# Patient Record
Sex: Male | Born: 1945 | Race: White | Hispanic: No | Marital: Married | Smoking: Former smoker
Health system: Southern US, Community
[De-identification: ages and names within clinical notes are randomized; demographics above are authoritative.]

## PROBLEM LIST (undated history)

## (undated) DIAGNOSIS — G4730 Sleep apnea, unspecified: Secondary | ICD-10-CM

## (undated) DIAGNOSIS — J310 Chronic rhinitis: Secondary | ICD-10-CM

## (undated) DIAGNOSIS — I2510 Atherosclerotic heart disease of native coronary artery without angina pectoris: Secondary | ICD-10-CM

## (undated) DIAGNOSIS — I82402 Acute embolism and thrombosis of unspecified deep veins of left lower extremity: Secondary | ICD-10-CM

## (undated) DIAGNOSIS — K219 Gastro-esophageal reflux disease without esophagitis: Secondary | ICD-10-CM

## (undated) DIAGNOSIS — N401 Enlarged prostate with lower urinary tract symptoms: Secondary | ICD-10-CM

## (undated) DIAGNOSIS — E119 Type 2 diabetes mellitus without complications: Secondary | ICD-10-CM

## (undated) DIAGNOSIS — N138 Other obstructive and reflux uropathy: Secondary | ICD-10-CM

## (undated) DIAGNOSIS — I480 Paroxysmal atrial fibrillation: Secondary | ICD-10-CM

## (undated) DIAGNOSIS — K5790 Diverticulosis of intestine, part unspecified, without perforation or abscess without bleeding: Secondary | ICD-10-CM

## (undated) DIAGNOSIS — Z9189 Other specified personal risk factors, not elsewhere classified: Secondary | ICD-10-CM

## (undated) DIAGNOSIS — I2692 Saddle embolus of pulmonary artery without acute cor pulmonale: Secondary | ICD-10-CM

## (undated) DIAGNOSIS — E669 Obesity, unspecified: Secondary | ICD-10-CM

## (undated) DIAGNOSIS — H269 Unspecified cataract: Secondary | ICD-10-CM

## (undated) DIAGNOSIS — Z1159 Encounter for screening for other viral diseases: Secondary | ICD-10-CM

## (undated) DIAGNOSIS — H903 Sensorineural hearing loss, bilateral: Secondary | ICD-10-CM

## (undated) DIAGNOSIS — R972 Elevated prostate specific antigen [PSA]: Secondary | ICD-10-CM

## (undated) DIAGNOSIS — G4733 Obstructive sleep apnea (adult) (pediatric): Secondary | ICD-10-CM

## (undated) DIAGNOSIS — M47816 Spondylosis without myelopathy or radiculopathy, lumbar region: Secondary | ICD-10-CM

## (undated) DIAGNOSIS — M1990 Unspecified osteoarthritis, unspecified site: Secondary | ICD-10-CM

## (undated) DIAGNOSIS — R911 Solitary pulmonary nodule: Secondary | ICD-10-CM

## (undated) DIAGNOSIS — E7800 Pure hypercholesterolemia, unspecified: Secondary | ICD-10-CM

## (undated) DIAGNOSIS — Z9989 Dependence on other enabling machines and devices: Secondary | ICD-10-CM

## (undated) DIAGNOSIS — I213 ST elevation (STEMI) myocardial infarction of unspecified site: Secondary | ICD-10-CM

## (undated) DIAGNOSIS — I10 Essential (primary) hypertension: Secondary | ICD-10-CM

## (undated) DIAGNOSIS — E66812 Obesity, class 2: Secondary | ICD-10-CM

## (undated) DIAGNOSIS — N402 Nodular prostate without lower urinary tract symptoms: Secondary | ICD-10-CM

## (undated) DIAGNOSIS — D126 Benign neoplasm of colon, unspecified: Secondary | ICD-10-CM

## (undated) HISTORY — DX: Dependence on other enabling machines and devices: Z99.89

## (undated) HISTORY — DX: Benign neoplasm of colon, unspecified: D12.6

## (undated) HISTORY — PX: TONSILLECTOMY: SUR1361

## (undated) HISTORY — DX: Sleep apnea, unspecified: G47.30

## (undated) HISTORY — DX: Saddle embolus of pulmonary artery without acute cor pulmonale: I26.92

## (undated) HISTORY — DX: Sensorineural hearing loss, bilateral: H90.3

## (undated) HISTORY — PX: FRACTURE SURGERY: SHX138

## (undated) HISTORY — DX: Benign prostatic hyperplasia with lower urinary tract symptoms: N13.8

## (undated) HISTORY — DX: Type 2 diabetes mellitus without complications: E11.9

## (undated) HISTORY — DX: Gastro-esophageal reflux disease without esophagitis: K21.9

## (undated) HISTORY — DX: Nodular prostate without lower urinary tract symptoms: N40.2

## (undated) HISTORY — DX: Unspecified cataract: H26.9

## (undated) HISTORY — DX: Acute embolism and thrombosis of unspecified deep veins of left lower extremity: I82.402

## (undated) HISTORY — DX: Solitary pulmonary nodule: R91.1

## (undated) HISTORY — PX: INGUINAL HERNIA REPAIR: SUR1180

## (undated) HISTORY — DX: Pure hypercholesterolemia, unspecified: E78.00

## (undated) HISTORY — PX: CORONARY ARTERY BYPASS GRAFT: SHX141

## (undated) HISTORY — DX: Paroxysmal atrial fibrillation: I48.0

## (undated) HISTORY — DX: Diverticulosis of intestine, part unspecified, without perforation or abscess without bleeding: K57.90

## (undated) HISTORY — DX: Obstructive sleep apnea (adult) (pediatric): G47.33

## (undated) HISTORY — DX: ST elevation (STEMI) myocardial infarction of unspecified site: I21.3

## (undated) HISTORY — PX: TRANSTHORACIC ECHOCARDIOGRAM: SHX275

## (undated) HISTORY — DX: Chronic rhinitis: J31.0

## (undated) HISTORY — DX: Spondylosis without myelopathy or radiculopathy, lumbar region: M47.816

## (undated) HISTORY — DX: Essential (primary) hypertension: I10

## (undated) HISTORY — DX: Other specified personal risk factors, not elsewhere classified: Z91.89

## (undated) HISTORY — DX: Obesity, class 2: E66.812

## (undated) HISTORY — DX: Other obstructive and reflux uropathy: N40.1

## (undated) HISTORY — DX: Unspecified osteoarthritis, unspecified site: M19.90

## (undated) HISTORY — DX: Atherosclerotic heart disease of native coronary artery without angina pectoris: I25.10

## (undated) HISTORY — DX: Obesity, unspecified: E66.9

## (undated) HISTORY — PX: COLONOSCOPY: SHX174

## (undated) HISTORY — DX: Elevated prostate specific antigen (PSA): R97.20

## (undated) HISTORY — PX: OTHER SURGICAL HISTORY: SHX169

## (undated) HISTORY — DX: Other specified personal risk factors, not elsewhere classified: Z11.59

---

## 2002-04-26 ENCOUNTER — Ambulatory Visit (HOSPITAL_COMMUNITY): Admission: RE | Admit: 2002-04-26 | Discharge: 2002-04-26 | Payer: Self-pay | Admitting: Gastroenterology

## 2003-08-03 ENCOUNTER — Encounter: Payer: Self-pay | Admitting: Emergency Medicine

## 2003-08-03 ENCOUNTER — Emergency Department (HOSPITAL_COMMUNITY): Admission: EM | Admit: 2003-08-03 | Discharge: 2003-08-03 | Payer: Self-pay | Admitting: Emergency Medicine

## 2003-08-12 ENCOUNTER — Ambulatory Visit (HOSPITAL_COMMUNITY): Admission: RE | Admit: 2003-08-12 | Discharge: 2003-08-13 | Payer: Self-pay | Admitting: Orthopedic Surgery

## 2003-10-09 ENCOUNTER — Encounter: Admission: RE | Admit: 2003-10-09 | Discharge: 2003-10-30 | Payer: Self-pay | Admitting: Orthopedic Surgery

## 2003-10-16 ENCOUNTER — Ambulatory Visit (HOSPITAL_BASED_OUTPATIENT_CLINIC_OR_DEPARTMENT_OTHER): Admission: RE | Admit: 2003-10-16 | Discharge: 2003-10-16 | Payer: Self-pay | Admitting: Internal Medicine

## 2003-10-16 ENCOUNTER — Encounter: Payer: Self-pay | Admitting: Internal Medicine

## 2004-03-11 ENCOUNTER — Ambulatory Visit (HOSPITAL_BASED_OUTPATIENT_CLINIC_OR_DEPARTMENT_OTHER): Admission: RE | Admit: 2004-03-11 | Discharge: 2004-03-11 | Payer: Self-pay | Admitting: General Surgery

## 2004-08-31 ENCOUNTER — Encounter
Admission: RE | Admit: 2004-08-31 | Discharge: 2004-11-29 | Payer: Self-pay | Admitting: Physical Medicine & Rehabilitation

## 2004-08-31 ENCOUNTER — Ambulatory Visit: Payer: Self-pay | Admitting: Physical Medicine & Rehabilitation

## 2004-10-01 ENCOUNTER — Ambulatory Visit (HOSPITAL_COMMUNITY): Admission: RE | Admit: 2004-10-01 | Discharge: 2004-10-01 | Payer: Self-pay | Admitting: Chiropractic Medicine

## 2005-03-01 ENCOUNTER — Ambulatory Visit (HOSPITAL_COMMUNITY): Admission: RE | Admit: 2005-03-01 | Discharge: 2005-03-01 | Payer: Self-pay | Admitting: Infectious Diseases

## 2006-07-10 ENCOUNTER — Ambulatory Visit: Payer: Self-pay | Admitting: Physical Medicine & Rehabilitation

## 2006-07-10 ENCOUNTER — Encounter
Admission: RE | Admit: 2006-07-10 | Discharge: 2006-10-08 | Payer: Self-pay | Admitting: Physical Medicine & Rehabilitation

## 2006-08-29 ENCOUNTER — Encounter
Admission: RE | Admit: 2006-08-29 | Discharge: 2006-09-06 | Payer: Self-pay | Admitting: Physical Medicine & Rehabilitation

## 2006-09-07 ENCOUNTER — Ambulatory Visit: Payer: Self-pay | Admitting: Physical Medicine & Rehabilitation

## 2006-10-30 ENCOUNTER — Encounter
Admission: RE | Admit: 2006-10-30 | Discharge: 2007-01-28 | Payer: Self-pay | Admitting: Physical Medicine & Rehabilitation

## 2009-11-04 ENCOUNTER — Ambulatory Visit: Payer: Self-pay | Admitting: Internal Medicine

## 2009-11-04 DIAGNOSIS — G4733 Obstructive sleep apnea (adult) (pediatric): Secondary | ICD-10-CM | POA: Insufficient documentation

## 2009-11-06 ENCOUNTER — Encounter: Payer: Self-pay | Admitting: Internal Medicine

## 2009-11-08 DIAGNOSIS — J310 Chronic rhinitis: Secondary | ICD-10-CM | POA: Insufficient documentation

## 2010-12-15 ENCOUNTER — Ambulatory Visit
Admission: RE | Admit: 2010-12-15 | Discharge: 2010-12-15 | Payer: Self-pay | Source: Home / Self Care | Attending: Internal Medicine | Admitting: Internal Medicine

## 2010-12-30 NOTE — Assessment & Plan Note (Signed)
Summary: per reminder letter/mhh   Primary Provider/Referring Provider:  Marinda Elk  CC:  Yearly Follow up visit-OSA and Rhinitis.Marland Kitchen  History of Present Illness:  History of Present Illness: Nov 19, 2009. 65yoM self referred for hx obstructive sleep apnea. Originally diagnosed 11/18.04, with moderate OSA, NPSG RDI 51/hr, loud snoring, desat to 83%. CPAP then titrated to 11 cwp, RDI 3.2. He has continued using his old machine at 65 cwp/ [facility], but now needs replacement. As near as he can tell this pressure remains effective, without breakthrough snoring. He may be tireder in daytime. Bedtime is 10-11PM, latency 5-15 minutes, waking 3-5 times before up 5-530AM. Weight is about the same. Tosses and turns, but denies complex parasomnias.  December 15, 2010- OSA Nurse-CC: Yearly Follow up visit-OSA and Rhinitis. CPAP is at 11 , [facility] Now 6 years with CPAP. Restles sleeper- often dislodges mask and the leaks disturb him. Not up a lot at night. Not told of snoring through. Old mattress. Cool bedroom.    Preventive Screening-Counseling & Management  Alcohol-Tobacco     Smoking Status: quit  Current Medications (verified): 1)  Cpap .... Ahc Set On 11 2)  Aspirin 81 Mg Tbec (Aspirin) .... Take 1 By Mouth Once Daily  Allergies (verified): No Known Drug Allergies  Past History:  Past Medical History: Last updated: 19-Nov-2009 Obstructive sleep Apnea- NPSG 10/16/03, RDI 51/hr Rhinitis  Past Surgical History: Last updated: November 19, 2009 Inguinal hernia Right clavicle fracture/ repair Tonsillectomy  Family History: Last updated: 11-19-2009 Father died- unknown cause mother-living  Social History: Last updated: 19-Nov-2009 Married, one son Patient states former smoker. Quit in 1960's [employer]  Risk Factors: Smoking Status: quit (12/15/2010)  Review of Systems      See HPI  The patient denies shortness of breath with activity,  shortness of breath at rest, productive cough, non-productive cough, coughing up blood, chest pain, irregular heartbeats, acid heartburn, indigestion, loss of appetite, weight change, abdominal pain, difficulty swallowing, sore throat, tooth/dental problems, headaches, nasal congestion/difficulty breathing through nose, sneezing, itching, ear ache, and change in color of mucus.    Vital Signs:  Patient profile:   65 year old male Weight:      243 pounds O2 Sat:      95 % on Room air Pulse rate:   75 / minute BP sitting:   124 / 80  (right arm) Cuff size:   large  Vitals Entered By: Reynaldo Minium CMA (December 15, 2010 9:20 AM)  O2 Flow:  Room air CC: Yearly Follow up visit-OSA and Rhinitis.   Physical Exam  Additional Exam:  General: A/Ox3; pleasant and cooperative, NAD, overweight, alert SKIN: no rash, lesions NODES: no lymphadenopathy HEENT: [place]/AT, EOM- WNL, Conjuctivae- clear, PERRLA, TM-WNL, Nose- septal deviation, Throat- clear and wnl, Mallampati  III, thick tongue NECK: Supple w/ fair ROM, JVD- none, normal carotid impulses w/o bruits Thyroid- normal to palpation CHEST: Clear to P&A HEART: RRR, no m/g/r heard ABDOMEN: overweight ZOX:WRUE, nl pulses, no edema  NEURO: Grossly intact to observation      Impression & Recommendations:  Problem # 1:  OBSTRUCTIVE SLEEP APNEA (ICD-327.23) Insomnia pattern separate from OSA. Restless sleeper  by nature. We don't identify restless legs  or inadequate CPAP.  We worked through options on mask, mattress,  etc.  We will let him try a short half-lfe sleep aide for occasional use.   Problem # 2:  RHINITIS (ICD-472.0) Assessment: Comment Only  Medications Added to Medication List This  Visit: 1)  Aspirin 81 Mg Tbec (Aspirin) .... Take 1 by mouth once daily 2)  Sonata 10 Mg Caps (Zaleplon) .Marland Kitchen.. 1 for sleep if needed  Other Orders: Est. Patient Level III (60454)  Patient Instructions: 1)  Please schedule a follow-up appointment  in 1 year. 2)  Try short half-life sleep med as an occasional option to help you sleep more soundly.  3)  We will continue CPAP at 11 Prescriptions: SONATA 10 MG CAPS (ZALEPLON) 1 for sleep if needed  #20 x 5   Entered and Authorized by:   Waymon Budge MD   Signed by:   Waymon Budge MD on 12/15/2010   Method used:   Print then Give to Patient   RxID:   [id number]

## 2011-04-15 NOTE — Op Note (Signed)
NAME:  Wayne Lamb, Wayne Lamb                          ACCOUNT NO.:  [id number]   MEDICAL RECORD NO.:  [id number]                   PATIENT TYPE:  AMB   LOCATION:  DSC                                  FACILITY:  [facility]   PHYSICIAN:  Jimmye Norman III, M.D.               DATE OF BIRTH:  09-Jun-1946   DATE OF PROCEDURE:  03/11/2004  DATE OF DISCHARGE:                                 OPERATIVE REPORT   PREOPERATIVE DIAGNOSIS:  Left inguinal hernia.   POSTOPERATIVE DIAGNOSIS:  Large direct left inguinal hernia without indirect  component.   PROCEDURE:  Left inguinal hernia repair with mesh.   SURGEON:  Jimmye Norman, M.D.   ASSISTANT:  None.   ANESTHESIA:  General with laryngeal airway.   ESTIMATED BLOOD LOSS:  Less than 20 mL.   COMPLICATIONS:  None.   CONDITION:  Stable.   FINDINGS:  The patient had a large direct hernia without an indirect  component.   INDICATIONS FOR PROCEDURE:  The patient is a 65 year old with a large left  inguinal hernia who comes in now for elective repair.   OPERATION:  The patient was taken to the operating room and placed on the  table in supine position.  After an adequate general laryngeal airway  anesthetic was administered, he was prepped and draped in the usual sterile  manner exposing his left groin.  A 4.5 cm transverse curvilinear incision  was made at the level of the superficial ring.  It was taken down to and  through the subcu tissue down through Scarpa's fascia to the external  oblique fascia.  It was easily seen a large hernia sac protruding through  the superficial ring.  We opened up the external oblique fascia through the  superficial ring using 15 blade and Metzenbaum scissors.  We isolated the  spermatic cord away from the large sac which was medial to the cord, itself.  We dissected down to the base of the cord where no indirect sac was noted.  We cleaned out the reflected portion of the inguinal ligament and also the  conjoined tendon  so that our repair could be clean.  We imbricated the  direct sac onto itself by pushing it back into the canal and then using  interrupted 0 Ethibond sutures to keep it tucked in place.  We then  oversewed an oval piece of mesh measuring approximately 5 by 3 cm in size to  the conjoined tendon anteromedially and the reflected portion of the  inguinal ligament inferolaterally.  This was done using a running 0 Prolene  suture.  The hernia sac remained intact and repair remained intact after  suturing it and we tied off both Prolenes at the internal ring by the cord.  The ilioinguinal nerve was transected as we opened the superficial ring  through the external oblique fascia.  We dissected it down to the tubercle  and also  at its base.  There was no entrapment at the internal ring.  Once  we had the mesh which had been soaked in antibiotic solution implanted, we  placed the cord back into the canal and then reapproximated the external  oblique fascia using a running 3-0 Vicryl suture.  Once this was done,  Scarpa's fascia was reapproximated using  interrupted 3-0 Vicryl sutures and then the skin was closed using a running  subcuticular stitch of 4-0 Prolene.  0.5% Marcaine without epinephrine was  injected into the subcu and also at the anterior superior iliac spine in a  fan pattern.  All needle counts, sponge counts, and instrument counts were  correct.  A sterile dressing was applied.                                               Kathrin Ruddy, M.D.    JW/MEDQ  D:  03/11/2004  T:  03/11/2004  Job:  [id number]

## 2011-04-15 NOTE — Assessment & Plan Note (Signed)
[facility] NOTE   DATE OF VISIT:  August 08, 2006.  Date of previous visit July 10, 2006.   HISTORY:  Patient is a 65 year old male with left sided low back pain,  lumbar spinal stenosis, facet arthropathy, left L4 radiculopathy.   He has not been able to exercise much because of his travel schedule and  states that he has had some exacerbation of pain and is at a 5-7/10 level,  which is actually in the same range as prior.  His sleep is good.  Pain is  worse during the daytime.  He has a warm-up phase where first thing in the  morning he feels stiff but then feels better.  His left lower extremity pain  comes and goes and wraps around to the side of the leg.   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   PHYSICAL EXAMINATION:  VITAL SIGNS:  Blood pressure 143/79, pulse 71,  respirations 18, oxygen saturation 94% on room air.  MUSCULOSKELETAL:  Back has mild tenderness to palpation of lumbar  paraspinal's. He has good spine range of motion, flexion and extension of  about 75%, however, he does have some pain going down his left leg laterally  with forward flexion.   He has good hip internal and external rotation bilaterally.  Normal strength  in the lower extremities. He has normal deep tendon reflexes.  His gait is  without evidence of toe drag or knee instability.   IMPRESSION:  1. Lumbar spinal stenosis.  2. Lumbar facet arthropathy.  3. Left L4 radiculopathy.   PLAN:  1. Given his symptomatology is slightly increased, despite being on      ibuprofen 800 mg t.i.d., we will have him stop this and take a Medrol      Dosepak.  2. Start physical therapy lumbar stabilization program, four visits.  3. Should above not be helpful, consider lumbar epidural steroid      injection, L4 transforaminal.  4. Encourage establishment of a regular exercise program including aerobic      conditioning and core stabilization  exercises.      Erick Colace, M.D.  Electronically Signed     AEK/MedQ  D:  08/08/2006 10:30:41  T:  08/08/2006 16:09:29  Job #:  [id number]

## 2011-04-15 NOTE — Assessment & Plan Note (Signed)
PAIN AND REHABILITATION FOLLOWUP NOTE   DATE OF VISIT:  July 10, 2006.   SUBJECTIVE:  [name] is a 65 year old male who I last saw August 31, 2004 who has had left sided low back pain as well as pain going down his  left leg into his foot.  I saw him and sent him to physical therapy. He did  well and followed up with chiropractic.  He started an exercise program to  try to lose weight.  He started a spin class as well as Systems analyst  class with some overhead activities with weights on a Swedish ball.  Has  done some free weights with biceps and other machines as well, leg presses.  He thinks that he has had some exacerbation of pain because of this.  He  really has not taken much more than non prescription strength ibuprofen.   He had an MRI of the lumbar spine October 01, 2004 showing advanced facet  degenerative changes at L3-L4, L4-L5, L5-S1.  He did have lateral recess  stenosis of L4-L5, shallow disc protrusion and some encroachment upon left  L4.   He has had no change in symptomatology since that time.   His pain is graded at a 6-7 out of 10 and improves with sitting, worse with  standing. 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   SOCIAL HISTORY:  Occasional wine, no smoking.   PHYSICAL EXAMINATION:  VITAL SIGNS:  Blood pressure is 119/81, pulse is 68,  respirations 15, oxygen saturation 96% on room air.  GENERAL APPEARANCE:  No acute distress.  Mood and affect appropriate.  Alert  and oriented x3.  Gait is normal.  MUSCULOSKELETAL:  He has no tenderness to palpation over the lumbar spine.  He does not have any pain with extension or flexion of the spine.  His  favors test is negative.  He has no sensory loss to light touch or pinprick.  He has normal deep tendon reflexes.  Normal gait.  He is able to toe walk  and heel walk.   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   Essentially negative physical examination at this time.   PLAN:  We went over his exercise program.  Have advised that he does  everything in a seated position as much as possible.  He can do some  stationary bicycling but avoid walking or running.   MEDICATIONS:  In terms of medications, will put him on ibuprofen 800 mg  t.i.d. with food.   If he is not much better the next time I see him I would send him back to  physical therapy, review exercise program and also start a Medrol Dosepak  and, failing this, would proceed on to a transepidural steroid injection  left L4.      Erick Colace, M.D.  Electronically Signed     AEK/MedQ  D:  07/10/2006 17:03:32  T:  07/11/2006 09:20:23  Job #:  [id number]

## 2011-04-15 NOTE — Assessment & Plan Note (Signed)
DATE OF LAST VISIT:  August 08, 2006.   A 65 year old male with lumbar spinal stenosis with facet arthropathy, L4  radiculopathy on the left.  He has now got into physical therapy and has had  4 sessions plus some home exercises, and he feels like this is going quite  well.  His pain level last was 5-7/10, now it is down 3-5/10.  He states  that the postural draining has been very helpful for him, and this has  helped as much as anything.  The Medrol Dosepak had some temporary relief.  It was not anymore effective than the ibuprofen.  I have noted that he is no  longer using ibuprofen, whereas he was consistently last visit.   His pain is not interfering with sleep.  It is more in the back than it is  in the lower extremity.  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   PHYSICAL EXAMINATION:  VITAL SIGNS:  His blood pressure is 150/77, pulse 78,  respiratory rate 16, O2 SAT 95% on room air.  GENERAL:  No acute distress and affect appropriate.  BACK:  Had no tenderness to palpation.  He has pain when he goes straight-  legged forward flexion, but this is diminished when he comes back up.  He  does have pain with extension as well and has about 50% range of extension  where it is 100% full flexion.   Gait is without any toe drag or knee instability.   IMPRESSION:  1. Lumbar spinal stenosis.  2. Lumbar facet arthropathy.  3. Left L4 radiculopathy.   He does have some nerve retention signs but overall doing well.   PLAN:  1. Continue his home exercise program as delineated by physical therapy.  2. He had an exacerbation of his left lower extremity pain.  Will do a      left L4 trans-foraminal and epidural steroid injection.  3. Use p.r.n. ibuprofen for his bad days.  4. Will see him back in approximately 2 months.      Erick Colace, M.D.  Electronically Signed     AEK/MedQ  D:  09/07/2006 13:27:36  T:  09/09/2006 00:38:29   Job #:  [id number]   cc:   Andria Frames  Fax: [phone]   Doris Cheadle. Foy Guadalajara, M.D.  Fax: [phone]
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
## 2011-04-15 NOTE — Group Therapy Note (Signed)
INDICATIONS:  Wayne Lamb is a 65 year old male with a three-month history  of gradually increasing, insidious onset of left-sided low back pain  radiating into the left foot.  His back pain is actually worse than the leg  pain.  The leg pain is more or less described as a numbness.  His back pain  is graded at 5 out of 10 on the average, ranging from 1 to 8.  His pain  improves with rest and is made worse with walking and bending.  He started  out feeling it mostly in the morning but now it has progressed to being felt  most of the day.   He has tried Motrin 600 mg q.6h. for a month without much improvement.  He  does not take any Tylenol.   PAST MEDICAL HISTORY:  1.  Significant for hernia, status post repair February 2005.  2.  Repair of broken clavicle of October 2004.  3.  He also has sleep apnea.   SOCIAL HISTORY:  Married.  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   REVIEW OF SYMPTOMS:  Numbness left leg.  He has some headaches which he has  from time to time but more so lately.   PHYSICAL EXAMINATION:  VITAL SIGNS:  Blood pressure 165/84, pulse 81,  respirations 16.  O2 saturation 95% on room air.  NEUROLOGICAL:  Gait is normal.  He is able to toe walk and heel walk.  His  affect is bright and alert and appears normal.  BACK:  No tenderness to palpation.  He has forward flexion at about 75%  normal, extension full, lateral bending and twisting are full.  He has pain  in his back with straight leg raise testing and a negative bowstring sign.  He has normal sensation, normal range of motion of bilateral lower  extremities with exception of the hips that have decreased internal and  external rotation.  He has no pain to palpation to bilateral lower  extremities.  Deep tendon reflexes are normal.   IMPRESSION:  1.  Sciatica.  2.  Lumbago, probable degenerative disc likely disc herniation without      radiculopathy.   PLAN:  1.  Initiate  physical therapy given the length of time that this has been      going on.  2.  Trial of Neurontin.  Consider Lidoderm.  3.  If not any better after above in one month, consider lumbar MRI.      AEK/MedQ  D:  08/31/2004 13:47:38  T:  08/31/2004 14:12:44  Job #:  [id number]   cc:   Molly Maduro L. Foy Guadalajara, M.D.  [address] [place]  [place] [address]  Fax: [phone]

## 2011-12-13 ENCOUNTER — Encounter: Payer: Self-pay | Admitting: Internal Medicine

## 2011-12-14 ENCOUNTER — Ambulatory Visit: Payer: Self-pay | Admitting: Internal Medicine

## 2011-12-30 ENCOUNTER — Encounter: Payer: Self-pay | Admitting: Internal Medicine

## 2012-01-02 ENCOUNTER — Encounter: Payer: Self-pay | Admitting: Internal Medicine

## 2012-01-02 ENCOUNTER — Ambulatory Visit (INDEPENDENT_AMBULATORY_CARE_PROVIDER_SITE_OTHER): Payer: 59 | Admitting: Internal Medicine

## 2012-01-02 VITALS — BP 124/66 | HR 73 | Ht 70.0 in | Wt 251.0 lb

## 2012-01-02 DIAGNOSIS — G4733 Obstructive sleep apnea (adult) (pediatric): Secondary | ICD-10-CM

## 2012-01-02 NOTE — Patient Instructions (Signed)
Order- replacement CPAP mask of choice and supplies.   Dx OSA

## 2012-01-02 NOTE — Progress Notes (Signed)
01/02/12- 18 yoM former smoker followed for OSA , rhinitis LOV- 12/15/10 One-year followup. He describes very good compliance and control with CPAP still at 11 CWP/Advanced. He is now retired with more flexible sleep schedule. Needs new mask.  ROS-see HPI Constitutional:   No-   weight loss, night sweats, fevers, chills, fatigue, lassitude. HEENT:   No-  headaches, difficulty swallowing, tooth/dental problems, sore throat,       No-  sneezing, itching, ear ache, nasal congestion, post nasal drip,  CV:  No-   chest pain, orthopnea, PND, swelling in lower extremities, anasarca, dizziness, palpitations Resp: No-   shortness of breath with exertion or at rest.              No-   productive cough,  No non-productive cough,  No- coughing up of blood.              No-   change in color of mucus.  No- wheezing.   Skin: No-   rash or lesions. GI:  No-   heartburn, indigestion, abdominal pain, nausea, vomiting, diarrhea,                 change in bowel habits, loss of appetite GU:  MS:  No-   joint pain or swelling.  No- decreased range of motion.  No- back pain. Neuro-     nothing unusual Psych:  No- change in mood or affect. No depression or anxiety.  No memory loss.  OBJ- Physical Exam General- Alert, Oriented, Affect-appropriate, Distress- none acute, looks well, a little overweight Skin- rash-none, lesions- none, excoriation- none Lymphadenopathy- none Head- atraumatic            Eyes- Gross vision intact, PERRLA, conjunctivae and secretions clear            Ears- Hearing, canals-normal            Nose- Clear, no-Septal dev, mucus, polyps, erosion, perforation             Throat- Mallampati II-III , mucosa clear , drainage- none, tonsils- atrophic Neck- flexible , trachea midline, no stridor , thyroid nl, carotid no bruit Chest - symmetrical excursion , unlabored           Heart/CV- RRR , no murmur , no gallop  , no rub, nl s1 s2                           - JVD- none , edema- none, stasis  changes- none, varices- none           Lung- clear to P&A, wheeze- none, cough- none , dullness-none, rub- none           Chest wall-  Abd- Br/ Gen/ Rectal- Not done, not indicated Extrem- cyanosis- none, clubbing, none, atrophy- none, strength- nl Neuro- grossly intact to observation

## 2012-01-05 NOTE — Assessment & Plan Note (Signed)
Good compliance and control. We are ordering a replacement CPAP mask. Comfort and medical goals were discussed.

## 2012-11-28 DIAGNOSIS — D126 Benign neoplasm of colon, unspecified: Secondary | ICD-10-CM

## 2012-11-28 HISTORY — DX: Benign neoplasm of colon, unspecified: D12.6

## 2013-01-01 ENCOUNTER — Ambulatory Visit (INDEPENDENT_AMBULATORY_CARE_PROVIDER_SITE_OTHER): Payer: 59 | Admitting: Internal Medicine

## 2013-01-01 ENCOUNTER — Encounter: Payer: Self-pay | Admitting: Internal Medicine

## 2013-01-01 VITALS — BP 150/86 | HR 64 | Ht 70.0 in | Wt 244.6 lb

## 2013-01-01 DIAGNOSIS — G4733 Obstructive sleep apnea (adult) (pediatric): Secondary | ICD-10-CM

## 2013-01-01 NOTE — Progress Notes (Signed)
01/02/12- 49 yoM former smoker followed for OSA , rhinitis LOV- 12/15/10 One-year followup. He describes very good compliance and control with CPAP still at 11 CWP/Advanced. He is now retired with more flexible sleep schedule. Needs new mask.  01/01/13 30 yoM former smoker followed for OSA , rhinitis FOLLOWS FOR: wears CPAP 11/Advanced every night about 7-8 hours and pressure working well for patient; ? new mask at this time He likes his current mask and machine. We discussed supplies and replacement when appropriate. He no longer feels need for sleeping pill. Sleeps well. Wife does not report that he snores through the machine.  ROS-see HPI Constitutional:   No-   weight loss, night sweats, fevers, chills, fatigue, lassitude. HEENT:   No-  headaches, difficulty swallowing, tooth/dental problems, sore throat,       No-  sneezing, itching, ear ache, nasal congestion, post nasal drip,  CV:  No-   chest pain, orthopnea, PND, swelling in lower extremities, anasarca, dizziness, palpitations Resp: No-   shortness of breath with exertion or at rest.              No-   productive cough,  No non-productive cough,  No- coughing up of blood.              No-   change in color of mucus.  No- wheezing.   Skin: No-   rash or lesions. GI:  No-   heartburn, indigestion, abdominal pain, nausea, vomiting, GU:  MS:  No-   joint pain or swelling.  Neuro-     nothing unusual Psych:  No- change in mood or affect. No depression or anxiety.  No memory loss.  OBJ- Physical Exam General- Alert, Oriented, Affect-appropriate, Distress- none acute, looks well,  overweight Skin- rash-none, lesions- none, excoriation- none Lymphadenopathy- none Head- atraumatic            Eyes- Gross vision intact, PERRLA, conjunctivae and secretions clear            Ears- Hearing, canals-normal            Nose- Clear, no-Septal dev, mucus, polyps, erosion, perforation             Throat- Mallampati II-III , mucosa clear , drainage-  none, tonsils- atrophic Neck- flexible , trachea midline, no stridor , thyroid nl, carotid no bruit Chest - symmetrical excursion , unlabored           Heart/CV- RRR , no murmur , no gallop  , no rub, nl s1 s2                           - JVD- none , edema- none, stasis changes- none, varices- none           Lung- clear to P&A, wheeze- none, cough- none , dullness-none, rub- none           Chest wall-  Abd- Br/ Gen/ Rectal- Not done, not indicated Extrem- cyanosis- none, clubbing, none, atrophy- none, strength- nl Neuro- grossly intact to observation

## 2013-01-01 NOTE — Patient Instructions (Addendum)
Please call as needed  We can continue CPAP 11/ Advanced

## 2013-01-10 NOTE — Assessment & Plan Note (Signed)
Good compliance and control. No changes needed. We discussed replacement when needed.

## 2013-02-14 LAB — HM COLONOSCOPY

## 2013-03-27 ENCOUNTER — Telehealth: Payer: Self-pay | Admitting: Internal Medicine

## 2013-03-27 NOTE — Telephone Encounter (Signed)
Returning call can be reached at [phone].Wayne Lamb

## 2013-03-27 NOTE — Telephone Encounter (Signed)
[email or web address] x1 

## 2013-03-27 NOTE — Telephone Encounter (Signed)
i spoke with pt and he stated he did not call and leave a message for [place]. He stated he went to [facility] and was advised his insurance was not in network with them and he was being changed over to [place]. [facility]'s do we need to put in an order for this or does the DME automatically do the switch? Please advise thanks

## 2013-03-28 NOTE — Telephone Encounter (Signed)
This has been taken care of already thanks Tobe Sos

## 2013-04-01 ENCOUNTER — Telehealth: Payer: Self-pay | Admitting: Internal Medicine

## 2013-04-01 DIAGNOSIS — G4733 Obstructive sleep apnea (adult) (pediatric): Secondary | ICD-10-CM

## 2013-04-01 NOTE — Telephone Encounter (Signed)
Order has been sent to [facility].  

## 2013-04-01 NOTE — Telephone Encounter (Signed)
Order- DME - replacement CPAP supplies   Dx OSA

## 2013-04-01 NOTE — Telephone Encounter (Signed)
Last OV 01/01/13 Was told to follow up in 1 year.  Please advise if it is okay to send an order for new CPAP supplies for the pt. Thanks.  No Known Allergies

## 2014-11-28 DIAGNOSIS — N402 Nodular prostate without lower urinary tract symptoms: Secondary | ICD-10-CM

## 2014-11-28 HISTORY — DX: Nodular prostate without lower urinary tract symptoms: N40.2

## 2015-02-16 LAB — PSA: PSA: 4.83

## 2015-06-03 ENCOUNTER — Encounter: Payer: Self-pay | Admitting: Family Medicine

## 2015-06-04 ENCOUNTER — Other Ambulatory Visit: Payer: Self-pay | Admitting: Urology

## 2015-06-04 DIAGNOSIS — R972 Elevated prostate specific antigen [PSA]: Secondary | ICD-10-CM

## 2015-06-25 ENCOUNTER — Encounter: Payer: Self-pay | Admitting: Internal Medicine

## 2015-06-25 ENCOUNTER — Ambulatory Visit (INDEPENDENT_AMBULATORY_CARE_PROVIDER_SITE_OTHER): Payer: PPO | Admitting: Internal Medicine

## 2015-06-25 VITALS — BP 116/74 | HR 58 | Ht 69.0 in | Wt 234.0 lb

## 2015-06-25 DIAGNOSIS — G4733 Obstructive sleep apnea (adult) (pediatric): Secondary | ICD-10-CM

## 2015-06-25 NOTE — Patient Instructions (Signed)
Order- DME Advanced  Replacement for old, worn out CPAP machine    Change to Auto 8--15, mask of choice, humidifier, supplies    Dx OSA

## 2015-06-25 NOTE — Progress Notes (Signed)
01/02/12- 2 yoM former smoker followed for OSA , rhinitis LOV- 12/15/10 One-year followup. He describes very good compliance and control with CPAP still at 11 CWP/Advanced. He is now retired with more flexible sleep schedule. Needs new mask.  01/01/13 89 yoM former smoker followed for OSA , rhinitis FOLLOWS FOR: wears CPAP 11/Advanced every night about 7-8 hours and pressure working well for patient; ? new mask at this time He likes his current mask and machine. We discussed supplies and replacement when appropriate. He no longer feels need for sleeping pill. Sleeps well. Wife does not report that he snores through the machine.  06/25/15- 68 yoM former smoker followed for OSA , rhinitis Report: CPAP 11 /Advanced needs replacement, States he wears 6-8 hr per night, DME [facility] CPAP machine wore out. He definitely misses it. Has lost weight which helps.  ROS-see HPI Constitutional:  + weight loss, night sweats, fevers, chills, fatigue, lassitude. HEENT:   No-  headaches, difficulty swallowing, tooth/dental problems, sore throat,       No-  sneezing, itching, ear ache, nasal congestion, post nasal drip,  CV:  No-   chest pain, orthopnea, PND, swelling in lower extremities, anasarca, dizziness, palpitations Resp: No-   shortness of breath with exertion or at rest.              No-   productive cough,  No non-productive cough,  No- coughing up of blood.              No-   change in color of mucus.  No- wheezing.   Skin: No-   rash or lesions. GI:  No-   heartburn, indigestion, abdominal pain, nausea, vomiting, GU:  MS:  No-   joint pain or swelling.  Neuro-     nothing unusual Psych:  No- change in mood or affect. No depression or anxiety.  No memory loss.  OBJ- Physical Exam General- Alert, Oriented, Affect-appropriate, Distress- none acute, looks well,  + still overweight Skin- rash-none, lesions- none, excoriation- none Lymphadenopathy- none Head- atraumatic            Eyes- Gross vision  intact, PERRLA, conjunctivae and secretions clear            Ears- Hearing, canals-normal            Nose- Clear, no-Septal dev, mucus, polyps, erosion, perforation             Throat- Mallampati II-III , mucosa clear , drainage- none, tonsils- atrophic Neck- flexible , trachea midline, no stridor , thyroid nl, carotid no bruit Chest - symmetrical excursion , unlabored           Heart/CV- RRR , no murmur , no gallop  , no rub, nl s1 s2                           - JVD- none , edema- none, stasis changes- none, varices- none           Lung- clear to P&A, wheeze- none, cough- none , dullness-none, rub- none           Chest wall-  Abd- Br/ Gen/ Rectal- Not done, not indicated Extrem- cyanosis- none, clubbing, none, atrophy- none, strength- nl Neuro- grossly intact to observation

## 2015-07-27 ENCOUNTER — Ambulatory Visit (HOSPITAL_COMMUNITY)
Admission: RE | Admit: 2015-07-27 | Discharge: 2015-07-27 | Disposition: A | Discharge status: Home / Self Care | Payer: PPO | Source: Ambulatory Visit | Attending: Urology | Admitting: Urology

## 2015-07-27 DIAGNOSIS — R972 Elevated prostate specific antigen [PSA]: Secondary | ICD-10-CM

## 2015-07-27 LAB — POCT I-STAT CREATININE: Creatinine, Ser: 1 mg/dL (ref 0.61–1.24)

## 2015-07-27 MED ORDER — GADOBENATE DIMEGLUMINE 529 MG/ML IV SOLN
20.0000 mL | Freq: Once | INTRAVENOUS | Status: AC | PRN
  Administered 2015-07-27: 20 mL via INTRAVENOUS

## 2015-08-06 NOTE — Assessment & Plan Note (Signed)
Medical utility of CPAP emphasized by how he misses it since his machine broke. Compliance was very good. Plan-replacement CPAP machine/Advanced, changing to auto 8-15

## 2016-02-25 DIAGNOSIS — Z1389 Encounter for screening for other disorder: Secondary | ICD-10-CM | POA: Diagnosis not present

## 2016-02-25 DIAGNOSIS — E7800 Pure hypercholesterolemia, unspecified: Secondary | ICD-10-CM | POA: Diagnosis not present

## 2016-02-25 DIAGNOSIS — Z0000 Encounter for general adult medical examination without abnormal findings: Secondary | ICD-10-CM | POA: Diagnosis not present

## 2016-02-25 DIAGNOSIS — Z86010 Personal history of colonic polyps: Secondary | ICD-10-CM | POA: Diagnosis not present

## 2016-03-03 DIAGNOSIS — R7301 Impaired fasting glucose: Secondary | ICD-10-CM | POA: Diagnosis not present

## 2016-03-28 DIAGNOSIS — E119 Type 2 diabetes mellitus without complications: Secondary | ICD-10-CM

## 2016-03-28 HISTORY — DX: Type 2 diabetes mellitus without complications: E11.9

## 2016-04-18 DIAGNOSIS — E119 Type 2 diabetes mellitus without complications: Secondary | ICD-10-CM | POA: Diagnosis not present

## 2016-06-08 ENCOUNTER — Encounter: Payer: Self-pay | Admitting: Skilled Nursing Facility1

## 2016-06-08 ENCOUNTER — Encounter: Payer: PPO | Attending: Internal Medicine | Admitting: Skilled Nursing Facility1

## 2016-06-08 DIAGNOSIS — E1165 Type 2 diabetes mellitus with hyperglycemia: Secondary | ICD-10-CM | POA: Insufficient documentation

## 2016-06-08 DIAGNOSIS — Z713 Dietary counseling and surveillance: Secondary | ICD-10-CM | POA: Diagnosis not present

## 2016-06-08 DIAGNOSIS — E119 Type 2 diabetes mellitus without complications: Secondary | ICD-10-CM

## 2016-06-08 NOTE — Progress Notes (Signed)
  Medical Nutrition Therapy:  Appt start time: 1400 end time:  1500.   Assessment:  Primary concerns today: referred for type 2 diabetes. Pt states he has Lost 10-12 pounds since working out for a year with a goal to lose 60 pounds. Pt states he does not know why he was referred for diabetes when he does not have diabetes (pts A1C 5.9 and fasting glucose 130, cholesterol 245, triglycerides 180, LDL cholesterol 166). Pt states he does not understand why his labs are high when he has been plenty active for over a year.  Preferred Learning Style:  No preference indicated   Learning Readiness:   Ready MEDICATIONS: See List   DIETARY INTAKE:  Usual eating pattern includes 2 meals and 2 snacks per day.  Everyday foods include none stated.  Avoided foods include carbohydrate containing foods.    24-hr recall:  B ( AM): protein bar and protein shake-----cereal and banana----eggs Snk ( AM):  L ( PM):  Snk ( PM): sweets D ( PM): going out and getting sandwiches Snk ( PM): peanut nut [employer] Beverages: water, ice tea  Usual physical activity: 60 minutes 2-3 times a week: kettlebells; play golf 2-3 times a week  Estimated energy needs: 2000 calories 225 g carbohydrates 150 g protein 56 g fat  Progress Towards Goal(s):  In progress.   Nutritional Diagnosis:  NB-1.1 Food and nutrition-related knowledge deficit As related to no prior nutrition education from a nutrition professional.  As evidenced by pt report, notions of carbohydrates, and 24 hr recall.    Intervention:  Nutrition counseling for hyperglycemia. Dietitian educated the pt on carbohydrates, balanced meals, diabetes, glucose metabolism. Goals: Balance your meals  Teaching Method Utilized:  Visual Auditory Hands on  Handouts given during visit include:  Snack sheet  Living well with diabetes  Barriers to learning/adherence to lifestyle change: none identified  Demonstrated degree of understanding via:  Teach  Back   Monitoring/Evaluation:  Dietary intake, exercise, lipid labs, A1C, fasting glucose, and body weight prn.

## 2016-06-09 DIAGNOSIS — H10413 Chronic giant papillary conjunctivitis, bilateral: Secondary | ICD-10-CM | POA: Diagnosis not present

## 2016-06-09 DIAGNOSIS — H2513 Age-related nuclear cataract, bilateral: Secondary | ICD-10-CM | POA: Diagnosis not present

## 2016-06-09 DIAGNOSIS — H02834 Dermatochalasis of left upper eyelid: Secondary | ICD-10-CM | POA: Diagnosis not present

## 2016-06-09 DIAGNOSIS — H02831 Dermatochalasis of right upper eyelid: Secondary | ICD-10-CM | POA: Diagnosis not present

## 2016-06-09 DIAGNOSIS — H04123 Dry eye syndrome of bilateral lacrimal glands: Secondary | ICD-10-CM | POA: Diagnosis not present

## 2016-06-10 ENCOUNTER — Encounter: Payer: Self-pay | Admitting: Internal Medicine

## 2016-06-10 ENCOUNTER — Ambulatory Visit (INDEPENDENT_AMBULATORY_CARE_PROVIDER_SITE_OTHER): Payer: PPO | Admitting: Internal Medicine

## 2016-06-10 VITALS — BP 150/92 | HR 63 | Ht 69.0 in | Wt 234.6 lb

## 2016-06-10 DIAGNOSIS — G4733 Obstructive sleep apnea (adult) (pediatric): Secondary | ICD-10-CM

## 2016-06-10 NOTE — Patient Instructions (Signed)
Order- DME Advanced continue CPAP auto 5-20, mask of choice, humidifier, supplies, AirView                       Please replace mask of choice    Dx OSA

## 2016-06-10 NOTE — Progress Notes (Signed)
01/02/12- 67 yoM former smoker followed for OSA , rhinitis LOV- 12/15/10 One-year followup. He describes very good compliance and control with CPAP still at 11 CWP/Advanced. He is now retired with more flexible sleep schedule. Needs new mask.  01/01/13 57 yoM former smoker followed for OSA , rhinitis FOLLOWS FOR: wears CPAP 11/Advanced every night about 7-8 hours and pressure working well for patient; ? new mask at this time He likes his current mask and machine. We discussed supplies and replacement when appropriate. He no longer feels need for sleeping pill. Sleeps well. Wife does not report that he snores through the machine.  06/25/15- 68 yoM former smoker followed for OSA , rhinitis Report: CPAP 11 /Advanced needs replacement, States he wears 6-8 hr per night, DME [facility] CPAP machine wore out. He definitely misses it. Has lost weight which helps.  06/10/2016-70 year old male former smoker followed for OSA, rhinitis NPSG 10/16/03- AHI (RDI) 51/ hr, desat to 83%, body weight 220 lbs CPAP auto 5-20/Advanced FOLLOW FOR: doing well on CPAP. Would like to discuss new mask.   He has not found a mask that suits him because he turns a lot and sleep apnea may all dislodged. Pressures are comfortable. We discussed oral appliances as an alternative. Weight loss remains at goal.  ROS-see HPI Constitutional:  + weight loss, night sweats, fevers, chills, fatigue, lassitude. HEENT:   No-  headaches, difficulty swallowing, tooth/dental problems, sore throat,       No-  sneezing, itching, ear ache, nasal congestion, post nasal drip,  CV:  No-   chest pain, orthopnea, PND, swelling in lower extremities, anasarca, dizziness, palpitations Resp: No-   shortness of breath with exertion or at rest.              No-   productive cough,  No non-productive cough,  No- coughing up of blood.              No-   change in color of mucus.  No- wheezing.   Skin: No-   rash or lesions. GI:  No-   heartburn, indigestion,  abdominal pain, nausea, vomiting, GU:  MS:  No-   joint pain or swelling.  Neuro-     nothing unusual Psych:  No- change in mood or affect. No depression or anxiety.  No memory loss.  OBJ- Physical Exam General- Alert, Oriented, Affect-appropriate, Distress- none acute, looks well,  + still overweight Skin- rash-none, lesions- none, excoriation- none Lymphadenopathy- none Head- atraumatic            Eyes- Gross vision intact, PERRLA, conjunctivae and secretions clear            Ears- Hearing, canals-normal            Nose- Clear, no-Septal dev, mucus, polyps, erosion, perforation             Throat- Mallampati II-III , mucosa clear , drainage- none, tonsils- atrophic Neck- flexible , trachea midline, no stridor , thyroid nl, carotid no bruit Chest - symmetrical excursion , unlabored           Heart/CV- RRR , no murmur , no gallop  , no rub, nl s1 s2                           - JVD- none , edema- none, stasis changes- none, varices- none           Lung- clear to P&A, wheeze- none, cough-  none , dullness-none, rub- none           Chest wall-  Abd- Br/ Gen/ Rectal- Not done, not indicated Extrem- cyanosis- none, clubbing, none, atrophy- none, strength- nl Neuro- grossly intact to observation

## 2016-06-10 NOTE — Assessment & Plan Note (Signed)
He does not notice pressures either too high or too low. Download shows excellent compliance and control with pressure usually between 10 and 11. Discussed alternatives to CPAP and offered referral to consider oral appliance-declined at this time. He may decide to try a memory foam mask

## 2016-06-23 DIAGNOSIS — G4733 Obstructive sleep apnea (adult) (pediatric): Secondary | ICD-10-CM | POA: Diagnosis not present

## 2016-06-27 ENCOUNTER — Ambulatory Visit: Payer: PPO | Admitting: *Deleted

## 2016-07-27 DIAGNOSIS — E669 Obesity, unspecified: Secondary | ICD-10-CM | POA: Diagnosis not present

## 2016-07-27 DIAGNOSIS — Z23 Encounter for immunization: Secondary | ICD-10-CM | POA: Diagnosis not present

## 2016-07-27 DIAGNOSIS — R030 Elevated blood-pressure reading, without diagnosis of hypertension: Secondary | ICD-10-CM | POA: Diagnosis not present

## 2016-07-27 DIAGNOSIS — E119 Type 2 diabetes mellitus without complications: Secondary | ICD-10-CM | POA: Diagnosis not present

## 2016-07-27 DIAGNOSIS — E785 Hyperlipidemia, unspecified: Secondary | ICD-10-CM | POA: Diagnosis not present

## 2016-09-12 ENCOUNTER — Other Ambulatory Visit: Payer: Self-pay | Admitting: Family

## 2017-01-18 DIAGNOSIS — Z1159 Encounter for screening for other viral diseases: Secondary | ICD-10-CM | POA: Diagnosis not present

## 2017-01-18 DIAGNOSIS — Z0000 Encounter for general adult medical examination without abnormal findings: Secondary | ICD-10-CM | POA: Diagnosis not present

## 2017-01-18 DIAGNOSIS — E7800 Pure hypercholesterolemia, unspecified: Secondary | ICD-10-CM | POA: Diagnosis not present

## 2017-01-18 DIAGNOSIS — K219 Gastro-esophageal reflux disease without esophagitis: Secondary | ICD-10-CM | POA: Diagnosis not present

## 2017-01-18 DIAGNOSIS — E119 Type 2 diabetes mellitus without complications: Secondary | ICD-10-CM | POA: Diagnosis not present

## 2017-01-18 DIAGNOSIS — I10 Essential (primary) hypertension: Secondary | ICD-10-CM | POA: Diagnosis not present

## 2017-01-18 DIAGNOSIS — G4733 Obstructive sleep apnea (adult) (pediatric): Secondary | ICD-10-CM | POA: Diagnosis not present

## 2017-01-18 DIAGNOSIS — Z23 Encounter for immunization: Secondary | ICD-10-CM | POA: Diagnosis not present

## 2017-01-18 DIAGNOSIS — Z87891 Personal history of nicotine dependence: Secondary | ICD-10-CM | POA: Diagnosis not present

## 2017-01-18 LAB — LIPID PANEL
Cholesterol: 253 — AB (ref 0–200)
HDL: 44 (ref 35–70)
LDL Cholesterol: 158
Triglycerides: 250 — AB (ref 40–160)

## 2017-01-18 LAB — BASIC METABOLIC PANEL
BUN: 16 (ref 4–21)
Creatinine: 1 (ref 0.6–1.3)
Glucose: 140
Potassium: 4.6 (ref 3.4–5.3)
Sodium: 139 (ref 137–147)

## 2017-01-18 LAB — HEMOGLOBIN A1C: Hemoglobin A1C: 6.2

## 2017-01-18 LAB — HM HEPATITIS C SCREENING LAB: HM Hepatitis Screen: NEGATIVE

## 2017-01-18 LAB — MICROALBUMIN, URINE: Microalb, Ur: 2.27

## 2017-01-19 ENCOUNTER — Other Ambulatory Visit (HOSPITAL_BASED_OUTPATIENT_CLINIC_OR_DEPARTMENT_OTHER): Payer: Self-pay | Admitting: Family Medicine

## 2017-01-19 DIAGNOSIS — Z87891 Personal history of nicotine dependence: Secondary | ICD-10-CM

## 2017-08-16 ENCOUNTER — Ambulatory Visit (INDEPENDENT_AMBULATORY_CARE_PROVIDER_SITE_OTHER): Payer: PPO | Admitting: Family Medicine

## 2017-08-16 ENCOUNTER — Encounter: Payer: Self-pay | Admitting: Family Medicine

## 2017-08-16 VITALS — BP 143/80 | HR 63 | Temp 98.6°F | Resp 16 | Ht 68.0 in | Wt 232.5 lb

## 2017-08-16 DIAGNOSIS — E7800 Pure hypercholesterolemia, unspecified: Secondary | ICD-10-CM

## 2017-08-16 DIAGNOSIS — Z125 Encounter for screening for malignant neoplasm of prostate: Secondary | ICD-10-CM

## 2017-08-16 DIAGNOSIS — Z23 Encounter for immunization: Secondary | ICD-10-CM

## 2017-08-16 DIAGNOSIS — Z0000 Encounter for general adult medical examination without abnormal findings: Secondary | ICD-10-CM | POA: Diagnosis not present

## 2017-08-16 DIAGNOSIS — R7303 Prediabetes: Secondary | ICD-10-CM

## 2017-08-16 LAB — COMPREHENSIVE METABOLIC PANEL
ALT: 27 U/L (ref 0–53)
AST: 20 U/L (ref 0–37)
Albumin: 4.4 g/dL (ref 3.5–5.2)
Alkaline Phosphatase: 89 U/L (ref 39–117)
BUN: 18 mg/dL (ref 6–23)
CO2: 28 mEq/L (ref 19–32)
Calcium: 9.5 mg/dL (ref 8.4–10.5)
Chloride: 104 mEq/L (ref 96–112)
Creatinine, Ser: 1.01 mg/dL (ref 0.40–1.50)
GFR: 77.4 mL/min (ref >60.00)
Glucose, Bld: 138 mg/dL — ABNORMAL HIGH (ref 70–99)
Potassium: 4.6 mEq/L (ref 3.5–5.1)
Sodium: 141 mEq/L (ref 135–145)
Total Bilirubin: 1.1 mg/dL (ref 0.2–1.2)
Total Protein: 6.9 g/dL (ref 6.0–8.3)

## 2017-08-16 LAB — CBC WITH DIFFERENTIAL/PLATELET
Basophils Absolute: 0.1 10*3/uL (ref 0.0–0.1)
Basophils Relative: 1.2 % (ref 0.0–3.0)
Eosinophils Absolute: 0 10*3/uL (ref 0.0–0.7)
Eosinophils Relative: 1.1 % (ref 0.0–5.0)
HCT: 50.5 % (ref 39.0–52.0)
Hemoglobin: 17.3 g/dL — ABNORMAL HIGH (ref 13.0–17.0)
Lymphocytes Relative: 29.1 % (ref 12.0–46.0)
Lymphs Abs: 1.4 10*3/uL (ref 0.7–4.0)
MCHC: 34.3 g/dL (ref 30.0–36.0)
MCV: 89.3 fl (ref 78.0–100.0)
Monocytes Absolute: 0.3 10*3/uL (ref 0.1–1.0)
Monocytes Relative: 6.8 % (ref 3.0–12.0)
Neutro Abs: 2.9 10*3/uL (ref 1.4–7.7)
Neutrophils Relative %: 61.8 % (ref 43.0–77.0)
Platelets: 181 10*3/uL (ref 150.0–400.0)
RBC: 5.66 Mil/uL (ref 4.22–5.81)
RDW: 13.7 % (ref 11.5–15.5)
WBC: 4.7 10*3/uL (ref 4.0–10.5)

## 2017-08-16 LAB — LIPID PANEL
Cholesterol: 273 mg/dL — ABNORMAL HIGH (ref 0–200)
HDL: 49.6 mg/dL (ref >39.00)
NonHDL: 223.8
Total CHOL/HDL Ratio: 6
Triglycerides: 252 mg/dL — ABNORMAL HIGH (ref 0.0–149.0)
VLDL: 50.4 mg/dL — ABNORMAL HIGH (ref 0.0–40.0)

## 2017-08-16 LAB — LDL CHOLESTEROL, DIRECT: Direct LDL: 159 mg/dL

## 2017-08-16 LAB — HEMOGLOBIN A1C: Hgb A1c MFr Bld: 6 % (ref 4.6–6.5)

## 2017-08-16 LAB — PSA: PSA: 5.51 ng/mL — ABNORMAL HIGH (ref 0.10–4.00)

## 2017-08-16 NOTE — Progress Notes (Signed)
Office Note 08/16/2017  CC:  Chief Complaint  Patient presents with  . Establish Care    [facility] PCP: Dr. Lafonda Mosses  . Annual Exam    Pt is fasting.     HPI:  Wayne Lamb is a 71 y.o. White male who is here to establish care. Patient's most recent primary MD: Dr. Olen Pel at [facility] in [place].  Dr. Maceo Pro prior to that. Old records in EPIC/[facility] EMR were reviewed prior to or during today's visit.  He believes a lot in essential oils, is not a prescription drug fan. Dx of DM, pt doesn't agree, he went to a nutritionist and found it disappointing. He says he feels great.  He recalls his most recent CPE with labs was 1.5 yrs ago.  Had medicare wellness 12/2016.  Exercise: 2-3 days a week CV and strength straining.  He is an avid Air cabin crew. Diet: he makes no effort to eat a healthy diet.  Colon ca screening: x 2 colonoscopy with [facility]--both normal.  He wants PSA screening but declines DRE b/c he doesn't feel like it is necessary.   Past Medical History:  Diagnosis Date  . DM (diabetes mellitus), type 2 ([place])   . Elevated PSA    MRI prostate was done 2016 to eval persistently rising PSAs (no biopsy had been done): this showed no sign of prostate ca.  . Essential hypertension    pt has declined to take med in past (as of 12/2016)  . Hypercholesterolemia   . Lumbar spondylosis    MRI L spine 2005: severe facet deg, SI deg  . Obesity, Class II, BMI 35-39.9   . OSA on CPAP   . Rhinitis     Past Surgical History:  Procedure Laterality Date  . INGUINAL HERNIA REPAIR Left   . right clavicle fracture     rod inserted  . TONSILLECTOMY      Family History  Problem Relation Age of Onset  . Alcohol abuse Father   . Early death Father 34       unknown cause of death  . Heart disease Mother   . Heart disease Sister   . Learning disabilities Brother        Dyslexia    Social History   Social History  . Marital status: Married    Spouse name: N/A  . Number of  children: N/A  . Years of education: N/A   Occupational History  . Not on file.   Social History Main Topics  . Smoking status: Former Smoker    Packs/day: 1.00    Years: 15.00    Types: Cigarettes    Quit date: 11/29/1979  . Smokeless tobacco: Never Used  . Alcohol use Yes     Comment: beer or a glass of wine occasionally  . Drug use: No  . Sexual activity: Not on file   Other Topics Concern  . Not on file   Social History Narrative   Married, 1 son who is an Forensic psychologist in [place].   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   Tob: former smoker, 15 pack-yr hx, quit 1970.   Alc: no   Exercise: 2-3 days per week, CV and wt training.       Outpatient Encounter Prescriptions as of 08/16/2017  Medication Sig  . Multiple Vitamins-Minerals (MULTIVITAMIN WITH MINERALS) tablet Take 1 tablet by mouth daily.   No facility-administered encounter medications on file as of 08/16/2017.  No Known Allergies  ROS Review of Systems  Constitutional: Negative for appetite change, chills, fatigue and fever.  HENT: Negative for congestion, dental problem, ear pain and sore throat.   Eyes: Negative for discharge, redness and visual disturbance.  Respiratory: Negative for cough, chest tightness, shortness of breath and wheezing.   Cardiovascular: Negative for chest pain, palpitations and leg swelling.  Gastrointestinal: Negative for abdominal pain, blood in stool, diarrhea, nausea and vomiting.  Genitourinary: Negative for difficulty urinating, dysuria, flank pain, frequency, hematuria and urgency.  Musculoskeletal: Negative for arthralgias, back pain, joint swelling, myalgias and neck stiffness.  Skin: Negative for pallor and rash.  Neurological: Negative for dizziness, speech difficulty, weakness and headaches.  Hematological: Negative for adenopathy. Does not bruise/bleed easily.  Psychiatric/Behavioral: Negative for confusion and sleep disturbance. The patient is not  nervous/anxious.     PE; Pulse 63, temperature 98.6 F (37 C), temperature source Oral, resp. rate 16, height 5\' 8"  (1.727 m), weight 232 lb 8 oz (105.5 kg), SpO2 94 %. Body mass index is 35.35 kg/m.  Gen: Alert, well appearing.  Patient is oriented to person, place, time, and situation. AFFECT: pleasant, lucid thought and speech. ENT: Ears: EACs clear, normal epithelium.  TMs with good light reflex and landmarks bilaterally.  Eyes: no injection, icteris, swelling, or exudate.  EOMI, PERRLA. Nose: no drainage or turbinate edema/swelling.  No injection or focal lesion.  Mouth: lips without lesion/swelling.  Oral mucosa pink and moist.  Dentition intact and without obvious caries or gingival swelling.  Oropharynx without erythema, exudate, or swelling.  Neck: supple/nontender.  No LAD, mass, or TM.  Carotid pulses 2+ bilaterally, without bruits. CV: RRR, no m/r/g.   LUNGS: CTA bilat, nonlabored resps, good aeration in all lung fields. ABD: soft, NT, ND, BS normal.  No hepatospenomegaly or mass.  No bruits. EXT: no clubbing, cyanosis, or edema.  Musculoskeletal: no joint swelling, erythema, warmth, or tenderness.  ROM of all joints intact. Skin - no sores or suspicious lesions or rashes or color changes Rectal: pt declined.  Pertinent labs:  No results found for: TSH No results found for: WBC, HGB, HCT, MCV, PLT Lab Results  Component Value Date   CREATININE 1.00 07/27/2015   No results found for: ALT, AST, GGT, ALKPHOS, BILITOT No results found for: CHOL No results found for: HDL No results found for: LDLCALC No results found for: TRIG No results found for: CHOLHDL No results found for: PSA  ASSESSMENT AND PLAN:   New pt: obtain prior PCP records.  1) DM 2 vs prediabetes: need prior records to clarify. I see one fasting gluc > 126 in the labs we have available from prior PCP, A1c 6.2% back in Feb 2018. Will review old records to clarify.  Check HbA1c today.  Pt states annual  eye exams are "good, stable". At any rate, pt is not going to take meds for this condition---he feels like meds do more harm than good.  2) HTN: Pt has declined me tx in the past and continues to do so today.  3) Hyperlipidemia: pt declines meds, same reason as #1 above.  4) Health maintenance exam: Reviewed age and gender appropriate health maintenance issues (prudent diet, regular exercise, health risks of tobacco and excessive alcohol, use of seatbelts, fire alarms in home, use of sunscreen).  Also reviewed age and gender appropriate health screening as well as vaccine recommendations. Vaccines: flu vaccine given today. Labs: CBC w/diff, CMET, FLP, PSA, and HbA1c today. Colon ca screening:  has had 2 normal colonoscopies in the past with [facility], but no record in EMR so we'll request these. Prostate ca screening: hx of elevated PSAs, MRI prostate was normal, he did not elect to get a biopsy.  He no longer sees a urologist. He declined rectal exam today b/c he does not believe it is necessary, but he was in favor of getting PSA done today.  An After Visit Summary was printed and given to the patient.  Return in about 6 months (around 02/13/2018) for routine chronic illness f/u.  Signed:  Crissie Sickles, MD           08/16/2017

## 2017-08-16 NOTE — Patient Instructions (Signed)

## 2017-08-16 NOTE — Addendum Note (Signed)
Addended by: Onalee Hua on: 08/16/2017 11:50 AM   Modules accepted: Orders

## 2017-10-05 DIAGNOSIS — G4733 Obstructive sleep apnea (adult) (pediatric): Secondary | ICD-10-CM | POA: Diagnosis not present

## 2017-10-06 ENCOUNTER — Other Ambulatory Visit: Payer: Self-pay

## 2017-10-06 ENCOUNTER — Telehealth: Payer: Self-pay | Admitting: Family Medicine

## 2017-10-06 ENCOUNTER — Emergency Department (HOSPITAL_BASED_OUTPATIENT_CLINIC_OR_DEPARTMENT_OTHER)
Admission: EM | Admit: 2017-10-06 | Discharge: 2017-10-06 | Disposition: A | Discharge status: Home / Self Care | Payer: PPO | Attending: Emergency Medicine | Admitting: Emergency Medicine

## 2017-10-06 ENCOUNTER — Encounter (HOSPITAL_BASED_OUTPATIENT_CLINIC_OR_DEPARTMENT_OTHER): Payer: Self-pay

## 2017-10-06 DIAGNOSIS — Z79899 Other long term (current) drug therapy: Secondary | ICD-10-CM | POA: Insufficient documentation

## 2017-10-06 DIAGNOSIS — Z87891 Personal history of nicotine dependence: Secondary | ICD-10-CM | POA: Diagnosis not present

## 2017-10-06 DIAGNOSIS — I10 Essential (primary) hypertension: Secondary | ICD-10-CM | POA: Diagnosis not present

## 2017-10-06 DIAGNOSIS — E119 Type 2 diabetes mellitus without complications: Secondary | ICD-10-CM | POA: Diagnosis not present

## 2017-10-06 DIAGNOSIS — R42 Dizziness and giddiness: Secondary | ICD-10-CM | POA: Insufficient documentation

## 2017-10-06 MED ORDER — DIAZEPAM 5 MG PO TABS: 2.5000 mg | ORAL_TABLET | Freq: Four times a day (QID) | ORAL | 0 refills | Status: DC | PRN

## 2017-10-06 MED ORDER — MECLIZINE HCL 25 MG PO TABS: 25.0000 mg | ORAL_TABLET | Freq: Three times a day (TID) | ORAL | 0 refills | Status: DC | PRN

## 2017-10-06 MED ORDER — MECLIZINE HCL 25 MG PO TABS
25.0000 mg | ORAL_TABLET | Freq: Once | ORAL | Status: AC
  Administered 2017-10-06: 25 mg via ORAL
  Filled 2017-10-06: qty 1

## 2017-10-06 MED FILL — diazePAM 5 MG TABS: 5 | 8 days supply | Qty: 15 | Fill #0

## 2017-10-06 MED FILL — MECLIZINE 25 MG TABLET: 25 | 10 days supply | Qty: 30 | Fill #0

## 2017-10-06 NOTE — ED Notes (Signed)
ED Provider at bedside. 

## 2017-10-06 NOTE — ED Provider Notes (Signed)
[facility] Provider Note   CSN: [id number] Arrival date & time: 10/06/17  1152     History   Chief Complaint Chief Complaint  Patient presents with  . Dizziness    HPI Wayne Lamb is a 71 y.o. male.  HPI Reports he is beginning spinning episodes of dizziness for about a week.  They have been coming and going fairly quickly.  The first time it started he Lamb out of bed and had a merry-go-round sensation that improved after he was up for a few minutes.  Since then particularly with a position change such as turning the head he will get a spinning quality of dizziness.  It changed today because it started and was more persistent and frequent.  Reports now with certain movements is coming on staying longer.  No associated headache.  No associated visual changes no double vision no loss of vision.  No associated weakness numbness tingling of extremities.  Reports he has been well without any problems of chest pain, dyspnea, abdominal pain, nausea, vomiting, diarrhea.  He reports a couple weeks ago there was a brief period where he thought he was coming down with either a cold or his allergies were acting up but that then resolved. Past Medical History:  Diagnosis Date  . DM (diabetes mellitus), type 2 ([place])   . Elevated PSA    MRI prostate was done 2016 to eval persistently rising PSAs (no biopsy had been done): this showed no sign of prostate ca.  . Essential hypertension    pt has declined to take med in past (as of 12/2016)  . Hypercholesterolemia   . Lumbar spondylosis    MRI L spine 2005: severe facet deg, SI deg  . Obesity, Class II, BMI 35-39.9   . OSA on CPAP   . Rhinitis     Patient Active Problem List   Diagnosis Date Noted  . RHINITIS 11/08/2009  . Obstructive sleep apnea 11/04/2009    Past Surgical History:  Procedure Laterality Date  . INGUINAL HERNIA REPAIR Left   . right clavicle fracture     rod inserted  . TONSILLECTOMY          Home Medications    Prior to Admission medications   Medication Sig Start Date End Date Taking? Authorizing Provider  diazepam (VALIUM) 5 MG tablet Take 0.5 tablets (2.5 mg total) every 6 (six) hours as needed by mouth (VERTIGO). 10/06/17   Charlesetta Shanks, MD  meclizine (ANTIVERT) 25 MG tablet Take 1 tablet (25 mg total) 3 (three) times daily as needed by mouth for dizziness. 10/06/17   Charlesetta Shanks, MD  Multiple Vitamins-Minerals (MULTIVITAMIN WITH MINERALS) tablet Take 1 tablet by mouth daily.    [provider]    Family History Family History  Problem Relation Age of Onset  . Alcohol abuse Father   . Early death Father 7       unknown cause of death  . Heart disease Mother   . Heart disease Sister   . Learning disabilities Brother        Dyslexia    Social History Social History   Tobacco Use  . Smoking status: Former Smoker    Packs/day: 1.00    Years: 15.00    Pack years: 15.00    Types: Cigarettes    Last attempt to quit: 11/29/1979    Years since quitting: 37.8  . Smokeless tobacco: Never Used  Substance Use Topics  . Alcohol use:  Yes    Comment: occ  . Drug use: No     Allergies   Patient has no known allergies.   Review of Systems Review of Systems 10 Systems reviewed and are negative for acute change except as noted in the HPI.   Physical Exam Updated Vital Signs BP (!) 146/74   Pulse 60   Temp 98.1 F (36.7 C) (Oral)   Resp 16   Ht 5\' 8"  (1.727 m)   Wt 107.2 kg (236 lb 5.3 oz)   SpO2 95%   BMI 35.93 kg/m   Physical Exam  Constitutional: He is oriented to person, place, and time. He appears well-developed and well-nourished. No distress.  Patient is clinically well in appearance alert and appropriate.  HENT:  Head: Normocephalic and atraumatic.  Nose: Nose normal.  Bilateral TMs normal.  Oropharynx clear and moist.  Posterior oropharynx widely patent.  Eyes: EOM are normal. Pupils are equal, round, and reactive to  light.  Patient has lateral nystagmus to the right.  Pulses are symmetric and responsive.  Ocular motions are intact.  Neck: Neck supple.  Cardiovascular: Normal rate, regular rhythm, normal heart sounds and intact distal pulses.  Pulmonary/Chest: Effort normal and breath sounds normal.  Abdominal: Soft. Bowel sounds are normal. He exhibits no distension. There is no tenderness.  Musculoskeletal: Normal range of motion. He exhibits no edema.  Neurological: He is alert and oriented to person, place, and time. He has normal strength. No cranial nerve deficit or sensory deficit. He exhibits normal muscle tone. Coordination normal. GCS eye subscore is 4. GCS verbal subscore is 5. GCS motor subscore is 6.  Normal finger-nose exam bilaterally.  Normal heel-to-shin exam bilaterally.  Motor strength 5\5 upper and lower extremities.  Speech and cognitive function normal.  Skin: Skin is warm, dry and intact.  Psychiatric: He has a normal mood and affect.     ED Treatments / Results  Labs (all labs ordered are listed, but only abnormal results are displayed) Labs Reviewed - No data to display  EKG  EKG Interpretation None       Radiology No results found.  Procedures Procedures (including critical care time)  Medications Ordered in ED Medications  meclizine (ANTIVERT) tablet 25 mg (25 mg Oral Given 10/06/17 1301)     Initial Impression / Assessment and Plan / ED Course  I have reviewed the triage vital signs and the nursing notes.  Pertinent labs & imaging results that were available during my care of the patient were reviewed by me and considered in my medical decision making (see chart for details).     Recheck 14: 50 patient feels much improved with meclizine.  He reports if he moves his head very briskly he still gets somewhat symptomatic but he is much better than he was.  Final Clinical Impressions(s) / ED Diagnoses   Final diagnoses:  Vertigo  Patient presents with  symptoms consistent with benign peripheral vertigo.  Symptoms occur with position change of the head and he has no other associated neurologic dysfunction.  Neurologic exam is otherwise normal.  No headache.  No significant recent illness.  Patient experienced significant symptomatic improvement from meclizine.  Return precautions reviewed.  Patient is instructed on follow-up plan with his PCP.  ED Discharge Orders        Ordered    meclizine (ANTIVERT) 25 MG tablet  3 times daily PRN     10/06/17 1450    diazepam (VALIUM) 5 MG tablet  Every  6 hours PRN     10/06/17 1450       Charlesetta Shanks, MD 10/06/17 1452

## 2017-10-06 NOTE — Telephone Encounter (Signed)
Noted  

## 2017-10-06 NOTE — ED Triage Notes (Signed)
Pt c/o dizzy episodes 1/2/day x 1 week-NAD-steady gait-feeling dizzy with nausea today-PCP advised pt via phone to come to ED

## 2017-10-06 NOTE — Telephone Encounter (Signed)
Pt is at [facility] now.

## 2017-10-06 NOTE — Telephone Encounter (Signed)
Patient Name: Wayne Lamb DOB: 29-Aug-1946 Initial Comment Caller is having dizziness and vertigo. Has been like this for a week. Nurse Assessment Nurse: Cherie Dark RN, Jarrett Soho Date/Time (Eastern Time): 10/06/2017 11:11:50 AM Confirm and document reason for call. If symptomatic, describe symptoms. ---Caller states he has been having some dizziness and vertigo on and off for the past week. This morning it has been bad and he can't shake it. Is feeling nauseated. Does the patient have any new or worsening symptoms? ---Yes Will a triage be completed? ---Yes Related visit to physician within the last 2 weeks? ---No Does the PT have any chronic conditions? (i.e. diabetes, asthma, etc.) ---Yes List chronic conditions. ---diabetes, HTN, Is this a behavioral health or substance abuse call? ---No Guidelines Guideline Title Affirmed Question Affirmed Notes Dizziness - Vertigo [1] Dizziness (vertigo) present now AND [2] one or more stroke risk factors (i.e., hypertension, diabetes, prior stroke/TIA/ heart attack) (Exception: prior physician evaluation for this AND no different/worse than usual) Final Disposition User Go to ED Now (or PCP triage) Cherie Dark, RN, Jarrett Soho Comments Patient stated he was going to see about an UC first. Informed caller that if he cannot go to an UC then to go to the ER. Patient aware. Referrals GO TO FACILITY UNDECIDED Caller Disagree/Comply Comply Caller Understands Yes PreDisposition Call Doctor

## 2017-10-09 ENCOUNTER — Ambulatory Visit: Payer: PPO | Admitting: Family Medicine

## 2017-10-09 ENCOUNTER — Other Ambulatory Visit: Payer: Self-pay

## 2017-10-09 ENCOUNTER — Encounter: Payer: Self-pay | Admitting: Family Medicine

## 2017-10-09 VITALS — BP 168/94 | HR 68 | Temp 98.1°F | Resp 16 | Ht 68.0 in | Wt 233.2 lb

## 2017-10-09 DIAGNOSIS — H8113 Benign paroxysmal vertigo, bilateral: Secondary | ICD-10-CM

## 2017-10-09 NOTE — Progress Notes (Signed)
OFFICE VISIT  10/09/2017   CC:  Chief Complaint  Patient presents with  . Follow-up    ER visit   HPI:    Patient is a 71 y.o. Caucasian male who presents for f/u emergency dept visit 10/06/17 for vertigo. Was dx'd with BPPV and ED MD stated pt got good improvement in sx's with use of meclizine. He described classic sx's of vertiginous sensation triggered by turn of head, occurring repeatedly. Resolving in < 1 min, assoc with nausea but no vomiting.  Since going home from ED, only a couple of short episodes of vertigo.  Has felt well otherwise until today --he now has some mild disequilibrium but no nausea or vertigo.  TAking meclizine tid, but no valium.  No fever.  No vomiting or diarrhea. No HA.  No double vision.  The sx's always get worse when he turns head or changes body positions.  ED chart reviewed: MD note.  No imaging or labs were done.  Pt denies focal weakness, paresthesias, palpitations, heart racing, CP, or jaw or arm pain. He denies any ataxia.  No recent head trauma.  Past Medical History:  Diagnosis Date  . DM (diabetes mellitus), type 2 ([place])   . Elevated PSA    MRI prostate was done 2016 to eval persistently rising PSAs (no biopsy had been done): this showed no sign of prostate ca.  . Essential hypertension    pt has declined to take med in past (as of 12/2016)  . Hypercholesterolemia   . Lumbar spondylosis    MRI L spine 2005: severe facet deg, SI deg  . Obesity, Class II, BMI 35-39.9   . OSA on CPAP   . Rhinitis     Past Surgical History:  Procedure Laterality Date  . INGUINAL HERNIA REPAIR Left   . right clavicle fracture     rod inserted  . TONSILLECTOMY      Outpatient Medications Prior to Visit  Medication Sig Dispense Refill  . diazepam (VALIUM) 5 MG tablet Take 0.5 tablets (2.5 mg total) every 6 (six) hours as needed by mouth (VERTIGO). 15 tablet 0  . meclizine (ANTIVERT) 25 MG tablet Take 1 tablet (25 mg total) 3 (three) times daily as  needed by mouth for dizziness. 30 tablet 0  . Multiple Vitamins-Minerals (MULTIVITAMIN WITH MINERALS) tablet Take 1 tablet by mouth daily.     No facility-administered medications prior to visit.     No Known Allergies  ROS As per HPI  PE: Blood pressure (!) 168/94, pulse 68, temperature 98.1 F (36.7 C), temperature source Oral, resp. rate 16, height 5\' 8"  (1.727 m), weight 233 lb 4 oz (105.8 kg), SpO2 93 %. Gen: Alert, well appearing.  Patient is oriented to person, place, time, and situation. AFFECT: pleasant, lucid thought and speech. KZS:WFUX: no injection, icteris, swelling, or exudate.  EOMI, PERRLA.  There is very subtle amount of lateral nystagmus with gaze to the left.   Mouth: lips without lesion/swelling.  Oral mucosa pink and moist. Oropharynx without erythema, exudate, or swelling.  CV: RRR, no m/r/g.   LUNGS: CTA bilat, nonlabored resps, good aeration in all lung fields. Neuro: CN 2-12 intact bilaterally, strength 5/5 in proximal and distal upper extremities and lower extremities bilaterally.  No sensory deficits.  No tremor.   Dix-Halpike: head turn to R: laying back elicited no sx's and no nystagmus, but upon sitting up he got vertigo and lateral nystagmus with fast beat to the right (both eyes).  This  extinguished in about 30 seconds. Head turn to L, lying supine induced no sx's or nystagmus, but sitting up induced vertigo with a milder lateral nystagmus with fast beat to the R in both eyes.  Took only 15 sec or so to extinguish.    LABS:  none  IMPRESSION AND PLAN:  BPPV; improving but I was able to induce it with Dix-Halpike here today. Discussed home Epley maneuvers, which he is familiar with b/c his wife has had similar problems in the past. He should do these tid until he has been w/out any vertigo episode for 24h. Continue meclizine tid prn.  An After Visit Summary was printed and given to the patient.  FOLLOW UP: Return if symptoms worsen or fail to  improve in 1 week.  Signed:  Crissie Sickles, MD           10/09/2017

## 2017-10-12 ENCOUNTER — Encounter: Payer: Self-pay | Admitting: *Deleted

## 2017-10-12 ENCOUNTER — Telehealth: Payer: Self-pay | Admitting: *Deleted

## 2017-10-12 ENCOUNTER — Encounter: Payer: Self-pay | Admitting: Family Medicine

## 2017-10-12 NOTE — Telephone Encounter (Signed)
Received medical records from [facility].  I have reviewed records and abstracted information into pts chart.   Records have been placed on Dr. Idelle Leech desk for review.

## 2017-10-24 ENCOUNTER — Telehealth: Payer: Self-pay | Admitting: Family Medicine

## 2017-10-24 DIAGNOSIS — Z1211 Encounter for screening for malignant neoplasm of colon: Secondary | ICD-10-CM

## 2017-10-24 NOTE — Telephone Encounter (Signed)
Per MyChart message: Appointment Request From: Wayne Lamb    With Provider: [name], MD [facility]    Preferred Date Range: From 10/23/2017 To 10/24/2017    Preferred Times: Any    Reason: To address the following health maintenance concerns.  Foot Exam  Ophthalmology Exam  Colonoscopy  Pna Vac Low Risk Adult   Left detailed message for patient that foot exam & Pna Vaccine will be completed at 02/14/18 visit. Patient may make his ophthalmology appointment with his current opthamologist. No referral required.  We will enter a referral to [facility] for the patient's screening colonoscopy.

## 2017-10-30 NOTE — Telephone Encounter (Signed)
Okay to order referral to [facility] for colonoscopy. We are working on getting previous records.

## 2017-10-30 NOTE — Telephone Encounter (Signed)
Order done

## 2017-10-30 NOTE — Telephone Encounter (Signed)
Yes, ok to order [facility] referral.-thx

## 2017-10-31 ENCOUNTER — Encounter: Payer: Self-pay | Admitting: Family Medicine

## 2017-12-14 ENCOUNTER — Encounter: Payer: Self-pay | Admitting: Family Medicine

## 2018-02-14 ENCOUNTER — Encounter: Payer: Self-pay | Admitting: Family Medicine

## 2018-02-14 ENCOUNTER — Ambulatory Visit (INDEPENDENT_AMBULATORY_CARE_PROVIDER_SITE_OTHER): Payer: PPO | Admitting: Family Medicine

## 2018-02-14 VITALS — BP 143/74 | HR 56 | Temp 97.5°F | Ht 68.0 in | Wt 223.4 lb

## 2018-02-14 DIAGNOSIS — R972 Elevated prostate specific antigen [PSA]: Secondary | ICD-10-CM

## 2018-02-14 DIAGNOSIS — E7800 Pure hypercholesterolemia, unspecified: Secondary | ICD-10-CM | POA: Diagnosis not present

## 2018-02-14 DIAGNOSIS — I10 Essential (primary) hypertension: Secondary | ICD-10-CM | POA: Diagnosis not present

## 2018-02-14 DIAGNOSIS — E119 Type 2 diabetes mellitus without complications: Secondary | ICD-10-CM

## 2018-02-14 LAB — HEMOGLOBIN A1C: Hgb A1c MFr Bld: 5.7 % (ref 4.6–6.5)

## 2018-02-14 LAB — PSA: PSA: 5.67 ng/mL — AB (ref 0.10–4.00)

## 2018-02-14 NOTE — Progress Notes (Signed)
OFFICE VISIT  02/19/2018   CC:  Chief Complaint  Patient presents with  . Follow-up    RCI   HPI:    Patient is a 72 y.o. Caucasian male who presents for 6 mo f/u DM 2, HTN, HLD. Has changed diet, increased exercise (Kettle bells exercises 2 times a week, plays golf once a week), has lost 10 lbs, feeling very good. Minimizing Na and carbs (keto diet for the last 3 mo).  HTN: occ home bp check 130s/80s.  DM: he does not monitor any glucoses at home.  HLD: dietary changes as above.  Hx of elevated PSA (see PMH below for details): due for repeat PSA today.  ROS: no dizziness, HA's, palpitations, LE swelling, CP, SOB, polyuria, or polydipsia.  Past Medical History:  Diagnosis Date  . DM (diabetes mellitus), type 2 ([place]) 03/2016   HbA1c 6.2% on 01/18/17.  Initially dx'd by fasting glucose criteria.  . Elevated PSA    MRI prostate was done 2016 to eval persistently rising PSAs (no biopsy had been done): this showed no sign of prostate ca.  . Encounter for hepatitis C virus screening test for high risk patient    Done by [facility] physicians 01/18/17  . Essential hypertension    pt has declined to take med in past (as of 12/2016)  . GERD (gastroesophageal reflux disease)   . Hypercholesterolemia   . Lumbar spondylosis    MRI L spine 2005: severe facet deg, SI deg  . Obesity, Class II, BMI 35-39.9   . OSA on CPAP   . Prostate nodule   . Rhinitis   . Tubular adenoma of colon 2014   recommend repeat colonoscopy 2019    Past Surgical History:  Procedure Laterality Date  . INGUINAL HERNIA REPAIR Left   . right clavicle fracture     pin inserted (Dr. Eddie Dibbles)  . TONSILLECTOMY      Outpatient Medications Prior to Visit  Medication Sig Dispense Refill  . Multiple Vitamins-Minerals (MULTIVITAMIN WITH MINERALS) tablet Take 1 tablet by mouth daily.    . diazepam (VALIUM) 5 MG tablet Take 0.5 tablets (2.5 mg total) every 6 (six) hours as needed by mouth (VERTIGO). (Patient not taking:  Reported on 02/14/2018) 15 tablet 0  . meclizine (ANTIVERT) 25 MG tablet Take 1 tablet (25 mg total) 3 (three) times daily as needed by mouth for dizziness. 30 tablet 0   No facility-administered medications prior to visit.     No Known Allergies  ROS As per HPI  PE: Blood pressure (!) 143/74, pulse (!) 56, temperature (!) 97.5 F (36.4 C), temperature source Oral, height 5\' 8"  (1.727 m), weight 223 lb 6.4 oz (101.3 kg), SpO2 97 %. Gen: Alert, well appearing.  Patient is oriented to person, place, time, and situation. AFFECT: pleasant, lucid thought and speech. CV: RRR, no m/r/g.   LUNGS: CTA bilat, nonlabored resps, good aeration in all lung fields. EXT: no clubbing or cyanosis.  1+ pitting edema present bilat.  No rash.  LABS:  No results found for: TSH Lab Results  Component Value Date   WBC 4.7 08/16/2017   HGB 17.3 (H) 08/16/2017   HCT 50.5 08/16/2017   MCV 89.3 08/16/2017   PLT 181.0 08/16/2017   Lab Results  Component Value Date   CREATININE 0.97 02/14/2018   BUN 11 02/14/2018   NA 141 02/14/2018   K 4.7 02/14/2018   CL 104 02/14/2018   CO2 26 02/14/2018   Lab Results  Component  Value Date   ALT 27 08/16/2017   AST 20 08/16/2017   ALKPHOS 89 08/16/2017   BILITOT 1.1 08/16/2017   Lab Results  Component Value Date   CHOL 243 (H) 02/14/2018   Lab Results  Component Value Date   HDL 46.80 02/14/2018   Lab Results  Component Value Date   LDLCALC 159 (H) 02/14/2018   Lab Results  Component Value Date   TRIG 186.0 (H) 02/14/2018   Lab Results  Component Value Date   CHOLHDL 5 02/14/2018   Lab Results  Component Value Date   PSA 5.67 (H) 02/14/2018   PSA 5.51 (H) 08/16/2017   PSA 4.83 02/16/2015   Lab Results  Component Value Date   HGBA1C 5.7 02/14/2018    IMPRESSION AND PLAN:  1) DM 2: diet controlled. Pt not open to med treatment unless "things get real bad". HbA1c today. Urine microalb/cr today. Fee exam at next f/u visit.  2)  HTN: bp's normal at home, although monitoring is not all that frequent. Encouraged him to continue monitoring. Discussed the importance of good bp control for decreasing risk of CV dz and stroke.   Pt expressed understanding, but he reiterated that he has no confidence that pharmacotherapy will be helpful for him at all. Lytes/cr today.  3) HLD: diet and exercise fairly good/improved. FLP today. Pt not open to med mgmt of this.  4) Hx of elevated PSA: recheck today to see trend, and request his urologist's most recent note to get an idea of trend of PSAs and potential need for f/u with this specialist.  An After Visit Summary was printed and given to the patient.  FOLLOW UP: Return in about 6 months (around 08/17/2018) for annual CPE (fasting).  Signed:  Crissie Sickles, MD           02/19/2018

## 2018-02-15 LAB — MICROALBUMIN / CREATININE URINE RATIO
Creatinine,U: 146.7 mg/dL
MICROALB/CREAT RATIO: 1.3 mg/g (ref 0.0–30.0)
Microalb, Ur: 1.9 mg/dL (ref 0.0–1.9)

## 2018-02-15 LAB — BASIC METABOLIC PANEL
BUN: 11 mg/dL (ref 6–23)
CALCIUM: 9.6 mg/dL (ref 8.4–10.5)
CO2: 26 mEq/L (ref 19–32)
CREATININE: 0.97 mg/dL (ref 0.40–1.50)
Chloride: 104 mEq/L (ref 96–112)
GFR: 80.98 mL/min (ref >60.00)
Glucose, Bld: 136 mg/dL — ABNORMAL HIGH (ref 70–99)
Potassium: 4.7 mEq/L (ref 3.5–5.1)
Sodium: 141 mEq/L (ref 135–145)

## 2018-02-15 LAB — LIPID PANEL
CHOL/HDL RATIO: 5
Cholesterol: 243 mg/dL — ABNORMAL HIGH (ref 0–200)
HDL: 46.8 mg/dL (ref >39.00)
LDL CALC: 159 mg/dL — AB (ref 0–99)
NONHDL: 195.75
TRIGLYCERIDES: 186 mg/dL — AB (ref 0.0–149.0)
VLDL: 37.2 mg/dL (ref 0.0–40.0)

## 2018-02-16 ENCOUNTER — Encounter: Payer: Self-pay | Admitting: *Deleted

## 2018-02-26 ENCOUNTER — Encounter: Payer: Self-pay | Admitting: Family Medicine

## 2018-04-06 DIAGNOSIS — G4733 Obstructive sleep apnea (adult) (pediatric): Secondary | ICD-10-CM | POA: Diagnosis not present

## 2018-08-17 ENCOUNTER — Encounter: Payer: Self-pay | Admitting: Family Medicine

## 2018-08-17 ENCOUNTER — Ambulatory Visit: Payer: PPO

## 2018-08-17 ENCOUNTER — Ambulatory Visit (INDEPENDENT_AMBULATORY_CARE_PROVIDER_SITE_OTHER): Payer: PPO | Admitting: Family Medicine

## 2018-08-17 VITALS — BP 125/69 | HR 58 | Temp 98.2°F | Resp 16 | Ht 68.0 in | Wt 225.2 lb

## 2018-08-17 DIAGNOSIS — Z1211 Encounter for screening for malignant neoplasm of colon: Secondary | ICD-10-CM

## 2018-08-17 DIAGNOSIS — E669 Obesity, unspecified: Secondary | ICD-10-CM | POA: Diagnosis not present

## 2018-08-17 DIAGNOSIS — E7800 Pure hypercholesterolemia, unspecified: Secondary | ICD-10-CM

## 2018-08-17 DIAGNOSIS — Z8639 Personal history of other endocrine, nutritional and metabolic disease: Secondary | ICD-10-CM

## 2018-08-17 DIAGNOSIS — I10 Essential (primary) hypertension: Secondary | ICD-10-CM | POA: Diagnosis not present

## 2018-08-17 DIAGNOSIS — Z23 Encounter for immunization: Secondary | ICD-10-CM

## 2018-08-17 DIAGNOSIS — Z0000 Encounter for general adult medical examination without abnormal findings: Secondary | ICD-10-CM | POA: Diagnosis not present

## 2018-08-17 LAB — COMPREHENSIVE METABOLIC PANEL
ALBUMIN: 4.5 g/dL (ref 3.5–5.2)
ALT: 20 U/L (ref 0–53)
AST: 16 U/L (ref 0–37)
Alkaline Phosphatase: 103 U/L (ref 39–117)
BILIRUBIN TOTAL: 0.7 mg/dL (ref 0.2–1.2)
BUN: 18 mg/dL (ref 6–23)
CO2: 30 meq/L (ref 19–32)
Calcium: 9.4 mg/dL (ref 8.4–10.5)
Chloride: 104 mEq/L (ref 96–112)
Creatinine, Ser: 1.01 mg/dL (ref 0.40–1.50)
GFR: 77.18 mL/min (ref >60.00)
Glucose, Bld: 137 mg/dL — ABNORMAL HIGH (ref 70–99)
Potassium: 4.8 mEq/L (ref 3.5–5.1)
SODIUM: 139 meq/L (ref 135–145)
Total Protein: 7 g/dL (ref 6.0–8.3)

## 2018-08-17 LAB — CBC WITH DIFFERENTIAL/PLATELET
Basophils Absolute: 0 10*3/uL (ref 0.0–0.1)
Basophils Relative: 1 % (ref 0.0–3.0)
Eosinophils Absolute: 0.1 10*3/uL (ref 0.0–0.7)
Eosinophils Relative: 1.8 % (ref 0.0–5.0)
HCT: 49.3 % (ref 39.0–52.0)
Hemoglobin: 16.9 g/dL (ref 13.0–17.0)
LYMPHS ABS: 1.7 10*3/uL (ref 0.7–4.0)
Lymphocytes Relative: 34.6 % (ref 12.0–46.0)
MCHC: 34.3 g/dL (ref 30.0–36.0)
MCV: 88.3 fl (ref 78.0–100.0)
MONO ABS: 0.4 10*3/uL (ref 0.1–1.0)
MONOS PCT: 7.7 % (ref 3.0–12.0)
NEUTROS ABS: 2.7 10*3/uL (ref 1.4–7.7)
NEUTROS PCT: 54.9 % (ref 43.0–77.0)
PLATELETS: 189 10*3/uL (ref 150.0–400.0)
RBC: 5.59 Mil/uL (ref 4.22–5.81)
RDW: 13.7 % (ref 11.5–15.5)
WBC: 5 10*3/uL (ref 4.0–10.5)

## 2018-08-17 LAB — HEMOGLOBIN A1C: HEMOGLOBIN A1C: 6.2 % (ref 4.6–6.5)

## 2018-08-17 LAB — LIPID PANEL
CHOLESTEROL: 240 mg/dL — AB (ref 0–200)
HDL: 42.5 mg/dL (ref >39.00)
LDL CALC: 169 mg/dL — AB (ref 0–99)
NonHDL: 197.36
Total CHOL/HDL Ratio: 6
Triglycerides: 143 mg/dL (ref 0.0–149.0)
VLDL: 28.6 mg/dL (ref 0.0–40.0)

## 2018-08-17 LAB — TSH: TSH: 1.62 u[IU]/mL (ref 0.35–4.50)

## 2018-08-17 NOTE — Progress Notes (Signed)
Office Note 08/17/2018  CC:  Chief Complaint  Patient presents with  . Annual Exam    Pt is fasitng.     HPI:  Wayne Lamb is a 72 y.o. White male who is here for annual health maintenance exam.  Exercising a few days a week, eats 1 meal a day usually, occ snacking.   Enjoys playing Airline pilot.  No acute complaints.  Past Medical History:  Diagnosis Date  . DM (diabetes mellitus), type 2 ([place]) 03/2016   HbA1c 6.2% on 01/18/17.  Initially dx'd by fasting glucose criteria.  . Elevated PSA    MRI prostate was done 2016 to eval persistently rising PSAs (no biopsy had been done): this showed no sign of prostate ca.  . Encounter for hepatitis C virus screening test for high risk patient    Done by [facility] physicians 01/18/17  . Essential hypertension    pt has declined to take med in past (as of 12/2016)  . GERD (gastroesophageal reflux disease)   . Hypercholesterolemia   . Lumbar spondylosis    MRI L spine 2005: severe facet deg, SI deg  . Obesity, Class II, BMI 35-39.9   . OSA on CPAP   . Prostate nodule 2016   prostate nodule or ridge in R prostate lobe--w/u reassuring 2016   . Rhinitis   . Tubular adenoma of colon 2014   recommend repeat colonoscopy 2019    Past Surgical History:  Procedure Laterality Date  . INGUINAL HERNIA REPAIR Left   . right clavicle fracture     pin inserted (Dr. Eddie Dibbles)  . TONSILLECTOMY      Family History  Problem Relation Age of Onset  . Alcohol abuse Father   . Early death Father 51       unknown cause of death  . Heart disease Mother   . Heart disease Sister   . Learning disabilities Brother        Dyslexia    Social History   Socioeconomic History  . Marital status: Married    Spouse name: Not on file  . Number of children: Not on file  . Years of education: Not on file  . Highest education level: Not on file  Occupational History  . Not on file  Social Needs  . Financial resource strain: Not on file  . Food  insecurity:    Worry: Not on file    Inability: Not on file  . Transportation needs:    Medical: Not on file    Non-medical: Not on file  Tobacco Use  . Smoking status: Former Smoker    Packs/day: 1.00    Years: 15.00    Pack years: 15.00    Types: Cigarettes    Last attempt to quit: 11/29/1979    Years since quitting: 38.7  . Smokeless tobacco: Never Used  Substance and Sexual Activity  . Alcohol use: Yes    Comment: occ  . Drug use: No  . Sexual activity: Not on file  Lifestyle  . Physical activity:    Days per week: Not on file    Minutes per session: Not on file  . Stress: Not on file  Relationships  . Social connections:    Talks on phone: Not on file    Gets together: Not on file    Attends religious service: Not on file    Active member of club or organization: Not on file    Attends meetings of clubs or organizations: Not  on file    Relationship status: Not on file  . Intimate partner violence:    Fear of current or ex partner: Not on file    Emotionally abused: Not on file    Physically abused: Not on file    Forced sexual activity: Not on file  Other Topics Concern  . Not on file  Social History Narrative   Married, 1 son who is an Forensic psychologist in [place].   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   Tob: former smoker, 15 pack-yr hx, quit 1970.   Alc: no   Exercise: 2-3 days per week, CV and wt training.    Outpatient Medications Prior to Visit  Medication Sig Dispense Refill  . Multiple Vitamins-Minerals (MULTIVITAMIN WITH MINERALS) tablet Take 1 tablet by mouth daily.     No facility-administered medications prior to visit.     No Known Allergies  ROS Review of Systems  Constitutional: Negative for appetite change, chills, fatigue and fever.  HENT: Negative for congestion, dental problem, ear pain and sore throat.   Eyes: Negative for discharge, redness and visual disturbance.  Respiratory: Negative for cough, chest tightness,  shortness of breath and wheezing.   Cardiovascular: Negative for chest pain, palpitations and leg swelling.  Gastrointestinal: Negative for abdominal pain, blood in stool, diarrhea, nausea and vomiting.  Genitourinary: Negative for difficulty urinating, dysuria, flank pain, frequency, hematuria and urgency.  Musculoskeletal: Negative for arthralgias, back pain, joint swelling, myalgias and neck stiffness.  Skin: Negative for pallor and rash.  Neurological: Negative for dizziness, speech difficulty, weakness and headaches.  Hematological: Negative for adenopathy. Does not bruise/bleed easily.  Psychiatric/Behavioral: Negative for confusion and sleep disturbance. The patient is not nervous/anxious.     PE; Blood pressure 125/69, pulse (!) 58, temperature 98.2 F (36.8 C), temperature source Oral, resp. rate 16, height 5\' 8"  (1.727 m), weight 225 lb 4 oz (102.2 kg), SpO2 95 %. Gen: Alert, well appearing.  Patient is oriented to person, place, time, and situation. AFFECT: pleasant, lucid thought and speech. ENT: Ears: EACs clear, normal epithelium.  TMs with good light reflex and landmarks bilaterally.  Eyes: no injection, icteris, swelling, or exudate.  EOMI, PERRLA. Nose: no drainage or turbinate edema/swelling.  No injection or focal lesion.  Mouth: lips without lesion/swelling.  Oral mucosa pink and moist.  Dentition intact and without obvious caries or gingival swelling.  Oropharynx without erythema, exudate, or swelling.  Neck: supple/nontender.  No LAD, mass, or TM.  Carotid pulses 2+ bilaterally, without bruits. CV: RRR, no m/r/g.   LUNGS: CTA bilat, nonlabored resps, good aeration in all lung fields. ABD: soft, NT, ND, BS normal.  No hepatospenomegaly or mass.  No bruits. EXT: no clubbing, cyanosis, or edema.  Musculoskeletal: no joint swelling, erythema, warmth, or tenderness.  ROM of all joints intact. Skin - no sores or suspicious lesions or rashes or color changes Rectal  deferred  Pertinent labs:  No results found for: TSH Lab Results  Component Value Date   WBC 4.7 08/16/2017   HGB 17.3 (H) 08/16/2017   HCT 50.5 08/16/2017   MCV 89.3 08/16/2017   PLT 181.0 08/16/2017   Lab Results  Component Value Date   CREATININE 0.97 02/14/2018   BUN 11 02/14/2018   NA 141 02/14/2018   K 4.7 02/14/2018   CL 104 02/14/2018   CO2 26 02/14/2018   Lab Results  Component Value Date   ALT 27 08/16/2017   AST 20  08/16/2017   ALKPHOS 89 08/16/2017   BILITOT 1.1 08/16/2017   Lab Results  Component Value Date   CHOL 243 (H) 02/14/2018   Lab Results  Component Value Date   HDL 46.80 02/14/2018   Lab Results  Component Value Date   LDLCALC 159 (H) 02/14/2018   Lab Results  Component Value Date   TRIG 186.0 (H) 02/14/2018   Lab Results  Component Value Date   CHOLHDL 5 02/14/2018   Lab Results  Component Value Date   PSA 5.67 (H) 02/14/2018   PSA 5.51 (H) 08/16/2017   PSA 4.83 02/16/2015   Lab Results  Component Value Date   HGBA1C 5.7 02/14/2018    ASSESSMENT AND PLAN:   Health maintenance exam: Reviewed age and gender appropriate health maintenance issues (prudent diet, regular exercise, health risks of tobacco and excessive alcohol, use of seatbelts, fire alarms in home, use of sunscreen).  Also reviewed age and gender appropriate health screening as well as vaccine recommendations. Vaccines: flu vaccine--> given today.   Shingrix -->rx sent to pharmacy today. Labs: fasting HP labs today. Prostate ca screening: hx of prostate nodule and elevated PSA.  Last PSA 01/2018 stable.  Will recheck PSA and do DRE at next f/u in 6 mo (discussed with pt today). Colon ca screening: adenomatous polyp 2014; due for repeat colonoscopy this year--BUT-he states he will not get any further colonoscopies.  An After Visit Summary was printed and given to the patient.  FOLLOW UP:  Return in about 6 months (around 02/15/2019).  Signed:  Crissie Sickles, MD            08/17/2018

## 2018-08-17 NOTE — Patient Instructions (Signed)

## 2018-09-21 ENCOUNTER — Ambulatory Visit (INDEPENDENT_AMBULATORY_CARE_PROVIDER_SITE_OTHER): Payer: PPO

## 2018-09-21 ENCOUNTER — Other Ambulatory Visit: Payer: Self-pay

## 2018-09-21 VITALS — BP 136/72 | HR 64 | Ht 68.0 in | Wt 213.4 lb

## 2018-09-21 DIAGNOSIS — E669 Obesity, unspecified: Secondary | ICD-10-CM | POA: Diagnosis not present

## 2018-09-21 DIAGNOSIS — Z0000 Encounter for general adult medical examination without abnormal findings: Secondary | ICD-10-CM | POA: Diagnosis not present

## 2018-09-21 NOTE — Progress Notes (Addendum)
Subjective:   Wayne Lamb is a 72 y.o. male who presents for Medicare Annual/Subsequent preventive examination.  Review of Systems:  No ROS.  Medicare Wellness Visit. Additional risk factors are reflected in the social history.  Cardiac Risk Factors include: advanced age (>26men, >36 women);male gender;hypertension;obesity (BMI >30kg/m2);family history of premature cardiovascular disease   Sleep patterns: Sleeps 6-8 hours, uses CPAP.  Home Safety/Smoke Alarms: Feels safe in home. Smoke alarms in place.  Living environment; residence and Firearm Safety: Lives with wife in 2 story home.  Seat Belt Safety/Bike Helmet: Wears seat belt.    Male:   CCS-11/28/2012, polyps. Declines future screening.      PSA-  Lab Results  Component Value Date   PSA 5.67 (H) 02/14/2018   PSA 5.51 (H) 08/16/2017   PSA 4.83 02/16/2015       Objective:    Vitals: BP 136/72 (BP Location: Left Arm, Patient Position: Sitting, Cuff Size: Normal)   Pulse 64   Ht 5\' 8"  (1.727 m)   Wt 213 lb 6 oz (96.8 kg)   SpO2 96%   BMI 32.44 kg/m   Body mass index is 32.44 kg/m.  Advanced Directives 09/21/2018 10/06/2017 06/08/2016  Does Patient Have a Medical Advance Directive? Yes Yes No  Type of Advance Directive Living will;Healthcare Power of Attorney Living will -  Copy of [place] in Chart? No - copy requested - -  Would patient like information on creating a medical advance directive? - - No - patient declined information    Tobacco Social History   Tobacco Use  Smoking Status Former Smoker  . Packs/day: 1.00  . Years: 15.00  . Pack years: 15.00  . Types: Cigarettes  . Last attempt to quit: 11/29/1979  . Years since quitting: 38.8  Smokeless Tobacco Never Used     Counseling given: Not Answered   Past Medical History:  Diagnosis Date  . DM (diabetes mellitus), type 2 ([place]) 03/2016   HbA1c 6.2% on 01/18/17.  Initially dx'd by fasting glucose criteria.  . Elevated PSA    MRI prostate was done 2016 to eval persistently rising PSAs (no biopsy had been done): this showed no sign of prostate ca.  . Encounter for hepatitis C virus screening test for high risk patient    Done by [facility] physicians 01/18/17  . Essential hypertension    pt has declined to take med in past (as of 12/2016)  . GERD (gastroesophageal reflux disease)   . Hypercholesterolemia   . Lumbar spondylosis    MRI L spine 2005: severe facet deg, SI deg  . Obesity, Class II, BMI 35-39.9   . OSA on CPAP   . Prostate nodule 2016   prostate nodule or ridge in R prostate lobe--w/u reassuring 2016   . Rhinitis   . Tubular adenoma of colon 2014   recommend repeat colonoscopy 2019   Past Surgical History:  Procedure Laterality Date  . INGUINAL HERNIA REPAIR Left   . right clavicle fracture     pin inserted (Dr. Eddie Dibbles)  . TONSILLECTOMY     Family History  Problem Relation Age of Onset  . Alcohol abuse Father   . Early death Father 46       unknown cause of death  . Heart disease Mother   . Heart disease Sister   . Learning disabilities Brother        Dyslexia   Social History   Socioeconomic History  . Marital status: Married  Spouse name: Not on file  . Number of children: Not on file  . Years of education: Not on file  . Highest education level: Not on file  Occupational History  . Not on file  Social Needs  . Financial resource strain: Not on file  . Food insecurity:    Worry: Not on file    Inability: Not on file  . Transportation needs:    Medical: Not on file    Non-medical: Not on file  Tobacco Use  . Smoking status: Former Smoker    Packs/day: 1.00    Years: 15.00    Pack years: 15.00    Types: Cigarettes    Last attempt to quit: 11/29/1979    Years since quitting: 38.8  . Smokeless tobacco: Never Used  Substance and Sexual Activity  . Alcohol use: Yes    Comment: occ  . Drug use: No  . Sexual activity: Not on file  Lifestyle  . Physical activity:    Days per  week: Not on file    Minutes per session: Not on file  . Stress: Not on file  Relationships  . Social connections:    Talks on phone: Not on file    Gets together: Not on file    Attends religious service: Not on file    Active member of club or organization: Not on file    Attends meetings of clubs or organizations: Not on file    Relationship status: Not on file  Other Topics Concern  . Not on file  Social History Narrative   Married, 1 son who is an Forensic psychologist in [place].   HE GREW UP IN [place]---WENT TO [employer]!   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   Tob: former smoker, 15 pack-yr hx, quit 1970.   Alc: no   Exercise: 2-3 days per week, CV and wt training.    Outpatient Encounter Medications as of 09/21/2018  Medication Sig  . Multiple Vitamins-Minerals (MULTIVITAMIN WITH MINERALS) tablet Take 1 tablet by mouth daily.   No facility-administered encounter medications on file as of 09/21/2018.     Activities of Daily Living In your present state of health, do you have any difficulty performing the following activities: 09/21/2018  Hearing? N  Vision? N  Difficulty concentrating or making decisions? N  Walking or climbing stairs? N  Dressing or bathing? N  Doing errands, shopping? N  Preparing Food and eating ? N  Using the Toilet? N  In the past six months, have you accidently leaked urine? N  Do you have problems with loss of bowel control? N  Managing your Medications? N  Managing your Finances? N  Housekeeping or managing your Housekeeping? N  Some recent data might be hidden    Patient Care Team: [name], MD as PCP - General (Family Medicine) Deneise Lever, MD as Consulting Physician (Pulmonary Disease) Festus Aloe, MD as Consulting Physician (Urology) Christell Faith   Assessment:   This is a routine wellness examination for Javyn.  Exercise Activities and Dietary recommendations Current Exercise Habits:  Structured exercise class, Type of exercise: strength training/weights, Frequency (Times/Week): 4, Exercise limited by: None identified   Diet (meal preparation, eat out, water intake, caffeinated beverages, dairy products, fruits and vegetables): Drinks water  Breakfast: bullet proof coffee; beef broth coffee Lunch: bacon/eggs; salad bar w/protein Dinner: protein and vegetables  Following Keto diet.   Goals    . Weight (  lb) < 180 lb (81.6 kg)     Lose weight by staying active and eating well.        Fall Risk Fall Risk  09/21/2018 08/17/2018 08/16/2017 06/08/2016  Falls in the past year? No No No No    Depression Screen PHQ 2/9 Scores 09/21/2018 08/17/2018 08/16/2017 06/08/2016  PHQ - 2 Score 0 0 0 0    Cognitive Function MMSE - Mini Mental State Exam 09/21/2018  Orientation to time 5  Orientation to Place 5  Registration 3  Attention/ Calculation 5  Recall 3  Language- name 2 objects 2  Language- repeat 1  Language- follow 3 step command 3  Language- read & follow direction 1  Write a sentence 1  Copy design 1  Total score 30        Immunization History  Administered Date(s) Administered  . Influenza, High Dose Seasonal PF 08/16/2017, 08/17/2018  . Pneumococcal Conjugate-13 02/16/2015  . Pneumococcal Polysaccharide-23 01/21/2013  . Td 01/18/2017  . Zoster 01/21/2013   Declines Shingrix Vaccine.   Screening Tests Health Maintenance  Topic Date Due  . OPHTHALMOLOGY EXAM  08/21/1956  . COLONOSCOPY  08/21/1996  . HEMOGLOBIN A1C  02/15/2019  . URINE MICROALBUMIN  02/15/2019  . FOOT EXAM  08/18/2019  . TETANUS/TDAP  01/18/2027  . INFLUENZA VACCINE  Completed  . Hepatitis C Screening  Completed  . PNA vac Low Risk Adult  Completed        Plan:    Schedule eye exam.   Bring a copy of your living will and/or healthcare power of attorney to your next office visit.  Continue doing brain stimulating activities (puzzles, reading, adult coloring books,  staying active) to keep memory sharp.    I have personally reviewed and noted the following in the patient's chart:   . Medical and social history . Use of alcohol, tobacco or illicit drugs  . Current medications and supplements . Functional ability and status . Nutritional status . Physical activity . Advanced directives . List of other physicians . Hospitalizations, surgeries, and ER visits in previous 12 months . Vitals . Screenings to include cognitive, depression, and falls . Referrals and appointments  In addition, I have reviewed and discussed with patient certain preventive protocols, quality metrics, and best practice recommendations. A written personalized care plan for preventive services as well as general preventive health recommendations were provided to patient.     Gerilyn Nestle, RN  09/21/2018  F/U with PCP 02/14/2018   Addendum 09/25/18: AWV reviewed and agree. Signed:  Crissie Sickles, MD           09/25/2018

## 2018-09-21 NOTE — Patient Instructions (Addendum)
Schedule eye exam.   Bring a copy of your living will and/or healthcare power of attorney to your next office visit.  Continue doing brain stimulating activities (puzzles, reading, adult coloring books, staying active) to keep memory sharp.   Health Maintenance, Male A healthy lifestyle and preventive care is important for your health and wellness. Ask your health care provider about what schedule of regular examinations is right for you. What should I know about weight and diet? Eat a Healthy Diet  Eat plenty of vegetables, fruits, whole grains, low-fat dairy products, and lean protein.  Do not eat a lot of foods high in solid fats, added sugars, or salt.  Maintain a Healthy Weight Regular exercise can help you achieve or maintain a healthy weight. You should:  Do at least 150 minutes of exercise each week. The exercise should increase your heart rate and make you sweat (moderate-intensity exercise).  Do strength-training exercises at least twice a week.  Watch Your Levels of Cholesterol and Blood Lipids  Have your blood tested for lipids and cholesterol every 5 years starting at 72 years of age. If you are at high risk for heart disease, you should start having your blood tested when you are 72 years old. You may need to have your cholesterol levels checked more often if: ? Your lipid or cholesterol levels are high. ? You are older than 72 years of age. ? You are at high risk for heart disease.  What should I know about cancer screening? Many types of cancers can be detected early and may often be prevented. Lung Cancer  You should be screened every year for lung cancer if: ? You are a current smoker who has smoked for at least 30 years. ? You are a former smoker who has quit within the past 15 years.  Talk to your health care provider about your screening options, when you should start screening, and how often you should be screened.  Colorectal Cancer  Routine colorectal  cancer screening usually begins at 72 years of age and should be repeated every 5-10 years until you are 72 years old. You may need to be screened more often if early forms of precancerous polyps or small growths are found. Your health care provider may recommend screening at an earlier age if you have risk factors for colon cancer.  Your health care provider may recommend using home test kits to check for hidden blood in the stool.  A small camera at the end of a tube can be used to examine your colon (sigmoidoscopy or colonoscopy). This checks for the earliest forms of colorectal cancer.  Prostate and Testicular Cancer  Depending on your age and overall health, your health care provider may do certain tests to screen for prostate and testicular cancer.  Talk to your health care provider about any symptoms or concerns you have about testicular or prostate cancer.  Skin Cancer  Check your skin from head to toe regularly.  Tell your health care provider about any new moles or changes in moles, especially if: ? There is a change in a mole's size, shape, or color. ? You have a mole that is larger than a pencil eraser.  Always use sunscreen. Apply sunscreen liberally and repeat throughout the day.  Protect yourself by wearing long sleeves, pants, a wide-brimmed hat, and sunglasses when outside.  What should I know about heart disease, diabetes, and high blood pressure?  If you are 52-37 years of age, have your  blood pressure checked every 3-5 years. If you are 85 years of age or older, have your blood pressure checked every year. You should have your blood pressure measured twice-once when you are at a hospital or clinic, and once when you are not at a hospital or clinic. Record the average of the two measurements. To check your blood pressure when you are not at a hospital or clinic, you can use: ? An automated blood pressure machine at a pharmacy. ? A home blood pressure monitor.  Talk to  your health care provider about your target blood pressure.  If you are between 83-30 years old, ask your health care provider if you should take aspirin to prevent heart disease.  Have regular diabetes screenings by checking your fasting blood sugar level. ? If you are at a normal weight and have a low risk for diabetes, have this test once every three years after the age of 89. ? If you are overweight and have a high risk for diabetes, consider being tested at a younger age or more often.  A one-time screening for abdominal aortic aneurysm (AAA) by ultrasound is recommended for men aged 64-75 years who are current or former smokers. What should I know about preventing infection? Hepatitis B If you have a higher risk for hepatitis B, you should be screened for this virus. Talk with your health care provider to find out if you are at risk for hepatitis B infection. Hepatitis C Blood testing is recommended for:  Everyone born from [birth year] through 1965.  Anyone with known risk factors for hepatitis C.  Sexually Transmitted Diseases (STDs)  You should be screened each year for STDs including gonorrhea and chlamydia if: ? You are sexually active and are younger than 72 years of age. ? You are older than 72 years of age and your health care provider tells you that you are at risk for this type of infection. ? Your sexual activity has changed since you were last screened and you are at an increased risk for chlamydia or gonorrhea. Ask your health care provider if you are at risk.  Talk with your health care provider about whether you are at high risk of being infected with HIV. Your health care provider may recommend a prescription medicine to help prevent HIV infection.  What else can I do?  Schedule regular health, dental, and eye exams.  Stay current with your vaccines (immunizations).  Do not use any tobacco products, such as cigarettes, chewing tobacco, and e-cigarettes. If you need  help quitting, ask your health care provider.  Limit alcohol intake to no more than 2 drinks per day. One drink equals 12 ounces of beer, 5 ounces of wine, or 1 ounces of hard liquor.  Do not use street drugs.  Do not share needles.  Ask your health care provider for help if you need support or information about quitting drugs.  Tell your health care provider if you often feel depressed.  Tell your health care provider if you have ever been abused or do not feel safe at home. This information is not intended to replace advice given to you by your health care provider. Make sure you discuss any questions you have with your health care provider. Document Released: 05/12/2008 Document Revised: 07/13/2016 Document Reviewed: 08/18/2015 Elsevier Interactive Patient Education  [employer]. 0/20/2019

## 2018-11-23 DIAGNOSIS — S61205A Unspecified open wound of left ring finger without damage to nail, initial encounter: Secondary | ICD-10-CM | POA: Diagnosis not present

## 2019-02-15 ENCOUNTER — Ambulatory Visit (INDEPENDENT_AMBULATORY_CARE_PROVIDER_SITE_OTHER): Payer: PPO | Admitting: Family Medicine

## 2019-02-15 ENCOUNTER — Encounter: Payer: Self-pay | Admitting: Family Medicine

## 2019-02-15 ENCOUNTER — Other Ambulatory Visit: Payer: Self-pay

## 2019-02-15 ENCOUNTER — Telehealth: Payer: Self-pay

## 2019-02-15 VITALS — BP 150/76 | HR 61 | Temp 97.5°F | Resp 16 | Ht 68.0 in | Wt 211.6 lb

## 2019-02-15 DIAGNOSIS — E119 Type 2 diabetes mellitus without complications: Secondary | ICD-10-CM | POA: Diagnosis not present

## 2019-02-15 DIAGNOSIS — I10 Essential (primary) hypertension: Secondary | ICD-10-CM

## 2019-02-15 DIAGNOSIS — R972 Elevated prostate specific antigen [PSA]: Secondary | ICD-10-CM | POA: Diagnosis not present

## 2019-02-15 DIAGNOSIS — E7800 Pure hypercholesterolemia, unspecified: Secondary | ICD-10-CM | POA: Diagnosis not present

## 2019-02-15 DIAGNOSIS — E669 Obesity, unspecified: Secondary | ICD-10-CM

## 2019-02-15 LAB — COMPREHENSIVE METABOLIC PANEL
ALBUMIN: 4.6 g/dL (ref 3.5–5.2)
ALK PHOS: 84 U/L (ref 39–117)
ALT: 14 U/L (ref 0–53)
AST: 13 U/L (ref 0–37)
BUN: 22 mg/dL (ref 6–23)
CALCIUM: 9.4 mg/dL (ref 8.4–10.5)
CO2: 27 mEq/L (ref 19–32)
Chloride: 103 mEq/L (ref 96–112)
Creatinine, Ser: 1.14 mg/dL (ref 0.40–1.50)
GFR: 63.06 mL/min (ref >60.00)
Glucose, Bld: 112 mg/dL — ABNORMAL HIGH (ref 70–99)
POTASSIUM: 4.1 meq/L (ref 3.5–5.1)
Sodium: 139 mEq/L (ref 135–145)
TOTAL PROTEIN: 7 g/dL (ref 6.0–8.3)
Total Bilirubin: 1.3 mg/dL — ABNORMAL HIGH (ref 0.2–1.2)

## 2019-02-15 LAB — HEMOGLOBIN A1C: HEMOGLOBIN A1C: 5.7 % (ref 4.6–6.5)

## 2019-02-15 LAB — LIPID PANEL
CHOLESTEROL: 272 mg/dL — AB (ref 0–200)
HDL: 39 mg/dL — AB (ref >39.00)
LDL CALC: 208 mg/dL — AB (ref 0–99)
NonHDL: 233.26
Total CHOL/HDL Ratio: 7
Triglycerides: 125 mg/dL (ref 0.0–149.0)
VLDL: 25 mg/dL (ref 0.0–40.0)

## 2019-02-15 LAB — PSA, MEDICARE: PSA: 5.59 ng/ml — ABNORMAL HIGH (ref 0.10–4.00)

## 2019-02-15 NOTE — Progress Notes (Signed)
OFFICE VISIT  02/15/2019   CC:  Chief Complaint  Patient presents with  . Follow-up    RCI, pt is fasting     HPI:    Patient is a 73 y.o. Caucasian male who presents for 6 mo f/u HTN, HLD, DM 2. No acute complaints--feeling well.  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  HTN: has hx of declining meds for this problem. Home bp ranges 135-160/upper 70s to low80s. He does have some "natural things" he used to take but needs to be more vigilant. Still working out regularly.   HLD: has hx of declining meds for this problem. He does want to continue monitoring this with lab today.  DM: this has historically only been diagnosed by fasting glucose criteria and A1c's have never been > 6.2%.    Hx of elevated PSA's.  MRI prostate not concerning enough for bx to be done. Will follow PSA today. He no longer follows with urology.  ROS: no CP, no SOB, no wheezing, no cough, no dizziness, no HAs, no rashes, no melena/hematochezia.  No polyuria or polydipsia.  No myalgias or arthralgias.   Past Medical History:  Diagnosis Date  . DM (diabetes mellitus), type 2 ([place]) 03/2016   HbA1c 6.2% on 01/18/17.  Initially dx'd by fasting glucose criteria.  . Elevated PSA    MRI prostate was done 2016 to eval persistently rising PSAs (no biopsy had been done): this showed no sign of prostate ca.  . Encounter for hepatitis C virus screening test for high risk patient    Done by [facility] physicians 01/18/17  . Essential hypertension    pt has declined to take med in past (as of 12/2016)  . GERD (gastroesophageal reflux disease)   . Hypercholesterolemia   . Lumbar spondylosis    MRI L spine 2005: severe facet deg, SI deg  . Obesity, Class II, BMI 35-39.9   . OSA on CPAP   . Prostate nodule 2016   prostate nodule or ridge in R prostate lobe--w/u reassuring 2016   . Rhinitis   . Tubular adenoma of colon 2014   recommend repeat colonoscopy 2019    Past Surgical  History:  Procedure Laterality Date  . INGUINAL HERNIA REPAIR Left   . right clavicle fracture     pin inserted (Dr. Eddie Dibbles)  . TONSILLECTOMY      Outpatient Medications Prior to Visit  Medication Sig Dispense Refill  . Multiple Vitamins-Minerals (MULTIVITAMIN WITH MINERALS) tablet Take 1 tablet by mouth daily.     No facility-administered medications prior to visit.     No Known Allergies  ROS As per HPI  PE: Blood pressure (!) 150/76, pulse 61, temperature (!) 97.5 F (36.4 C), temperature source Oral, resp. rate 16, height 5\' 8"  (1.727 m), weight 211 lb 9.6 oz (96 kg), SpO2 94 %. Body mass index is 32.17 kg/m.  Gen: Alert, well appearing.  Patient is oriented to person, place, time, and situation. AFFECT: pleasant, lucid thought and speech. CV: RRR, no m/r/g.   LUNGS: CTA bilat, nonlabored resps, good aeration in all lung fields. EXT: no clubbing or cyanosis.  no edema.    LABS:  Lab Results  Component Value Date   TSH 1.62 08/17/2018   Lab Results  Component Value Date   WBC 5.0 08/17/2018   HGB 16.9 08/17/2018   HCT 49.3 08/17/2018   MCV 88.3 08/17/2018   PLT 189.0 08/17/2018   Lab Results  Component Value  Date   CREATININE 1.01 08/17/2018   BUN 18 08/17/2018   NA 139 08/17/2018   K 4.8 08/17/2018   CL 104 08/17/2018   CO2 30 08/17/2018   Lab Results  Component Value Date   ALT 20 08/17/2018   AST 16 08/17/2018   ALKPHOS 103 08/17/2018   BILITOT 0.7 08/17/2018   Lab Results  Component Value Date   CHOL 240 (H) 08/17/2018   Lab Results  Component Value Date   HDL 42.50 08/17/2018   Lab Results  Component Value Date   LDLCALC 169 (H) 08/17/2018   Lab Results  Component Value Date   TRIG 143.0 08/17/2018   Lab Results  Component Value Date   CHOLHDL 6 08/17/2018   Lab Results  Component Value Date   PSA 5.67 (H) 02/14/2018   PSA 5.51 (H) 08/16/2017   PSA 4.83 02/16/2015   Lab Results  Component Value Date   HGBA1C 6.2 08/17/2018     IMPRESSION AND PLAN:  1) HTN: not well controlled but not horrible. He declines bp med.  He wants to get back on his "naturals" that he cannot recall the names of today. Lytes/cr today.  2) HLD: again, he is working on [employer] pretty well lately and he is not interested in any trial of med for this problem. FLP today.  Hepatic panel today.  3) DM 2: no special dieting but he exercises regularly. No meds at this time, nor is he agreeable to any in the near future unless A1c gets real bad. HbA1c today.  4) Hx of elevated PSA: MRI prostate -->not suspicious for any cancer.  No bx recommended. He chooses to NOT continue routine urologic f/u at this time, wants me to follow PSA's and we'll get him back to urology if needed.  PSA today.  An After Visit Summary was printed and given to the patient.  FOLLOW UP: Return in about 6 months (around 08/18/2019) for annual CPE (fasting).  Signed:  Crissie Sickles, MD           02/15/2019

## 2019-02-15 NOTE — Telephone Encounter (Signed)
-----   Message from [name], MD sent at 02/15/2019  4:53 PM EDT ----- HbA1c much improved: down to 5.7% from 6.2% last check six months ago. Cholesterol numbers are up compared to last visit.  Continue to work on diet and exercise. PSA was 5.59-->the same as 1 yr ago and 18 months ago.  No new recommendations.-thx

## 2019-03-27 DIAGNOSIS — G4733 Obstructive sleep apnea (adult) (pediatric): Secondary | ICD-10-CM | POA: Diagnosis not present

## 2019-06-03 ENCOUNTER — Telehealth: Payer: Self-pay

## 2019-06-03 ENCOUNTER — Ambulatory Visit: Payer: Self-pay | Admitting: *Deleted

## 2019-06-03 NOTE — Telephone Encounter (Addendum)
LM for pt to return call regarding if he had symptoms or not, can provide alternative testing site if no symptoms.  Pt was provided information for [facility] testing

## 2019-06-03 NOTE — Telephone Encounter (Signed)
Copied from [place] [id number]. Topic: General - Other >> Jun 03, 2019  4:42 PM Mcneil, Ja-Kwan wrote: Reason for CRM: Pt returned call to office. Pt stated he is not experiencing any symptoms at this time but would like to be tested as soon as possible because he planned to leave to go and assist a friend who recently had surgery at the end of the week. Pt requests call back.  Pt was called and given number to [facility] testing site.

## 2019-06-03 NOTE — Telephone Encounter (Signed)
Patient is calling to request COVID testing- he is traveling to [facility] to help take care of friend who has just had brain surgery and need to make sure he is not positive before he goes.  Patient is planning on leaving on Thursday.  Patient needs information about getting tested if ha does not fit criteria for [facility] testing site.  Reason for Disposition . [1] Caller requesting NON-URGENT health information AND [2] PCP's office is the best resource  Answer Assessment - Initial Assessment Questions 1. REASON FOR CALL or QUESTION: "What is your reason for calling today?" or "How can I best help you?" or "What question do you have that I can help answer?"     Patient is requesting COVID testing  Protocols used: INFORMATION ONLY CALL-A-AH

## 2019-06-04 DIAGNOSIS — Z20828 Contact with and (suspected) exposure to other viral communicable diseases: Secondary | ICD-10-CM | POA: Diagnosis not present

## 2019-07-04 NOTE — Telephone Encounter (Signed)
Please advise, did our office ever receive the test results? I do not see them in his chart.

## 2019-07-04 NOTE — Telephone Encounter (Signed)
Patient was advised to contact testing site but declined since test was done a month ago and does not have symptoms. Results were not sent to our office.

## 2019-07-04 NOTE — Telephone Encounter (Signed)
Pt says that he went to have Covid test at the [place]. center with the health department. Pt says that he asked that they fax over results to PCP. Pt would like to confirm if received?    Pt says that he had test completed on 06/04/19 and still has not received result.

## 2019-07-10 DIAGNOSIS — H04123 Dry eye syndrome of bilateral lacrimal glands: Secondary | ICD-10-CM | POA: Diagnosis not present

## 2019-07-10 DIAGNOSIS — H43812 Vitreous degeneration, left eye: Secondary | ICD-10-CM | POA: Diagnosis not present

## 2019-07-10 DIAGNOSIS — H2513 Age-related nuclear cataract, bilateral: Secondary | ICD-10-CM | POA: Diagnosis not present

## 2019-07-10 DIAGNOSIS — H02834 Dermatochalasis of left upper eyelid: Secondary | ICD-10-CM | POA: Diagnosis not present

## 2019-07-10 DIAGNOSIS — H10413 Chronic giant papillary conjunctivitis, bilateral: Secondary | ICD-10-CM | POA: Diagnosis not present

## 2019-07-10 DIAGNOSIS — H02831 Dermatochalasis of right upper eyelid: Secondary | ICD-10-CM | POA: Diagnosis not present

## 2019-07-15 ENCOUNTER — Telehealth: Payer: Self-pay | Admitting: Family Medicine

## 2019-07-15 ENCOUNTER — Other Ambulatory Visit: Payer: Self-pay | Admitting: Family Medicine

## 2019-07-15 MED ORDER — LISINOPRIL 20 MG PO TABS: 20.0000 mg | ORAL_TABLET | Freq: Every day | ORAL | 0 refills | Status: DC

## 2019-07-15 NOTE — Telephone Encounter (Signed)
Contacted patient, no other symptoms. He was visiting a friend that was checking theirs and is in [place], [place]. He is not currently on any BP meds.

## 2019-07-15 NOTE — Telephone Encounter (Signed)
Is he having any symptoms? (Headache, dizziness, vision changes, weakness, chest pain, shortness of breath, palpitations?) What made him check his blood pressure?   Where is he out of town?

## 2019-07-15 NOTE — Telephone Encounter (Signed)
Find out a pharmacy I can send a bp med to. Tell him his bp has likely been up for quite a while, and we don't want to bring it down too quickly but we DO need to get him started on a bp med now. Have him make f/u appt to see me (in office preferred but telemed ok) towards the end of this week.-thx

## 2019-07-15 NOTE — Telephone Encounter (Signed)
Please advise, thanks.

## 2019-07-15 NOTE — Telephone Encounter (Signed)
Ok to work-in if no slot can be opened.

## 2019-07-15 NOTE — Telephone Encounter (Signed)
Pt called and said that he has had some concerns with his BP and that yesterday morning when he took it, it read 184/110 and this morning first thing it read 191/106 so he waited a little bit and took it again and it was 166/101. He said he is out of town right now and wanted to know what he should do.

## 2019-07-15 NOTE — Telephone Encounter (Signed)
Patient uses [facility] in [place]. He will have to be worked in, some slots blocked for same day appts

## 2019-07-15 NOTE — Telephone Encounter (Signed)
Tried scheduling for Friday at 2:30 but slot is blocked or unavailable.

## 2019-07-19 ENCOUNTER — Encounter: Payer: Self-pay | Admitting: Family Medicine

## 2019-07-19 ENCOUNTER — Other Ambulatory Visit: Payer: Self-pay

## 2019-07-19 ENCOUNTER — Ambulatory Visit (INDEPENDENT_AMBULATORY_CARE_PROVIDER_SITE_OTHER): Payer: PPO | Admitting: Family Medicine

## 2019-07-19 VITALS — BP 133/90 | HR 70 | Temp 97.8°F | Resp 16 | Ht 68.0 in | Wt 228.4 lb

## 2019-07-19 DIAGNOSIS — I10 Essential (primary) hypertension: Secondary | ICD-10-CM

## 2019-07-19 DIAGNOSIS — Z23 Encounter for immunization: Secondary | ICD-10-CM

## 2019-07-19 LAB — POCT URINALYSIS DIPSTICK
Bilirubin, UA: NEGATIVE
Blood, UA: NEGATIVE
Glucose, UA: NEGATIVE
Leukocytes, UA: NEGATIVE
Nitrite, UA: NEGATIVE
Protein, UA: POSITIVE — AB
Spec Grav, UA: >=1.03 — AB (ref 1.010–1.025)
Urobilinogen, UA: 0.2 E.U./dL
pH, UA: 5.5 (ref 5.0–8.0)

## 2019-07-19 NOTE — Addendum Note (Signed)
Addended by: Caroll Rancher L on: 07/19/2019 03:13 PM   Modules accepted: Orders

## 2019-07-19 NOTE — Progress Notes (Signed)
OFFICE VISIT  07/19/2019   CC:  Chief Complaint  Patient presents with  . Follow-up    HTN F/U. Pt checks at home and its been running 170/s/90's. Pt just got RX yesterday and started today.    HPI:    Patient is a 73 y.o. Caucasian male who presents for f/u HTN. Four days ago he checked his bp while out of town in [place] (pt was asymptomatic.  Says he checked his bp b/c the friends he was visiting were checking theirs) and it was very high (160-180/100-110). He called our office that day to report this. I sent in lisinopril 20mg  qd for him to start.  He does have a hx of HTN but has always felt that medications were not necessary even though prior PCPs as well as myself recommended meds.  Interim hx: Home bp's since he got home 160s-170s/90s.  Started lisin this morning. No recent otc cold meds, caffeine 2 cups qAM as per his usual. Compliant with CPAP. Diet horrible. Exercise usually good but not lately.   Past Medical History:  Diagnosis Date  . DM (diabetes mellitus), type 2 ([place]) 03/2016   HbA1c 6.2% on 01/18/17.  Initially dx'd by fasting glucose criteria.  . Elevated PSA    MRI prostate was done 2016 to eval persistently rising PSAs (no biopsy had been done): this showed no sign of prostate ca.  Stable 07/2017, 01/2018, and 01/2019.  [place] Kitchen Encounter for hepatitis C virus screening test for high risk patient    Done by [facility] physicians 01/18/17  . Essential hypertension    pt has declined to take med in past (as of 12/2016)  . GERD (gastroesophageal reflux disease)   . Hypercholesterolemia   . Lumbar spondylosis    MRI L spine 2005: severe facet deg, SI deg  . Obesity, Class II, BMI 35-39.9   . OSA on CPAP   . Prostate nodule 2016   prostate nodule or ridge in R prostate lobe--w/u reassuring 2016   . Rhinitis   . Tubular adenoma of colon 2014   recommend repeat colonoscopy 2019    Past Surgical History:  Procedure Laterality Date  . INGUINAL HERNIA REPAIR Left   .  right clavicle fracture     pin inserted (Dr. Eddie Dibbles)  . TONSILLECTOMY      Outpatient Medications Prior to Visit  Medication Sig Dispense Refill  . lisinopril (ZESTRIL) 20 MG tablet Take 1 tablet (20 mg total) by mouth daily. 30 tablet 0  . Multiple Vitamins-Minerals (MULTIVITAMIN WITH MINERALS) tablet Take 1 tablet by mouth daily.     No facility-administered medications prior to visit.     No Known Allergies  ROS As per HPI  PE: Blood pressure 133/90, pulse 70, temperature 97.8 F (36.6 C), temperature source Temporal, resp. rate 16, height 5\' 8"  (1.727 m), weight 228 lb 6 oz (103.6 kg), SpO2 93 %. Gen: Alert, well appearing.  Patient is oriented to person, place, time, and situation. AFFECT: pleasant, lucid thought and speech. [id number]: no injection, icteris, swelling, or exudate.  EOMI, PERRLA. Mouth: lips without lesion/swelling.  Oral mucosa pink and moist. Oropharynx without erythema, exudate, or swelling.  Neck: supple/nontender.  No LAD, mass, or TM.  Carotid pulses 2+ bilaterally, without bruits. CV: RRR, no m/r/g.   LUNGS: CTA bilat, nonlabored resps, good aeration in all lung fields. ABD: soft, NT, ND, BS normal.  No hepatospenomegaly or mass.  No bruits. Large rectus diastasis. EXT: no clubbing or cyanosis.  no  edema.    LABS:    Chemistry      Component Value Date/Time   NA 139 02/15/2019 0959   NA 139 01/18/2017   K 4.1 02/15/2019 0959   CL 103 02/15/2019 0959   CO2 27 02/15/2019 0959   BUN 22 02/15/2019 0959   BUN 16 01/18/2017   CREATININE 1.14 02/15/2019 0959   GLU 140 01/18/2017      Component Value Date/Time   CALCIUM 9.4 02/15/2019 0959   ALKPHOS 84 02/15/2019 0959   AST 13 02/15/2019 0959   ALT 14 02/15/2019 0959   BILITOT 1.3 (H) 02/15/2019 0959     Dipstick UA today: trace protein and trace ketones, o/w normal.  IMPRESSION AND PLAN:  Uncontrolled HTN: was on no meds for a long time per his preference.  He is asymptomatic.  No sign at  this time of any typical cause of secondary HTN. He just now started lisinopril 20mg  today.  BP pretty good here today. Continue same dose for now. Instructions: Check your blood pressure and heart rate daiily and write the numbers down. Bring these in to review with me at follow up visit in 2 wks.  BMET today and consider repeat at f/u in 2 wks. UA today normal.  Flu vaccine today.  An After Visit Summary was printed and given to the patient.  FOLLOW UP: Return in about 2 weeks (around 08/02/2019) for f/u HTN.  Signed:  Crissie Sickles, MD           07/19/2019

## 2019-07-19 NOTE — Patient Instructions (Signed)
Check your blood pressure and heart rate daiily and write the numbers down. Bring these in to review with me at follow up visit in 2 wks.

## 2019-07-20 LAB — BASIC METABOLIC PANEL
BUN: 18 mg/dL (ref 7–25)
CO2: 23 mmol/L (ref 20–32)
Calcium: 9.5 mg/dL (ref 8.6–10.3)
Chloride: 103 mmol/L (ref 98–110)
Creat: 1.09 mg/dL (ref 0.70–1.18)
Glucose, Bld: 118 mg/dL — ABNORMAL HIGH (ref 65–99)
Potassium: 4.3 mmol/L (ref 3.5–5.3)
Sodium: 139 mmol/L (ref 135–146)

## 2019-08-02 DIAGNOSIS — G4733 Obstructive sleep apnea (adult) (pediatric): Secondary | ICD-10-CM | POA: Diagnosis not present

## 2019-08-10 ENCOUNTER — Other Ambulatory Visit: Payer: Self-pay | Admitting: Family Medicine

## 2019-08-14 ENCOUNTER — Ambulatory Visit: Payer: PPO | Admitting: Family Medicine

## 2019-08-23 ENCOUNTER — Encounter: Payer: Self-pay | Admitting: Family Medicine

## 2019-08-23 ENCOUNTER — Other Ambulatory Visit: Payer: Self-pay

## 2019-08-23 ENCOUNTER — Ambulatory Visit (INDEPENDENT_AMBULATORY_CARE_PROVIDER_SITE_OTHER): Payer: PPO | Admitting: Family Medicine

## 2019-08-23 VITALS — BP 148/77 | HR 63 | Temp 97.3°F | Resp 16 | Ht 68.0 in | Wt 230.2 lb

## 2019-08-23 DIAGNOSIS — M25561 Pain in right knee: Secondary | ICD-10-CM | POA: Diagnosis not present

## 2019-08-23 DIAGNOSIS — M222X2 Patellofemoral disorders, left knee: Secondary | ICD-10-CM

## 2019-08-23 DIAGNOSIS — I10 Essential (primary) hypertension: Secondary | ICD-10-CM

## 2019-08-23 DIAGNOSIS — Z9989 Dependence on other enabling machines and devices: Secondary | ICD-10-CM | POA: Diagnosis not present

## 2019-08-23 DIAGNOSIS — E119 Type 2 diabetes mellitus without complications: Secondary | ICD-10-CM

## 2019-08-23 DIAGNOSIS — E669 Obesity, unspecified: Secondary | ICD-10-CM | POA: Diagnosis not present

## 2019-08-23 DIAGNOSIS — M25562 Pain in left knee: Secondary | ICD-10-CM | POA: Diagnosis not present

## 2019-08-23 DIAGNOSIS — G4733 Obstructive sleep apnea (adult) (pediatric): Secondary | ICD-10-CM | POA: Diagnosis not present

## 2019-08-23 DIAGNOSIS — G8929 Other chronic pain: Secondary | ICD-10-CM

## 2019-08-23 DIAGNOSIS — R972 Elevated prostate specific antigen [PSA]: Secondary | ICD-10-CM

## 2019-08-23 DIAGNOSIS — Z23 Encounter for immunization: Secondary | ICD-10-CM | POA: Diagnosis not present

## 2019-08-23 DIAGNOSIS — M222X1 Patellofemoral disorders, right knee: Secondary | ICD-10-CM | POA: Diagnosis not present

## 2019-08-23 DIAGNOSIS — Z0000 Encounter for general adult medical examination without abnormal findings: Secondary | ICD-10-CM | POA: Diagnosis not present

## 2019-08-23 LAB — LIPID PANEL
Cholesterol: 236 mg/dL — ABNORMAL HIGH (ref 0–200)
HDL: 51.1 mg/dL (ref >39.00)
LDL Cholesterol: 152 mg/dL — ABNORMAL HIGH (ref 0–99)
NonHDL: 184.81
Total CHOL/HDL Ratio: 5
Triglycerides: 164 mg/dL — ABNORMAL HIGH (ref 0.0–149.0)
VLDL: 32.8 mg/dL (ref 0.0–40.0)

## 2019-08-23 LAB — CBC WITH DIFFERENTIAL/PLATELET
Basophils Absolute: 0.1 10*3/uL (ref 0.0–0.1)
Basophils Relative: 1.2 % (ref 0.0–3.0)
Eosinophils Absolute: 0.1 10*3/uL (ref 0.0–0.7)
Eosinophils Relative: 2.3 % (ref 0.0–5.0)
HCT: 48.4 % (ref 39.0–52.0)
Hemoglobin: 16.4 g/dL (ref 13.0–17.0)
Lymphocytes Relative: 32.1 % (ref 12.0–46.0)
Lymphs Abs: 1.7 10*3/uL (ref 0.7–4.0)
MCHC: 33.9 g/dL (ref 30.0–36.0)
MCV: 88.5 fl (ref 78.0–100.0)
Monocytes Absolute: 0.4 10*3/uL (ref 0.1–1.0)
Monocytes Relative: 7.5 % (ref 3.0–12.0)
Neutro Abs: 3 10*3/uL (ref 1.4–7.7)
Neutrophils Relative %: 56.9 % (ref 43.0–77.0)
Platelets: 171 10*3/uL (ref 150.0–400.0)
RBC: 5.47 Mil/uL (ref 4.22–5.81)
RDW: 14.2 % (ref 11.5–15.5)
WBC: 5.3 10*3/uL (ref 4.0–10.5)

## 2019-08-23 LAB — COMPREHENSIVE METABOLIC PANEL
ALT: 31 U/L (ref 0–53)
AST: 19 U/L (ref 0–37)
Albumin: 4.5 g/dL (ref 3.5–5.2)
Alkaline Phosphatase: 112 U/L (ref 39–117)
BUN: 17 mg/dL (ref 6–23)
CO2: 28 mEq/L (ref 19–32)
Calcium: 9.8 mg/dL (ref 8.4–10.5)
Chloride: 105 mEq/L (ref 96–112)
Creatinine, Ser: 1.01 mg/dL (ref 0.40–1.50)
GFR: 72.41 mL/min (ref >60.00)
Glucose, Bld: 135 mg/dL — ABNORMAL HIGH (ref 70–99)
Potassium: 4.7 mEq/L (ref 3.5–5.1)
Sodium: 142 mEq/L (ref 135–145)
Total Bilirubin: 0.5 mg/dL (ref 0.2–1.2)
Total Protein: 7 g/dL (ref 6.0–8.3)

## 2019-08-23 LAB — PSA: PSA: 7.77 ng/mL — ABNORMAL HIGH (ref 0.10–4.00)

## 2019-08-23 LAB — HEMOGLOBIN A1C: Hgb A1c MFr Bld: 6.2 % (ref 4.6–6.5)

## 2019-08-23 MED ORDER — ZOSTER VAC RECOMB ADJUVANTED 50 MCG/0.5ML IM SUSR: 0.5000 mL | Freq: Once | INTRAMUSCULAR | 0 refills | Status: AC

## 2019-08-23 MED ORDER — METHYLPREDNISOLONE ACETATE 40 MG/ML IJ SUSP
40.0000 mg | Freq: Once | INTRAMUSCULAR | Status: AC
  Administered 2019-08-23: 40 mg via INTRAMUSCULAR

## 2019-08-23 NOTE — Patient Instructions (Signed)

## 2019-08-23 NOTE — Progress Notes (Signed)
Office Note 08/23/2019  CC:  Chief Complaint  Patient presents with  . Annual Exam    fasting. wants flu shot.     HPI:  Wayne Lamb is a 73 y.o. White male who is here for annual health maintenance exam and f/u recently uncontrolled HTN.  He had been asymptomatic with quite high bp's. One month ago I started him on lisinopril 20mg  qd->his first bp med despite having had HTN for years (he is not a "medicine person"). He feels great.  He dose no exercise: plays golf once a week but uses a cart.  Interim hx: 146-166 over 82-88.  NOt taking lisinopril. For about a week he had back pain and bp was up to 160s at that time. Has DM but he has long been skeptical about this dx. Feet: no sx's.  Reports many years hx of bilat knee pains, anterior location, w/out preceding injury, w/out swellin/popping/locking/redness/or warmth.  Sometimes hears a crack/pop but never had pain bad enough to fall or be unable to ambulate.  He has OSA and is on CPAP but has not followed up with pulm in years per his report. He asks for new referral today so he can get re-evaluation and hopefully get new machine and supplies.   Past Medical History:  Diagnosis Date  . DM (diabetes mellitus), type 2 ([place]) 03/2016   HbA1c 6.2% on 01/18/17.  Initially dx'd by fasting glucose criteria.  . Elevated PSA    MRI prostate was done 2016 to eval persistently rising PSAs (no biopsy had been done): this showed no sign of prostate ca.  Stable 07/2017, 01/2018, and 01/2019.  Marland Kitchen Encounter for hepatitis C virus screening test for high risk patient    Done by [facility] physicians 01/18/17  . Essential hypertension    pt has declined to take med in past (as of 12/2016)  . GERD (gastroesophageal reflux disease)   . Hypercholesterolemia   . Lumbar spondylosis    MRI L spine 2005: severe facet deg, SI deg  . Obesity, Class II, BMI 35-39.9   . OSA on CPAP   . Prostate nodule 2016   prostate nodule or ridge in R prostate lobe--w/u  reassuring 2016   . Rhinitis   . Tubular adenoma of colon 2014   recommend repeat colonoscopy 2019    Past Surgical History:  Procedure Laterality Date  . INGUINAL HERNIA REPAIR Left   . right clavicle fracture     pin inserted (Dr. Eddie Dibbles)  . TONSILLECTOMY      Family History  Problem Relation Age of Onset  . Alcohol abuse Father   . Early death Father 6       unknown cause of death  . Heart disease Mother   . Heart disease Sister   . Learning disabilities Brother        Dyslexia    Social History   Socioeconomic History  . Marital status: Married    Spouse name: Not on file  . Number of children: Not on file  . Years of education: Not on file  . Highest education level: Not on file  Occupational History  . Not on file  Social Needs  . Financial resource strain: Not on file  . Food insecurity    Worry: Not on file    Inability: Not on file  . Transportation needs    Medical: Not on file    Non-medical: Not on file  Tobacco Use  . Smoking status: Former Smoker  Packs/day: 1.00    Years: 15.00    Pack years: 15.00    Types: Cigarettes    Quit date: 11/29/1979    Years since quitting: 39.7  . Smokeless tobacco: Never Used  Substance and Sexual Activity  . Alcohol use: Yes    Comment: occ  . Drug use: No  . Sexual activity: Not on file  Lifestyle  . Physical activity    Days per week: Not on file    Minutes per session: Not on file  . Stress: Not on file  Relationships  . Social Herbalist on phone: Not on file    Gets together: Not on file    Attends religious service: Not on file    Active member of club or organization: Not on file    Attends meetings of clubs or organizations: Not on file    Relationship status: Not on file  . Intimate partner violence    Fear of current or ex partner: Not on file    Emotionally abused: Not on file    Physically abused: Not on file    Forced sexual activity: Not on file  Other Topics Concern  . Not  on file  Social History Narrative   Married, 1 son who is an Forensic psychologist in [place].   HE GREW UP IN [place]---WENT TO [employer]!   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   Tob: former smoker, 15 pack-yr hx, quit 1970.   Alc: no   Exercise: 2-3 days per week, CV and wt training.    Outpatient Medications Prior to Visit  Medication Sig Dispense Refill  . Multiple Vitamins-Minerals (MULTIVITAMIN WITH MINERALS) tablet Take 1 tablet by mouth daily.    Marland Kitchen lisinopril (ZESTRIL) 20 MG tablet TAKE 1 TABLET BY MOUTH EVERY DAY (Patient not taking: Reported on 08/23/2019) 30 tablet 0   No facility-administered medications prior to visit.     No Known Allergies  ROS Review of Systems  Constitutional: Negative for appetite change, chills, fatigue and fever.  HENT: Negative for congestion, dental problem, ear pain and sore throat.   Eyes: Negative for discharge, redness and visual disturbance.  Respiratory: Negative for cough, chest tightness, shortness of breath and wheezing.   Cardiovascular: Negative for chest pain, palpitations and leg swelling.  Gastrointestinal: Negative for abdominal pain, blood in stool, diarrhea, nausea and vomiting.  Genitourinary: Negative for difficulty urinating, dysuria, flank pain, frequency, hematuria and urgency.  Musculoskeletal: Positive for arthralgias (bilat knees\). Negative for back pain, joint swelling, myalgias and neck stiffness.  Skin: Negative for pallor and rash.  Neurological: Negative for dizziness, speech difficulty, weakness and headaches.  Hematological: Negative for adenopathy. Does not bruise/bleed easily.  Psychiatric/Behavioral: Negative for confusion and sleep disturbance. The patient is not nervous/anxious.      PE; Blood pressure (!) 148/77, pulse 63, temperature (!) 97.3 F (36.3 C), temperature source Temporal, resp. rate 16, height 5\' 8"  (1.727 m), weight 230 lb 4 oz (104.4 kg), SpO2 93 %. Gen: Alert, well  appearing.  Patient is oriented to person, place, time, and situation. AFFECT: pleasant, lucid thought and speech. ENT: Ears: EACs clear, normal epithelium.  TMs with good light reflex and landmarks bilaterally.  Eyes: no injection, icteris, swelling, or exudate.  EOMI, PERRLA. Nose: no drainage or turbinate edema/swelling.  No injection or focal lesion.  Mouth: lips without lesion/swelling.  Oral mucosa pink and moist.  Dentition intact and without obvious caries  or gingival swelling.  Oropharynx without erythema, exudate, or swelling.  Neck: supple/nontender.  No LAD, mass, or TM.  Carotid pulses 2+ bilaterally, without bruits. CV: RRR, no m/r/g.   LUNGS: CTA bilat, nonlabored resps, good aeration in all lung fields. ABD: soft, NT, ND, BS normal.  No hepatospenomegaly or mass.  No bruits. EXT: no clubbing, cyanosis, or edema.  Musculoskeletal: no joint swelling, erythema, warmth, or tenderness.  ROM of all joints intact. Some crepitus with patellar grind bilat.  Knees w/out swelling, erythema, warmth, or abnormal ROM.  NO instability. No tenderness anywhere. Skin - no sores or suspicious lesions or rashes or color changes Rectal: pt declined Pertinent labs:  Lab Results  Component Value Date   TSH 1.62 08/17/2018   Lab Results  Component Value Date   WBC 5.0 08/17/2018   HGB 16.9 08/17/2018   HCT 49.3 08/17/2018   MCV 88.3 08/17/2018   PLT 189.0 08/17/2018   Lab Results  Component Value Date   CREATININE 1.09 07/19/2019   BUN 18 07/19/2019   NA 139 07/19/2019   K 4.3 07/19/2019   CL 103 07/19/2019   CO2 23 07/19/2019   Lab Results  Component Value Date   ALT 14 02/15/2019   AST 13 02/15/2019   ALKPHOS 84 02/15/2019   BILITOT 1.3 (H) 02/15/2019   Lab Results  Component Value Date   CHOL 272 (H) 02/15/2019   Lab Results  Component Value Date   HDL 39.00 (L) 02/15/2019   Lab Results  Component Value Date   LDLCALC 208 (H) 02/15/2019   Lab Results  Component  Value Date   TRIG 125.0 02/15/2019   Lab Results  Component Value Date   CHOLHDL 7 02/15/2019   Lab Results  Component Value Date   PSA 5.59 (H) 02/15/2019   PSA 5.67 (H) 02/14/2018   PSA 5.51 (H) 08/16/2017   Lab Results  Component Value Date   HGBA1C 5.7 02/15/2019    ASSESSMENT AND PLAN:   1) HTN: not well controlled, but once again he declines to take any bp meds.  Has lack of trust of pharmaceuticals.  2) DM 2.  No home monitoring. HbA1c today.  Feet exam normal today.  3) OSA on CPAP: needs to get back into pattern of routine f/u for this condition, new supplies needed. Refer back to Dr. Annamaria Boots with [facility].  4) Bilat knee patellofemoral arthritis. Discussed strengthening/stretching.  Offered steroid injection in L knee today and he was happy to proceed with this.   Procedure: Therapeutic knee injection.  The patient's clinical condition is marked by substantial pain and/or significant functional disability.  Other conservative therapy has not provided relief, is contraindicated, or not appropriate.  There is a reasonable likelihood that injection will significantly improve the patient's pain and/or functional disability. Cleaned skin with alcohol swab, used anterior approach to enter knee joint, Injected 1 ml of 80mg /ml depo medrol +  2 ml of 1% plain lidocaine  into joint space without resistance.  No immediate complications.  Patient tolerated procedure well.  Post-injection care discussed, including 20 min of icing 1-2 times in the next 4-8 hours, frequent non weight-bearing ROM exercises over the next few days, and general pain medication management.  5) Hx of elevated PSA.  PSA monitoring today.  6) Health maintenance exam: Reviewed age and gender appropriate health maintenance issues (prudent diet, regular exercise, health risks of tobacco and excessive alcohol, use of seatbelts, fire alarms in home, use of sunscreen).  Also reviewed age and gender appropriate  health screening as well as vaccine recommendations. Vaccines: Flu-->he got this today.    Shingrix--> rx to pt's pharmacy. Labs: fasting HP, HbA1c, feet exam->normal today. Prostate ca screening: hx of prostate nodule and elevated PSA.  Last PSA 01/2019 stable.  Will recheck PSA today. He declines DRE today. If PSA trending up then will get him back to see  Colon ca screening:  He is overdue for repeat colonoscopy-->he declines to get repeat CC screening.  An After Visit Summary was printed and given to the patient.  FOLLOW UP:  6 mo RCI  Signed:  Crissie Sickles, MD           08/23/2019

## 2019-08-26 ENCOUNTER — Encounter: Payer: Self-pay | Admitting: Family Medicine

## 2019-09-04 ENCOUNTER — Telehealth: Payer: Self-pay | Admitting: Internal Medicine

## 2019-09-04 ENCOUNTER — Encounter: Payer: Self-pay | Admitting: Internal Medicine

## 2019-09-04 ENCOUNTER — Other Ambulatory Visit: Payer: Self-pay

## 2019-09-04 ENCOUNTER — Ambulatory Visit: Payer: PPO | Admitting: Internal Medicine

## 2019-09-04 VITALS — BP 142/86 | HR 65 | Temp 97.8°F | Ht 69.25 in | Wt 227.6 lb

## 2019-09-04 DIAGNOSIS — G4733 Obstructive sleep apnea (adult) (pediatric): Secondary | ICD-10-CM | POA: Diagnosis not present

## 2019-09-04 DIAGNOSIS — E669 Obesity, unspecified: Secondary | ICD-10-CM | POA: Diagnosis not present

## 2019-09-04 NOTE — Telephone Encounter (Signed)
Call returned to patient, confirmed DOB, he reports he was tested for covid back in July and he never got the results. He denies all symptoms. Denies fever, N/V/D, denies lost of taste or smell. Denies all covid screening questions. I made him aware I would speak with CY and get back with him. Voiced understanding.

## 2019-09-04 NOTE — Patient Instructions (Signed)
Order- DME [facility]  Please replace old CPAP machine if eligible auto 5-20, mask of choice, humidifier, supplies, AirView/ card  If condensation in the hose remains a problem, you can try lowering the machine physically, perhaps on a stool, so water drains from the hose back into the reservoir.  Please call if we can help

## 2019-09-04 NOTE — Assessment & Plan Note (Signed)
He will be better off with effective weight loss- encouraged to try.

## 2019-09-04 NOTE — Progress Notes (Signed)
HPI male former smoker followed for OSA, rhinitis, complicated by obesity, DM2, HBP, GERD, Hypercholesterolemia,  NPSG 10/16/03- AHI (RDI) 51/ hr, desat to 83%, body weight 220 lbs  ----------------------------------------------------------------------  06/10/2016-73 year old male former smoker followed for OSA, rhinitis NPSG 10/16/03- AHI (RDI) 51/ hr, desat to 83%, body weight 220 lbs CPAP auto 5-20/[facility] FOLLOW FOR: doing well on CPAP. Would like to discuss new mask.   He has not found a mask that suits him because he turns a lot and sleep apnea may all dislodged. Pressures are comfortable. We discussed oral appliances as an alternative. Weight loss remains at goal.  09/04/2019- 73 year old male former smoker followed for OSA, rhinitis, complicated by obesity, DM2, HBP, GERD, Hypercholesterolemia,  CPAP auto 5-20/[facility] -----OSA on CPAP, DME: [facility], pt states he could not receive supplies d/t needing to see provider Body weight today 227 lbs Epworth score 5 Download compliance 80%, AHI 3.7/ hr Has had flu vax Now using second machine; travels with original. Water has begun condensing in hose just recently- turned down humidifier. Got SoClean. Machine may be eligible for replacement.  Discussed Covid precautions, pending vaccine.  Sleeps better w CPAP.  ROS-see HPI Constitutional:   weight loss, night sweats, fevers, chills, fatigue, lassitude. HEENT:   No-  headaches, difficulty swallowing, tooth/dental problems, sore throat,       No-  sneezing, itching, ear ache, nasal congestion, post nasal drip,  CV:  No-   chest pain, orthopnea, PND, swelling in lower extremities, anasarca, dizziness, palpitations Resp: No-   shortness of breath with exertion or at rest.              No-   productive cough,  No non-productive cough,  No- coughing up of blood.              No-   change in color of mucus.  No- wheezing.   Skin: No-   rash or lesions. GI:  No-   heartburn, indigestion,  abdominal pain, nausea, vomiting, GU:  MS:  No-   joint pain or swelling.  Neuro-     nothing unusual Psych:  No- change in mood or affect. No depression or anxiety.  No memory loss.  OBJ- Physical Exam General- Alert, Oriented, Affect-appropriate, Distress- none acute, looks well, +obese Skin- rash-none, lesions- none, excoriation- none Lymphadenopathy- none Head- atraumatic            Eyes- Gross vision intact, PERRLA, conjunctivae and secretions clear            Ears- Hearing, canals-normal            Nose- Clear, no-Septal dev, mucus, polyps, erosion, perforation             Throat- Mallampati II-III , mucosa clear , drainage- none, tonsils- atrophic Neck- flexible , trachea midline, no stridor , thyroid nl, carotid no bruit Chest - symmetrical excursion , unlabored           Heart/CV- RRR , no murmur , no gallop  , no rub, nl s1 s2                           - JVD- none , edema- none, stasis changes- none, varices- none           Lung- clear to P&A, wheeze- none, cough- none , dullness-none, rub- none           Chest wall-  Abd- Br/ Gen/ Rectal- Not done,  not indicated Extrem- cyanosis- none, clubbing, none, atrophy- none, strength- nl Neuro- grossly intact to observation

## 2019-09-04 NOTE — Assessment & Plan Note (Signed)
Benefits from CPAP with improved sleep. Machine getting old. Discussed adjusting humidifier, height relative to head of bed. Plan- replace old machine if eligible, auto 5-20

## 2019-09-04 NOTE — Telephone Encounter (Signed)
Verbal given from CY okay for patient to come in for OV.   Call made to patient to make aware. Nothing further needed at this time.

## 2019-09-18 DIAGNOSIS — G4733 Obstructive sleep apnea (adult) (pediatric): Secondary | ICD-10-CM | POA: Diagnosis not present

## 2019-09-30 DIAGNOSIS — N401 Enlarged prostate with lower urinary tract symptoms: Secondary | ICD-10-CM | POA: Diagnosis not present

## 2019-09-30 DIAGNOSIS — R972 Elevated prostate specific antigen [PSA]: Secondary | ICD-10-CM | POA: Diagnosis not present

## 2019-09-30 DIAGNOSIS — R350 Frequency of micturition: Secondary | ICD-10-CM | POA: Diagnosis not present

## 2019-10-08 ENCOUNTER — Other Ambulatory Visit: Payer: Self-pay | Admitting: Urology

## 2019-10-08 DIAGNOSIS — R972 Elevated prostate specific antigen [PSA]: Secondary | ICD-10-CM

## 2019-10-19 DIAGNOSIS — G4733 Obstructive sleep apnea (adult) (pediatric): Secondary | ICD-10-CM | POA: Diagnosis not present

## 2019-10-23 ENCOUNTER — Other Ambulatory Visit: Payer: Self-pay

## 2019-10-23 DIAGNOSIS — Z20822 Contact with and (suspected) exposure to covid-19: Secondary | ICD-10-CM

## 2019-10-25 LAB — NOVEL CORONAVIRUS, NAA: SARS-CoV-2, NAA: NOT DETECTED

## 2019-11-04 ENCOUNTER — Other Ambulatory Visit: Payer: Self-pay

## 2019-11-04 ENCOUNTER — Ambulatory Visit
Admission: RE | Admit: 2019-11-04 | Discharge: 2019-11-04 | Disposition: A | Discharge status: Home / Self Care | Payer: PPO | Source: Ambulatory Visit | Attending: Urology | Admitting: Urology

## 2019-11-04 DIAGNOSIS — N4289 Other specified disorders of prostate: Secondary | ICD-10-CM | POA: Diagnosis not present

## 2019-11-04 DIAGNOSIS — R972 Elevated prostate specific antigen [PSA]: Secondary | ICD-10-CM

## 2019-11-04 MED ORDER — GADOBENATE DIMEGLUMINE 529 MG/ML IV SOLN
20.0000 mL | Freq: Once | INTRAVENOUS | Status: AC | PRN
  Administered 2019-11-04: 20 mL via INTRAVENOUS

## 2019-11-18 DIAGNOSIS — G4733 Obstructive sleep apnea (adult) (pediatric): Secondary | ICD-10-CM | POA: Diagnosis not present

## 2019-12-26 DIAGNOSIS — R972 Elevated prostate specific antigen [PSA]: Secondary | ICD-10-CM | POA: Diagnosis not present

## 2019-12-27 DIAGNOSIS — G4733 Obstructive sleep apnea (adult) (pediatric): Secondary | ICD-10-CM | POA: Diagnosis not present

## 2019-12-27 LAB — PSA: PSA: 6.68

## 2020-01-01 DIAGNOSIS — N401 Enlarged prostate with lower urinary tract symptoms: Secondary | ICD-10-CM | POA: Diagnosis not present

## 2020-01-01 DIAGNOSIS — R350 Frequency of micturition: Secondary | ICD-10-CM | POA: Diagnosis not present

## 2020-01-01 DIAGNOSIS — R972 Elevated prostate specific antigen [PSA]: Secondary | ICD-10-CM | POA: Diagnosis not present

## 2020-01-08 ENCOUNTER — Encounter: Payer: Self-pay | Admitting: Family Medicine

## 2020-01-09 DIAGNOSIS — G4733 Obstructive sleep apnea (adult) (pediatric): Secondary | ICD-10-CM | POA: Diagnosis not present

## 2020-01-22 ENCOUNTER — Encounter: Payer: Self-pay | Admitting: Family Medicine

## 2020-01-26 DIAGNOSIS — G4733 Obstructive sleep apnea (adult) (pediatric): Secondary | ICD-10-CM | POA: Diagnosis not present

## 2020-02-23 DIAGNOSIS — G4733 Obstructive sleep apnea (adult) (pediatric): Secondary | ICD-10-CM | POA: Diagnosis not present

## 2020-03-25 DIAGNOSIS — G4733 Obstructive sleep apnea (adult) (pediatric): Secondary | ICD-10-CM | POA: Diagnosis not present

## 2020-04-14 IMAGING — MR MR PROSTATE WO/W CM
56 series · 56 of 56 positions shown · IV contrast (20ml Multihance)
Comparison: 07/27/2015

CLINICAL DATA: Elevated PSA level of 6.07 10/01/2019.

EXAM:
MR PROSTATE WITHOUT AND WITH CONTRAST
TECHNIQUE: Multiplanar multisequence MRI images were obtained of the pelvis
centered about the prostate. Pre and post contrast images were
obtained.
CONTRAST:  20mL MULTIHANCE GADOBENATE DIMEGLUMINE 529 MG/ML IV SOLN

[Series 3: new loc · axial · 6.0mm · 0.88mm/px · 1 of 17 slices shown]
[im 1/17]
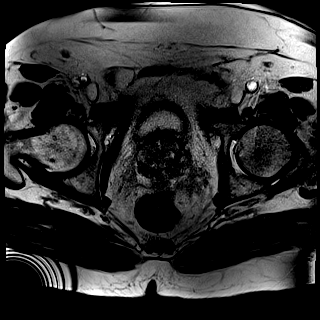

[Series 4: T1 · axial · 8.0mm · 1.06mm/px · 1 of 24 slices shown (1 of 2)]
[im 1/24]
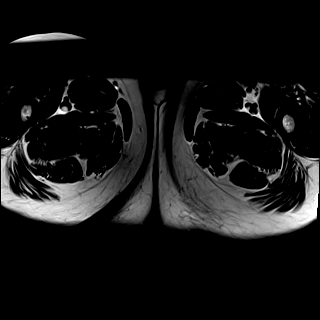

[Series 5: bSSFP fat-sat · axial · 8.0mm · 0.74mm/px · 1 of 24 slices shown]
[im 1/24]
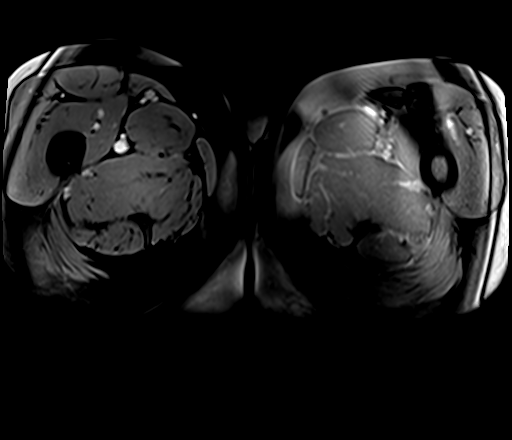

[Series 6: T2 · sagittal · 3.5mm · 0.56mm/px · 1 of 39 slices shown (1 of 4)]
[im 1/39]
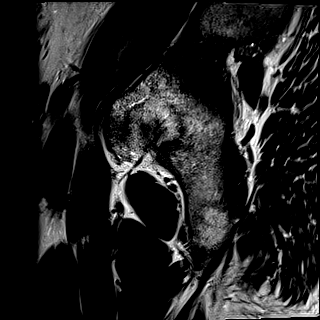

[Series 7: T2 · coronal · 3.5mm · 0.56mm/px · 1 of 26 slices shown (2 of 4)]
[im 1/26]
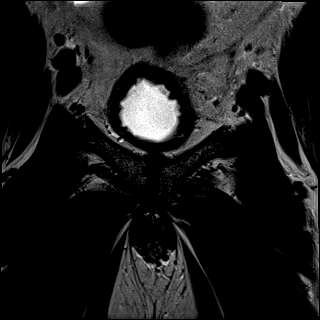

[Series 8: DWI · axial · 3.5mm · 1.56mm/px · 1 of 58 slices shown (1 of 2)]
[im 1/58]
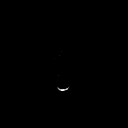

[Series 9: DWI · axial · 3.5mm · 1.56mm/px · 1 of 20 slices shown (2 of 2)]
[im 1/20]
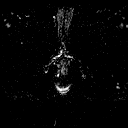

[Series 10: T1 · axial · 3.0mm · 0.31mm/px · 1 of 24 slices shown (2 of 2)]
[im 1/24]
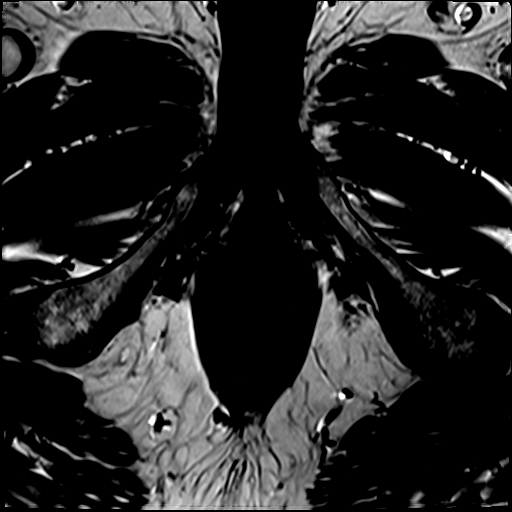

[Series 11: T2 · axial · 3.5mm · 0.56mm/px · 1 of 23 slices shown (3 of 4)]
[im 1/23]
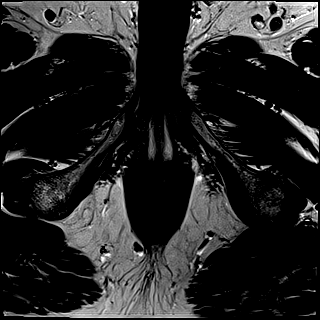

[Series 12: T2 · axial · 1.0mm · 1.04mm/px · 1 of 80 slices shown (4 of 4)]
[im 1/80]
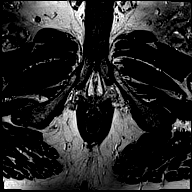

[Series 13: pre t1_twist_tra_dyn_ttc=5.3s · axial · non-contrast · 3.5mm · 0.83mm/px · 1 of 20 slices shown]
[im 1/20]
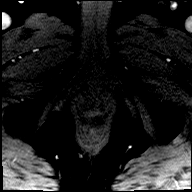

[Series 14: post t1_twist_tra_dyn-copy center · axial · 3.5mm · 0.83mm/px · 1 of 20 slices shown (1 of 23)]
[im 1/20]
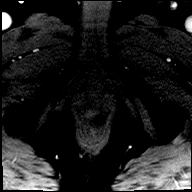

[Series 15: post t1_twist_tra_dyn-copy center · axial · 3.5mm · 0.83mm/px · 1 of 20 slices shown (2 of 23)]
[im 1/20]
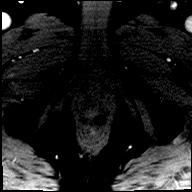

[Series 16: post t1_twist_tra_dyn-copy cent_sub_ttc=(id) · axial · 3.5mm · 0.83mm/px · 1 of 20 slices shown (1 of 22)]
[im 1/20]
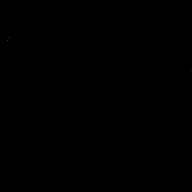

[Series 17: post t1_twist_tra_dyn-copy center · axial · 3.5mm · 0.83mm/px · 1 of 20 slices shown (3 of 23)]
[im 1/20]
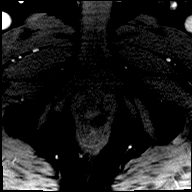

[Series 18: post t1_twist_tra_dyn-copy cent_sub_ttc=(id) · axial · 3.5mm · 0.83mm/px · 1 of 20 slices shown (2 of 22)]
[im 1/20]
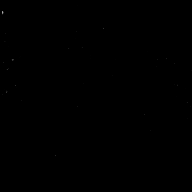

[Series 19: post t1_twist_tra_dyn-copy center · axial · 3.5mm · 0.83mm/px · 1 of 20 slices shown (4 of 23)]
[im 1/20]
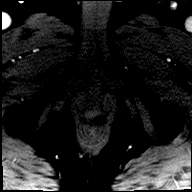

[Series 20: post t1_twist_tra_dyn-copy cent_sub_ttc=(id) · axial · 3.5mm · 0.83mm/px · 1 of 20 slices shown (3 of 22)]
[im 1/20]
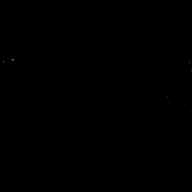

[Series 21: post t1_twist_tra_dyn-copy center · axial · 3.5mm · 0.83mm/px · 1 of 20 slices shown (5 of 23)]
[im 1/20]
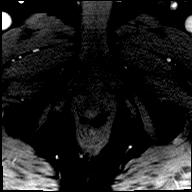

[Series 22: post t1_twist_tra_dyn-copy cent_sub_ttc=(id) · axial · 3.5mm · 0.83mm/px · 1 of 20 slices shown (4 of 22)]
[im 1/20]
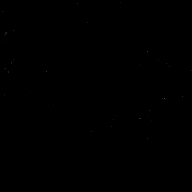

[Series 23: post t1_twist_tra_dyn-copy center · axial · 3.5mm · 0.83mm/px · 1 of 20 slices shown (6 of 23)]
[im 1/20]
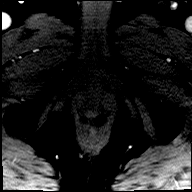

[Series 24: post t1_twist_tra_dyn-copy cent_sub_ttc=(id) · axial · 3.5mm · 0.83mm/px · 1 of 20 slices shown (5 of 22)]
[im 1/20]
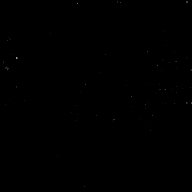

[Series 25: post t1_twist_tra_dyn-copy center · axial · 3.5mm · 0.83mm/px · 1 of 20 slices shown (7 of 23)]
[im 1/20]
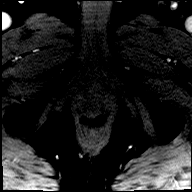

[Series 26: post t1_twist_tra_dyn-copy cent_sub_ttc=(id) · axial · 3.5mm · 0.83mm/px · 1 of 20 slices shown (6 of 22)]
[im 1/20]
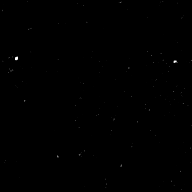

[Series 27: post t1_twist_tra_dyn-copy center · axial · 3.5mm · 0.83mm/px · 1 of 20 slices shown (8 of 23)]
[im 1/20]
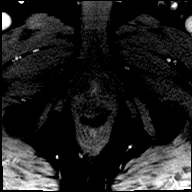

[Series 28: post t1_twist_tra_dyn-copy cent_sub_ttc=(id) · axial · 3.5mm · 0.83mm/px · 1 of 20 slices shown (7 of 22)]
[im 1/20]
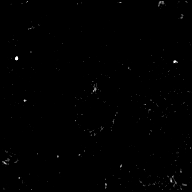

[Series 29: post t1_twist_tra_dyn-copy center · axial · 3.5mm · 0.83mm/px · 1 of 20 slices shown (9 of 23)]
[im 1/20]
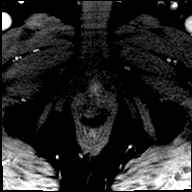

[Series 30: post t1_twist_tra_dyn-copy cent_sub_ttc=(id) · axial · 3.5mm · 0.83mm/px · 1 of 20 slices shown (8 of 22)]
[im 1/20]
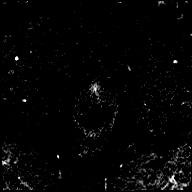

[Series 31: post t1_twist_tra_dyn-copy center · axial · 3.5mm · 0.83mm/px · 1 of 20 slices shown (10 of 23)]
[im 1/20]
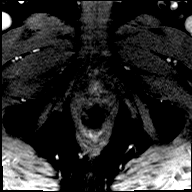

[Series 32: post t1_twist_tra_dyn-copy cent_sub_ttc=(id) · axial · 3.5mm · 0.83mm/px · 1 of 20 slices shown (9 of 22)]
[im 1/20]
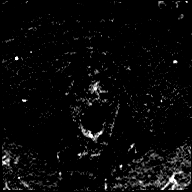

[Series 33: post t1_twist_tra_dyn-copy center · axial · 3.5mm · 0.83mm/px · 1 of 20 slices shown (11 of 23)]
[im 1/20]
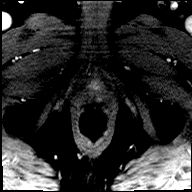

[Series 34: post t1_twist_tra_dyn-copy cent_sub_ttc=(id) · axial · 3.5mm · 0.83mm/px · 1 of 20 slices shown (10 of 22)]
[im 1/20]
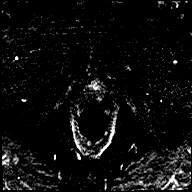

[Series 35: post t1_twist_tra_dyn-copy center · axial · 3.5mm · 0.83mm/px · 1 of 20 slices shown (12 of 23)]
[im 1/20]
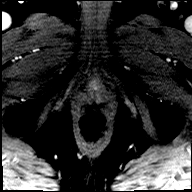

[Series 36: post t1_twist_tra_dyn-copy cent_sub_ttc=(id) · axial · 3.5mm · 0.83mm/px · 1 of 20 slices shown (11 of 22)]
[im 1/20]
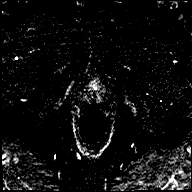

[Series 37: post t1_twist_tra_dyn-copy center · axial · 3.5mm · 0.83mm/px · 1 of 20 slices shown (13 of 23)]
[im 1/20]
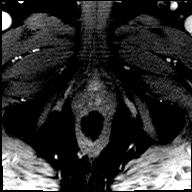

[Series 38: post t1_twist_tra_dyn-copy cent_sub_ttc=(id) · axial · 3.5mm · 0.83mm/px · 1 of 20 slices shown (12 of 22)]
[im 1/20]
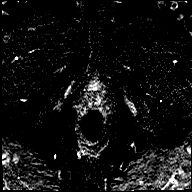

[Series 39: post t1_twist_tra_dyn-copy center · axial · 3.5mm · 0.83mm/px · 1 of 20 slices shown (14 of 23)]
[im 1/20]
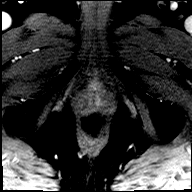

[Series 40: post t1_twist_tra_dyn-copy cent_sub_ttc=(id) · axial · 3.5mm · 0.83mm/px · 1 of 20 slices shown (13 of 22)]
[im 1/20]
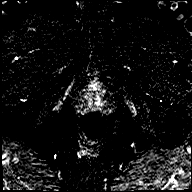

[Series 41: post t1_twist_tra_dyn-copy center · axial · 3.5mm · 0.83mm/px · 1 of 20 slices shown (15 of 23)]
[im 1/20]
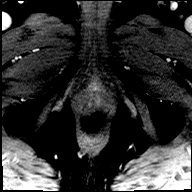

[Series 42: post t1_twist_tra_dyn-copy cent_sub_ttc=(id) · axial · 3.5mm · 0.83mm/px · 1 of 20 slices shown (14 of 22)]
[im 1/20]
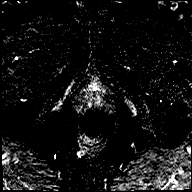

[Series 43: post t1_twist_tra_dyn-copy center · axial · 3.5mm · 0.83mm/px · 1 of 20 slices shown (16 of 23)]
[im 1/20]
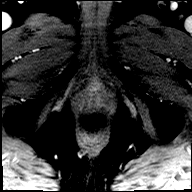

[Series 44: post t1_twist_tra_dyn-copy cent_sub_ttc=(id) · axial · 3.5mm · 0.83mm/px · 1 of 20 slices shown (15 of 22)]
[im 1/20]
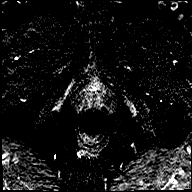

[Series 45: post t1_twist_tra_dyn-copy center · axial · 3.5mm · 0.83mm/px · 1 of 20 slices shown (17 of 23)]
[im 1/20]
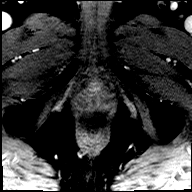

[Series 46: post t1_twist_tra_dyn-copy cent_sub_ttc=(id) · axial · 3.5mm · 0.83mm/px · 1 of 20 slices shown (16 of 22)]
[im 1/20]
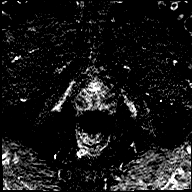

[Series 47: post t1_twist_tra_dyn-copy center · axial · 3.5mm · 0.83mm/px · 1 of 20 slices shown (18 of 23)]
[im 1/20]
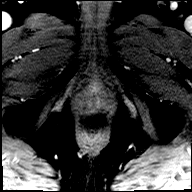

[Series 48: post t1_twist_tra_dyn-copy cent_sub_ttc=(id) · axial · 3.5mm · 0.83mm/px · 1 of 20 slices shown (17 of 22)]
[im 1/20]
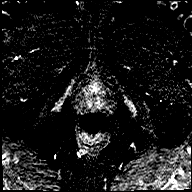

[Series 49: post t1_twist_tra_dyn-copy center · axial · 3.5mm · 0.83mm/px · 1 of 20 slices shown (19 of 23)]
[im 1/20]
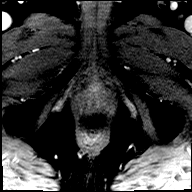

[Series 50: post t1_twist_tra_dyn-copy cent_sub_ttc=(id) · axial · 3.5mm · 0.83mm/px · 1 of 20 slices shown (18 of 22)]
[im 1/20]
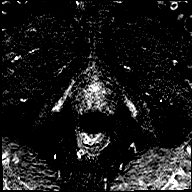

[Series 51: post t1_twist_tra_dyn-copy center · axial · 3.5mm · 0.83mm/px · 1 of 20 slices shown (20 of 23)]
[im 1/20]
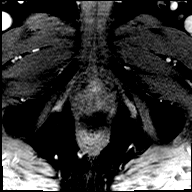

[Series 52: post t1_twist_tra_dyn-copy cent_sub_ttc=(id) · axial · 3.5mm · 0.83mm/px · 1 of 20 slices shown (19 of 22)]
[im 1/20]
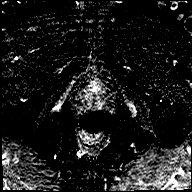

[Series 53: post t1_twist_tra_dyn-copy center · axial · 3.5mm · 0.83mm/px · 1 of 20 slices shown (21 of 23)]
[im 1/20]
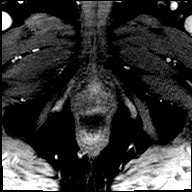

[Series 54: post t1_twist_tra_dyn-copy cent_sub_ttc=(id) · axial · 3.5mm · 0.83mm/px · 1 of 20 slices shown (20 of 22)]
[im 1/20]
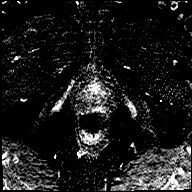

[Series 55: post t1_twist_tra_dyn-copy center · axial · 3.5mm · 0.83mm/px · 1 of 20 slices shown (22 of 23)]
[im 1/20]
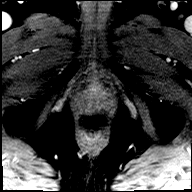

[Series 56: post t1_twist_tra_dyn-copy cent_sub_ttc=(id) · axial · 3.5mm · 0.83mm/px · 1 of 20 slices shown (21 of 22)]
[im 1/20]
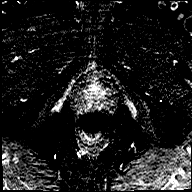

[Series 57: post t1_twist_tra_dyn-copy center · axial · 3.5mm · 0.83mm/px · 1 of 20 slices shown (23 of 23)]
[im 1/20]
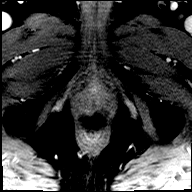

[Series 58: post t1_twist_tra_dyn-copy cent_sub_ttc=(id) · axial · 3.5mm · 0.83mm/px · 1 of 20 slices shown (22 of 22)]
[im 1/20]
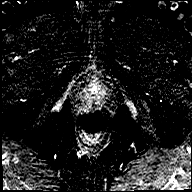

[56 of 56 positions shown; findings below may reference images not displayed]

FINDINGS: Prostate:

Region of interest # 1: PI-RADS category 3 lesion in the right
anterior and right posterolateral peripheral zone mid gland, with
bandlike reduced T2 and ADC map activity but no early enhancement.
This lesion has a volume of 0.27 cubic cm (1.2 by 0.4 by 0.7 cm).

Region of interest # 2: PI-RADS category 2 bandlike T2 hypointensity
in the left posteromedial peripheral zone in the mid gland, no
significant early enhancement. This lesion measures 0.23 cubic cm
(1.1 by 0.5 by 0.6 cm).

There some linear and sheet like reduced T2 signal in both the right
and left peripheral zone which is PI-RADS category 2.

Volume: 3D volumetric analysis: Prostate volume 80.45 cubic cm (6.1
by 5.3 by 5.6 cm).

Transcapsular spread:  Absent

Seminal vesicle involvement: Absent

Neurovascular bundle involvement: Absent

Pelvic adenopathy: Absent

Bone metastasis: Absent

Other findings: Sigmoid colon diverticulosis.
IMPRESSION: 1. PI-RADS category 3 lesion in the right anterior and right
posterolateral peripheral zone in the mid gland.
2. PI-RADS category 2 bandlike T2 hypointensity in the left
posteromedial peripheral zone in the mid gland.
3. Targeting data for both of these regions of interest sent to
[facility].
4. Sigmoid colon diverticulosis.

## 2020-04-24 DIAGNOSIS — G4733 Obstructive sleep apnea (adult) (pediatric): Secondary | ICD-10-CM | POA: Diagnosis not present

## 2020-05-25 DIAGNOSIS — G4733 Obstructive sleep apnea (adult) (pediatric): Secondary | ICD-10-CM | POA: Diagnosis not present

## 2020-05-26 DIAGNOSIS — G4733 Obstructive sleep apnea (adult) (pediatric): Secondary | ICD-10-CM | POA: Diagnosis not present

## 2020-06-24 DIAGNOSIS — G4733 Obstructive sleep apnea (adult) (pediatric): Secondary | ICD-10-CM | POA: Diagnosis not present

## 2020-06-26 LAB — PSA: PSA: 7.37

## 2020-06-27 DIAGNOSIS — R972 Elevated prostate specific antigen [PSA]: Secondary | ICD-10-CM | POA: Diagnosis not present

## 2020-07-03 DIAGNOSIS — R3915 Urgency of urination: Secondary | ICD-10-CM | POA: Diagnosis not present

## 2020-07-03 DIAGNOSIS — N401 Enlarged prostate with lower urinary tract symptoms: Secondary | ICD-10-CM | POA: Diagnosis not present

## 2020-07-03 DIAGNOSIS — R972 Elevated prostate specific antigen [PSA]: Secondary | ICD-10-CM | POA: Diagnosis not present

## 2020-07-09 DIAGNOSIS — H2513 Age-related nuclear cataract, bilateral: Secondary | ICD-10-CM | POA: Diagnosis not present

## 2020-07-09 DIAGNOSIS — H10413 Chronic giant papillary conjunctivitis, bilateral: Secondary | ICD-10-CM | POA: Diagnosis not present

## 2020-07-09 DIAGNOSIS — H02834 Dermatochalasis of left upper eyelid: Secondary | ICD-10-CM | POA: Diagnosis not present

## 2020-07-09 DIAGNOSIS — H02831 Dermatochalasis of right upper eyelid: Secondary | ICD-10-CM | POA: Diagnosis not present

## 2020-07-09 DIAGNOSIS — H04123 Dry eye syndrome of bilateral lacrimal glands: Secondary | ICD-10-CM | POA: Diagnosis not present

## 2020-07-09 DIAGNOSIS — H43812 Vitreous degeneration, left eye: Secondary | ICD-10-CM | POA: Diagnosis not present

## 2020-07-21 ENCOUNTER — Encounter: Payer: Self-pay | Admitting: Family Medicine

## 2020-07-25 DIAGNOSIS — G4733 Obstructive sleep apnea (adult) (pediatric): Secondary | ICD-10-CM | POA: Diagnosis not present

## 2020-08-25 DIAGNOSIS — G4733 Obstructive sleep apnea (adult) (pediatric): Secondary | ICD-10-CM | POA: Diagnosis not present

## 2020-08-31 DIAGNOSIS — G4733 Obstructive sleep apnea (adult) (pediatric): Secondary | ICD-10-CM | POA: Diagnosis not present

## 2020-09-09 ENCOUNTER — Ambulatory Visit (INDEPENDENT_AMBULATORY_CARE_PROVIDER_SITE_OTHER): Payer: PPO | Admitting: Internal Medicine

## 2020-09-09 ENCOUNTER — Encounter: Payer: Self-pay | Admitting: Internal Medicine

## 2020-09-09 ENCOUNTER — Other Ambulatory Visit: Payer: Self-pay

## 2020-09-09 DIAGNOSIS — E669 Obesity, unspecified: Secondary | ICD-10-CM | POA: Diagnosis not present

## 2020-09-09 DIAGNOSIS — G4733 Obstructive sleep apnea (adult) (pediatric): Secondary | ICD-10-CM

## 2020-09-09 NOTE — Progress Notes (Signed)
HPI male former smoker followed for OSA, rhinitis, complicated by obesity, DM2, HBP, GERD, Hypercholesterolemia,  NPSG 10/16/03- AHI (RDI) 51/ hr, desat to 83%, body weight 220 lbs  ----------------------------------------------------------------------   09/04/2019- 74 year old male former smoker followed for OSA, rhinitis, complicated by obesity, DM2, HBP, GERD, Hypercholesterolemia,  CPAP auto 5-20/[employer] -----OSA on CPAP, DME: [employer], pt states he could not receive supplies d/t needing to see provider Body weight today 227 lbs Epworth score 5 Download compliance 80%, AHI 3.7/ hr Has had flu vax Now using second machine; travels with original. Water has begun condensing in hose just recently- turned down humidifier. Got SoClean. Machine may be eligible for replacement.  Discussed Covid precautions, pending vaccine.  Sleeps better w CPAP.  09/09/20-  74 year old male former smoker followed for OSA, rhinitis, complicated by obesity, DM2, HBP, GERD, Hypercholesterolemia,  CPAP auto 5-20/[employer] Download- compliance 93%, AHI 2.3/ hr Body weight today-234 lbs Covid vax- none Flu vax- will get from PCP "I don't get Covid vaccine for the same reason I don't use prescription medicines." He reports being comfortable with CPAP . Discussed compliance and control.  ROS-see HPI   + = positive Constitutional:   weight loss, night sweats, fevers, chills, fatigue, lassitude. HEENT:   No-  headaches, difficulty swallowing, tooth/dental problems, sore throat,       No-  sneezing, itching, ear ache, nasal congestion, post nasal drip,  CV:  No-   chest pain, orthopnea, PND, swelling in lower extremities, anasarca, dizziness, palpitations Resp: No-   shortness of breath with exertion or at rest.              No-   productive cough,  No non-productive cough,  No- coughing up of blood.              No-   change in color of mucus.  No- wheezing.   Skin: No-   rash or lesions. GI:  No-   heartburn,  indigestion, abdominal pain, nausea, vomiting, GU:  MS:  No-   joint pain or swelling.  Neuro-     nothing unusual Psych:  No- change in mood or affect. No depression or anxiety.  No memory loss.  OBJ- Physical Exam General- Alert, Oriented, Affect-appropriate, Distress- none acute, looks well, +obese Skin- rash-none, lesions- none, excoriation- none Lymphadenopathy- none Head- atraumatic            Eyes- Gross vision intact, PERRLA, conjunctivae and secretions clear            Ears- Hearing, canals-normal            Nose- Clear, no-Septal dev, mucus, polyps, erosion, perforation             Throat- Mallampati II-III , mucosa clear , drainage- none, tonsils- atrophic Neck- flexible , trachea midline, no stridor , thyroid nl, carotid no bruit Chest - symmetrical excursion , unlabored           Heart/CV- RRR , no murmur , no gallop  , no rub, nl s1 s2                           - JVD- none , edema- none, stasis changes- none, varices- none           Lung- clear to P&A, wheeze- none, cough- none , dullness-none, rub- none           Chest wall-  Abd- Br/ Gen/ Rectal- Not done, not indicated  Extrem- cyanosis- none, clubbing, none, atrophy- none, strength- nl Neuro- grossly intact to observation

## 2020-09-09 NOTE — Patient Instructions (Signed)
We can continue CPAP auto 5-20  Please call if we can help 

## 2020-09-15 NOTE — Assessment & Plan Note (Signed)
Benefits from CPAP with good compliance and control Plan- continue auto 5-20 

## 2020-09-15 NOTE — Assessment & Plan Note (Signed)
Would benefit from weight loss/ Emphasis on diet and exercise

## 2020-09-24 DIAGNOSIS — G4733 Obstructive sleep apnea (adult) (pediatric): Secondary | ICD-10-CM | POA: Diagnosis not present

## 2020-09-30 DIAGNOSIS — N401 Enlarged prostate with lower urinary tract symptoms: Secondary | ICD-10-CM | POA: Diagnosis not present

## 2020-09-30 DIAGNOSIS — R972 Elevated prostate specific antigen [PSA]: Secondary | ICD-10-CM | POA: Diagnosis not present

## 2020-09-30 DIAGNOSIS — R350 Frequency of micturition: Secondary | ICD-10-CM | POA: Diagnosis not present

## 2020-12-22 DIAGNOSIS — G4733 Obstructive sleep apnea (adult) (pediatric): Secondary | ICD-10-CM | POA: Diagnosis not present

## 2021-04-06 DIAGNOSIS — G4733 Obstructive sleep apnea (adult) (pediatric): Secondary | ICD-10-CM | POA: Diagnosis not present

## 2021-07-12 DIAGNOSIS — H2513 Age-related nuclear cataract, bilateral: Secondary | ICD-10-CM | POA: Diagnosis not present

## 2021-07-12 DIAGNOSIS — H43812 Vitreous degeneration, left eye: Secondary | ICD-10-CM | POA: Diagnosis not present

## 2021-07-12 DIAGNOSIS — H02834 Dermatochalasis of left upper eyelid: Secondary | ICD-10-CM | POA: Diagnosis not present

## 2021-07-12 DIAGNOSIS — H10413 Chronic giant papillary conjunctivitis, bilateral: Secondary | ICD-10-CM | POA: Diagnosis not present

## 2021-07-12 DIAGNOSIS — H04123 Dry eye syndrome of bilateral lacrimal glands: Secondary | ICD-10-CM | POA: Diagnosis not present

## 2021-07-12 DIAGNOSIS — H47092 Other disorders of optic nerve, not elsewhere classified, left eye: Secondary | ICD-10-CM | POA: Diagnosis not present

## 2021-07-12 DIAGNOSIS — H02831 Dermatochalasis of right upper eyelid: Secondary | ICD-10-CM | POA: Diagnosis not present

## 2021-07-27 DIAGNOSIS — G4733 Obstructive sleep apnea (adult) (pediatric): Secondary | ICD-10-CM | POA: Diagnosis not present

## 2021-09-09 NOTE — Progress Notes (Signed)
HPI male former smoker followed for OSA, rhinitis, complicated by obesity, DM2, HBP, GERD, Hypercholesterolemia,  NPSG 10/16/03- AHI (RDI) 51/ hr, desat to 83%, body weight 220 lbs  ----------------------------------------------------------------------   09/09/20-  75 year old male former smoker followed for OSA, rhinitis, complicated by obesity, DM2, HBP, GERD, Hypercholesterolemia,  CPAP auto 5-20/[employer] Download- compliance 93%, AHI 2.3/ hr Body weight today-234 lbs Covid vax- none Flu vax- will get from PCP "I don't get Covid vaccine for the same reason I don't use prescription medicines." He reports being comfortable with CPAP . Discussed compliance and control.  09/10/21-  75 year old male former smoker followed for OSA, rhinitis, complicated by obesity, DM2, HBP, GERD, Hypercholesterolemia, BPH,  CPAP auto 5-20/[employer]   AirSense 10 AutoSet Download-compliance 97%, AHI 1.8/ hr Body weight today-229 lbs Covid vax-none Flu vax-declines Still favors non-allopathic management, avoiding vaccines and prescription meds. Sleeps well with CPAP and no concerns. Would like mask fitting to look for better mask fit.   ROS-see HPI   + = positive Constitutional:   weight loss, night sweats, fevers, chills, fatigue, lassitude. HEENT:   No-  headaches, difficulty swallowing, tooth/dental problems, sore throat,       No-  sneezing, itching, ear ache, nasal congestion, post nasal drip,  CV:  No-   chest pain, orthopnea, PND, swelling in lower extremities, anasarca, dizziness, palpitations Resp: No-   shortness of breath with exertion or at rest.              No-   productive cough,  No non-productive cough,  No- coughing up of blood.              No-   change in color of mucus.  No- wheezing.   Skin: No-   rash or lesions. GI:  No-   heartburn, indigestion, abdominal pain, nausea, vomiting, GU:  MS:  No-   joint pain or swelling.  Neuro-     nothing unusual Psych:  No- change in mood or  affect. No depression or anxiety.  No memory loss.  OBJ- Physical Exam General- Alert, Oriented, Affect-appropriate, Distress- none acute, looks well, +obese Skin- rash-none, lesions- none, excoriation- none Lymphadenopathy- none Head- atraumatic            Eyes- Gross vision intact, PERRLA, conjunctivae and secretions clear            Ears- Hearing, canals-normal            Nose- Clear, no-Septal dev, mucus, polyps, erosion, perforation             Throat- Mallampati II-III , mucosa clear , drainage- none, tonsils- atrophic Neck- flexible , trachea midline, no stridor , thyroid nl, carotid no bruit Chest - symmetrical excursion , unlabored           Heart/CV- RRR , no murmur , no gallop  , no rub, nl s1 s2                           - JVD- none , edema- none, stasis changes- none, varices- none           Lung- clear to P&A, wheeze- none, cough- none , dullness-none, rub- none           Chest wall-  Abd- Br/ Gen/ Rectal- Not done, not indicated Extrem- cyanosis- none, clubbing, none, atrophy- none, strength- nl Neuro- grossly intact to observation

## 2021-09-10 ENCOUNTER — Other Ambulatory Visit: Payer: Self-pay

## 2021-09-10 ENCOUNTER — Ambulatory Visit: Payer: PPO | Admitting: Internal Medicine

## 2021-09-10 ENCOUNTER — Encounter: Payer: Self-pay | Admitting: Internal Medicine

## 2021-09-10 VITALS — BP 128/80 | HR 65 | Ht 69.25 in | Wt 229.4 lb

## 2021-09-10 DIAGNOSIS — G4733 Obstructive sleep apnea (adult) (pediatric): Secondary | ICD-10-CM | POA: Diagnosis not present

## 2021-09-10 NOTE — Patient Instructions (Signed)
Order- Schedule mask fitting at sleep center  We can continue CPAP auto 5-20, mask of choice, humidifier, supplies, Airview/ card  Please call if we can help

## 2021-09-10 NOTE — Assessment & Plan Note (Signed)
Continues to benefit from CPAP with good compliance and control Plan- refer for mask fitting. Continue auto 5-20

## 2021-09-28 ENCOUNTER — Other Ambulatory Visit: Payer: Self-pay

## 2021-09-28 ENCOUNTER — Ambulatory Visit (HOSPITAL_BASED_OUTPATIENT_CLINIC_OR_DEPARTMENT_OTHER): Payer: PPO | Attending: Internal Medicine | Admitting: Internal Medicine

## 2021-09-28 DIAGNOSIS — G4733 Obstructive sleep apnea (adult) (pediatric): Secondary | ICD-10-CM

## 2021-11-12 DIAGNOSIS — G4733 Obstructive sleep apnea (adult) (pediatric): Secondary | ICD-10-CM | POA: Diagnosis not present

## 2022-04-20 DIAGNOSIS — G4733 Obstructive sleep apnea (adult) (pediatric): Secondary | ICD-10-CM | POA: Diagnosis not present

## 2022-07-13 DIAGNOSIS — H02834 Dermatochalasis of left upper eyelid: Secondary | ICD-10-CM | POA: Diagnosis not present

## 2022-07-13 DIAGNOSIS — H40012 Open angle with borderline findings, low risk, left eye: Secondary | ICD-10-CM | POA: Diagnosis not present

## 2022-07-13 DIAGNOSIS — H2513 Age-related nuclear cataract, bilateral: Secondary | ICD-10-CM | POA: Diagnosis not present

## 2022-07-13 DIAGNOSIS — H02831 Dermatochalasis of right upper eyelid: Secondary | ICD-10-CM | POA: Diagnosis not present

## 2022-07-13 DIAGNOSIS — H10413 Chronic giant papillary conjunctivitis, bilateral: Secondary | ICD-10-CM | POA: Diagnosis not present

## 2022-07-13 DIAGNOSIS — H04123 Dry eye syndrome of bilateral lacrimal glands: Secondary | ICD-10-CM | POA: Diagnosis not present

## 2022-07-13 DIAGNOSIS — H43812 Vitreous degeneration, left eye: Secondary | ICD-10-CM | POA: Diagnosis not present

## 2022-09-10 NOTE — Progress Notes (Unsigned)
HPI male former smoker followed for OSA, rhinitis, complicated by obesity, DM2, HBP, GERD, Hypercholesterolemia,  NPSG 10/16/03- AHI (RDI) 51/ hr, desat to 83%, body weight 220 lbs  ----------------------------------------------------------------------   09/10/21-  76 year old male former smoker followed for OSA, rhinitis, complicated by obesity, DM2, HBP, GERD, Hypercholesterolemia, BPH,  CPAP auto 5-20/[employer]   AirSense 10 AutoSet Download-compliance 97%, AHI 1.8/ hr Body weight today-229 lbs Covid vax-none Flu vax-declines Still favors non-allopathic management, avoiding vaccines and prescription meds. Sleeps well with CPAP and no concerns. Would like mask fitting to look for better mask fit.   09/12/22- 76 year old male former smoker followed for OSA, rhinitis, complicated by obesity, DM2, HBP, GERD, Hypercholesterolemia, BPH,  CPAP auto 5-20/[employer]   AirSense 10 AutoSet Download-compliance 97%, AHI 3.2/ hr Body weight today 227 lbs Covid vax-none Flu vax-declines Playing pickleball 3x/ week and feels well.Still not taking vaccines or prescription meds. Download reviewed  ROS-see HPI   + = positive Constitutional:   weight loss, night sweats, fevers, chills, fatigue, lassitude. HEENT:   No-  headaches, difficulty swallowing, tooth/dental problems, sore throat,       No-  sneezing, itching, ear ache, nasal congestion, post nasal drip,  CV:  No-   chest pain, orthopnea, PND, swelling in lower extremities, anasarca, dizziness, palpitations Resp: No-   shortness of breath with exertion or at rest.              No-   productive cough,  No non-productive cough,  No- coughing up of blood.              No-   change in color of mucus.  No- wheezing.   Skin: No-   rash or lesions. GI:  No-   heartburn, indigestion, abdominal pain, nausea, vomiting, GU:  MS:  No-   joint pain or swelling.  Neuro-     nothing unusual Psych:  No- change in mood or affect. No depression or anxiety.  No  memory loss.  OBJ- Physical Exam General- Alert, Oriented, Affect-appropriate, Distress- none acute,  +overweight Skin- rash-none, lesions- none, excoriation- none Lymphadenopathy- none Head- atraumatic            Eyes- Gross vision intact, PERRLA, conjunctivae and secretions clear            Ears- Hearing, canals-normal            Nose- Clear, no-Septal dev, mucus, polyps, erosion, perforation             Throat- Mallampati II-III , mucosa clear , drainage- none, tonsils- atrophic Neck- flexible , trachea midline, no stridor , thyroid nl, carotid no bruit Chest - symmetrical excursion , unlabored           Heart/CV- RRR , no murmur , no gallop  , no rub, nl s1 s2                           - JVD- none , edema- none, stasis changes- none, varices- none           Lung- clear to P&A, wheeze- none, cough- none , dullness-none, rub- none           Chest wall-  Abd- Br/ Gen/ Rectal- Not done, not indicated Extrem- cyanosis- none, clubbing, none, atrophy- none, strength- nl Neuro- grossly intact to observation

## 2022-09-12 ENCOUNTER — Ambulatory Visit (INDEPENDENT_AMBULATORY_CARE_PROVIDER_SITE_OTHER): Payer: PPO | Admitting: Internal Medicine

## 2022-09-12 ENCOUNTER — Encounter: Payer: Self-pay | Admitting: Internal Medicine

## 2022-09-12 DIAGNOSIS — G4733 Obstructive sleep apnea (adult) (pediatric): Secondary | ICD-10-CM | POA: Diagnosis not present

## 2022-09-12 DIAGNOSIS — E669 Obesity, unspecified: Secondary | ICD-10-CM

## 2022-09-12 NOTE — Assessment & Plan Note (Signed)
Benefits from CPAP with good compliance and control Plan- continue auto 5-20 

## 2022-09-12 NOTE — Assessment & Plan Note (Signed)
Stable. Looks well. Exercising regularly.

## 2022-09-12 NOTE — Patient Instructions (Signed)
We can continue CPAP auto 5-20  Please call if we can help 

## 2022-09-16 DIAGNOSIS — G4733 Obstructive sleep apnea (adult) (pediatric): Secondary | ICD-10-CM | POA: Diagnosis not present

## 2022-12-19 ENCOUNTER — Ambulatory Visit (INDEPENDENT_AMBULATORY_CARE_PROVIDER_SITE_OTHER): Payer: PPO | Admitting: Family Medicine

## 2022-12-19 VITALS — BP 143/94 | HR 93 | Temp 97.9°F | Ht 69.0 in | Wt 218.4 lb

## 2022-12-19 DIAGNOSIS — M7989 Other specified soft tissue disorders: Secondary | ICD-10-CM | POA: Diagnosis not present

## 2022-12-19 DIAGNOSIS — R0609 Other forms of dyspnea: Secondary | ICD-10-CM

## 2022-12-19 DIAGNOSIS — M79662 Pain in left lower leg: Secondary | ICD-10-CM

## 2022-12-19 LAB — COMPREHENSIVE METABOLIC PANEL
ALT: 20 U/L (ref 0–53)
AST: 15 U/L (ref 0–37)
Albumin: 4.6 g/dL (ref 3.5–5.2)
Alkaline Phosphatase: 123 U/L — ABNORMAL HIGH (ref 39–117)
BUN: 16 mg/dL (ref 6–23)
CO2: 27 mEq/L (ref 19–32)
Calcium: 10.2 mg/dL (ref 8.4–10.5)
Chloride: 102 mEq/L (ref 96–112)
Creatinine, Ser: 0.86 mg/dL (ref 0.40–1.50)
GFR: 84.22 mL/min (ref >60.00)
Glucose, Bld: 157 mg/dL — ABNORMAL HIGH (ref 70–99)
Potassium: 4.9 mEq/L (ref 3.5–5.1)
Sodium: 139 mEq/L (ref 135–145)
Total Bilirubin: 0.6 mg/dL (ref 0.2–1.2)
Total Protein: 7.4 g/dL (ref 6.0–8.3)

## 2022-12-19 LAB — CBC WITH DIFFERENTIAL/PLATELET
Basophils Absolute: 0.1 10*3/uL (ref 0.0–0.1)
Basophils Relative: 0.9 % (ref 0.0–3.0)
Eosinophils Absolute: 0.1 10*3/uL (ref 0.0–0.7)
Eosinophils Relative: 1.4 % (ref 0.0–5.0)
HCT: 48.7 % (ref 39.0–52.0)
Hemoglobin: 17 g/dL (ref 13.0–17.0)
Lymphocytes Relative: 21.6 % (ref 12.0–46.0)
Lymphs Abs: 1.4 10*3/uL (ref 0.7–4.0)
MCHC: 34.8 g/dL (ref 30.0–36.0)
MCV: 87 fl (ref 78.0–100.0)
Monocytes Absolute: 0.5 10*3/uL (ref 0.1–1.0)
Monocytes Relative: 7.4 % (ref 3.0–12.0)
Neutro Abs: 4.3 10*3/uL (ref 1.4–7.7)
Neutrophils Relative %: 68.7 % (ref 43.0–77.0)
Platelets: 215 10*3/uL (ref 150.0–400.0)
RBC: 5.6 Mil/uL (ref 4.22–5.81)
RDW: 13.2 % (ref 11.5–15.5)
WBC: 6.3 10*3/uL (ref 4.0–10.5)

## 2022-12-19 NOTE — Progress Notes (Signed)
OFFICE VISIT  12/19/2022  CC:  Chief Complaint  Patient presents with   Cough    X 2 weeks. C/o shortness of breath with little exertion. Dry cough but minor. Denies prior use of inhaler.     Patient is a 77 y.o. male who presents for breathing concerns.  HPI: Approximately 2 to 3 weeks ago Wayne Lamb began to notice that he felt short of breath when doing minimal activity such as walking to the bathroom in his house and back to the family room for walking out to his mailbox and back. Would not take long to get his breathing back to normal.  No shortness of breath at rest.  He does not get any chest pain or chest tightness.  No dizziness, no palpitations, no nausea, no cough or wheeze, no fever. He also had pain in the left calf when all of this started, lasted about a week and then has gone away. Has residual swelling in the left leg, none on the right.  ROS as above, plus-->  no HAs, no rashes, no melena/hematochezia.  No polyuria or polydipsia.  No myalgias or arthralgias.  No focal weakness, paresthesias, or tremors.  No acute vision or hearing abnormalities.  No dysuria or unusual/new urinary urgency or frequency.  No n/v/d or abd pain.     Past Medical History:  Diagnosis Date   DM (diabetes mellitus), type 2 ([place]) 03/2016   HbA1c 6.2% on 01/18/17.  Initially dx'd by fasting glucose criteria.   Elevated PSA    MRI prostate was done 2016 to eval persistently rising PSAs (no biopsy had been done): this showed no sign of prostate ca.  Stable 07/2017, 01/2018, 01/2019, 12/2019, 05/2020.   Encounter for hepatitis C virus screening test for high risk patient    Done by [facility] physicians 01/18/17   Essential hypertension    pt has declined to take med in past (as of 12/2016)   GERD (gastroesophageal reflux disease)    Hypercholesterolemia    Lumbar spondylosis    MRI L spine 2005: severe facet deg, SI deg   Obesity, Class II, BMI 35-39.9    OSA on CPAP    compliant   Prostate nodule 2016    prostate nodule or ridge in R prostate lobe--w/u reassuring 2016    Rhinitis    Tubular adenoma of colon 2014   recommend repeat colonoscopy 2019    Past Surgical History:  Procedure Laterality Date   INGUINAL HERNIA REPAIR Left    right clavicle fracture     pin inserted (Dr. Eddie Dibbles)   TONSILLECTOMY      Outpatient Medications Prior to Visit  Medication Sig Dispense Refill   Ascorbic Acid (VITAMIN C PO) Take by mouth daily.     Multiple Vitamins-Minerals (ZINC PO) Take by mouth daily.     VITAMIN D PO Take by mouth daily.     VITAMIN K PO Take by mouth daily.     No facility-administered medications prior to visit.    No Known Allergies  Review of Systems  As per HPI  PE:    12/19/2022    1:36 PM 09/12/2022   10:08 AM 09/10/2021   10:21 AM  Vitals with BMI  Height '5\' 9"'$  '5\' 9"'$  5' 9.25"  Weight 218 lbs 6 oz 227 lbs 3 oz 229 lbs 6 oz  BMI 32.24 74.08 14.48  Systolic 185 631 497  Diastolic 94 98 80  Pulse 93 68 65     Physical  Exam  Gen: Alert, well appearing.  Patient is oriented to person, place, time, and situation. AFFECT: pleasant, lucid thought and speech. JME:QAST: no injection, icteris, swelling, or exudate.  EOMI, PERRLA. Mouth: lips without lesion/swelling.  Oral mucosa pink and moist. Oropharynx without erythema, exudate, or swelling.  Neck - No masses or thyromegaly or limitation in range of motion CV: RRR, no m/r/g.   LUNGS: CTA bilat, nonlabored resps, good aeration in all lung fields. EXT: no clubbing or cyanosis.  Right leg no pitting edema.  Left leg 1+ pitting edema from about mid tibia down to foot.  No tenderness, no erythema. Edema.  Left calf circumference measured 10 cm below the inferior pole of the patella is 39cm. Right is 36.5cm   LABS:  Last CBC Lab Results  Component Value Date   WBC 5.3 08/23/2019   HGB 16.4 08/23/2019   HCT 48.4 08/23/2019   MCV 88.5 08/23/2019   RDW 14.2 08/23/2019   PLT 171.0 41/96/2229   Last metabolic  panel Lab Results  Component Value Date   GLUCOSE 135 (H) 08/23/2019   NA 142 08/23/2019   K 4.7 08/23/2019   CL 105 08/23/2019   CO2 28 08/23/2019   BUN 17 08/23/2019   CREATININE 1.01 08/23/2019   CALCIUM 9.8 08/23/2019   PROT 7.0 08/23/2019   ALBUMIN 4.5 08/23/2019   BILITOT 0.5 08/23/2019   ALKPHOS 112 08/23/2019   AST 19 08/23/2019   ALT 31 08/23/2019     Lab Results  Component Value Date   HGBA1C 6.2 08/23/2019   IMPRESSION AND PLAN:  New onset dyspnea on exertion.  This coincided with left calf pain. Rule out DVT and PE. CBC, c-Met, and D-dimer today. Arrange CT angio chest ASAP.  Will also get venous ultrasound left leg.  An After Visit Summary was printed and given to the patient.  FOLLOW UP: Return for TBD.  Signed:  Crissie Sickles, MD           12/19/2022

## 2022-12-20 ENCOUNTER — Telehealth: Payer: Self-pay

## 2022-12-20 ENCOUNTER — Inpatient Hospital Stay (HOSPITAL_COMMUNITY)
Admission: EM | Admit: 2022-12-20 | Discharge: 2022-12-23 | DRG: 176 | Disposition: A | Discharge status: Home / Self Care | Payer: PPO | Attending: Internal Medicine | Admitting: Internal Medicine

## 2022-12-20 ENCOUNTER — Ambulatory Visit (HOSPITAL_BASED_OUTPATIENT_CLINIC_OR_DEPARTMENT_OTHER): Payer: PPO

## 2022-12-20 ENCOUNTER — Inpatient Hospital Stay (HOSPITAL_BASED_OUTPATIENT_CLINIC_OR_DEPARTMENT_OTHER)
Admission: RE | Admit: 2022-12-20 | Discharge: 2022-12-20 | Disposition: A | Discharge status: Home / Self Care | Payer: PPO | Source: Ambulatory Visit | Attending: Family Medicine | Admitting: Family Medicine

## 2022-12-20 ENCOUNTER — Encounter (HOSPITAL_BASED_OUTPATIENT_CLINIC_OR_DEPARTMENT_OTHER): Payer: Self-pay

## 2022-12-20 ENCOUNTER — Encounter (HOSPITAL_COMMUNITY): Payer: Self-pay | Admitting: Internal Medicine

## 2022-12-20 ENCOUNTER — Other Ambulatory Visit: Payer: Self-pay

## 2022-12-20 ENCOUNTER — Ambulatory Visit (HOSPITAL_BASED_OUTPATIENT_CLINIC_OR_DEPARTMENT_OTHER)
Admission: RE | Admit: 2022-12-20 | Discharge: 2022-12-20 | Disposition: A | Discharge status: Home / Self Care | Payer: PPO | Source: Ambulatory Visit | Attending: Family Medicine | Admitting: Family Medicine

## 2022-12-20 DIAGNOSIS — E1165 Type 2 diabetes mellitus with hyperglycemia: Secondary | ICD-10-CM | POA: Diagnosis present

## 2022-12-20 DIAGNOSIS — Z8249 Family history of ischemic heart disease and other diseases of the circulatory system: Secondary | ICD-10-CM | POA: Diagnosis not present

## 2022-12-20 DIAGNOSIS — M79662 Pain in left lower leg: Secondary | ICD-10-CM

## 2022-12-20 DIAGNOSIS — R0609 Other forms of dyspnea: Secondary | ICD-10-CM | POA: Insufficient documentation

## 2022-12-20 DIAGNOSIS — Z79899 Other long term (current) drug therapy: Secondary | ICD-10-CM | POA: Diagnosis not present

## 2022-12-20 DIAGNOSIS — I2699 Other pulmonary embolism without acute cor pulmonale: Secondary | ICD-10-CM | POA: Diagnosis not present

## 2022-12-20 DIAGNOSIS — I10 Essential (primary) hypertension: Secondary | ICD-10-CM | POA: Diagnosis not present

## 2022-12-20 DIAGNOSIS — R739 Hyperglycemia, unspecified: Secondary | ICD-10-CM

## 2022-12-20 DIAGNOSIS — Z6831 Body mass index (BMI) 31.0-31.9, adult: Secondary | ICD-10-CM | POA: Diagnosis not present

## 2022-12-20 DIAGNOSIS — E7800 Pure hypercholesterolemia, unspecified: Secondary | ICD-10-CM | POA: Diagnosis not present

## 2022-12-20 DIAGNOSIS — I2692 Saddle embolus of pulmonary artery without acute cor pulmonale: Principal | ICD-10-CM | POA: Diagnosis present

## 2022-12-20 DIAGNOSIS — G4733 Obstructive sleep apnea (adult) (pediatric): Secondary | ICD-10-CM | POA: Diagnosis present

## 2022-12-20 DIAGNOSIS — Z87891 Personal history of nicotine dependence: Secondary | ICD-10-CM

## 2022-12-20 DIAGNOSIS — K219 Gastro-esophageal reflux disease without esophagitis: Secondary | ICD-10-CM | POA: Diagnosis not present

## 2022-12-20 DIAGNOSIS — Z811 Family history of alcohol abuse and dependence: Secondary | ICD-10-CM | POA: Diagnosis not present

## 2022-12-20 DIAGNOSIS — E669 Obesity, unspecified: Secondary | ICD-10-CM | POA: Diagnosis not present

## 2022-12-20 DIAGNOSIS — M7989 Other specified soft tissue disorders: Secondary | ICD-10-CM | POA: Insufficient documentation

## 2022-12-20 DIAGNOSIS — I2602 Saddle embolus of pulmonary artery with acute cor pulmonale: Secondary | ICD-10-CM | POA: Diagnosis not present

## 2022-12-20 LAB — COMPREHENSIVE METABOLIC PANEL
ALT: 22 U/L (ref 0–44)
AST: 20 U/L (ref 15–41)
Albumin: 3.9 g/dL (ref 3.5–5.0)
Alkaline Phosphatase: 94 U/L (ref 38–126)
Anion gap: 11 (ref 5–15)
BUN: 16 mg/dL (ref 8–23)
CO2: 27 mmol/L (ref 22–32)
Calcium: 9.3 mg/dL (ref 8.9–10.3)
Chloride: 99 mmol/L (ref 98–111)
Creatinine, Ser: 1.03 mg/dL (ref 0.61–1.24)
GFR, Estimated: >60 mL/min (ref >60)
Glucose, Bld: 157 mg/dL — ABNORMAL HIGH (ref 70–99)
Potassium: 4.5 mmol/L (ref 3.5–5.1)
Sodium: 137 mmol/L (ref 135–145)
Total Bilirubin: 0.9 mg/dL (ref 0.3–1.2)
Total Protein: 7.5 g/dL (ref 6.5–8.1)

## 2022-12-20 LAB — CBC WITH DIFFERENTIAL/PLATELET
Abs Immature Granulocytes: 0.03 10*3/uL (ref 0.00–0.07)
Basophils Absolute: 0.1 10*3/uL (ref 0.0–0.1)
Basophils Relative: 1 %
Eosinophils Absolute: 0.1 10*3/uL (ref 0.0–0.5)
Eosinophils Relative: 2 %
HCT: 46.9 % (ref 39.0–52.0)
Hemoglobin: 16.3 g/dL (ref 13.0–17.0)
Immature Granulocytes: 1 %
Lymphocytes Relative: 23 %
Lymphs Abs: 1.4 10*3/uL (ref 0.7–4.0)
MCH: 29.9 pg (ref 26.0–34.0)
MCHC: 34.8 g/dL (ref 30.0–36.0)
MCV: 85.9 fL (ref 80.0–100.0)
Monocytes Absolute: 0.5 10*3/uL (ref 0.1–1.0)
Monocytes Relative: 8 %
Neutro Abs: 4.1 10*3/uL (ref 1.7–7.7)
Neutrophils Relative %: 65 %
Platelets: 195 10*3/uL (ref 150–400)
RBC: 5.46 MIL/uL (ref 4.22–5.81)
RDW: 13.2 % (ref 11.5–15.5)
WBC: 6.2 10*3/uL (ref 4.0–10.5)
nRBC: 0 % (ref 0.0–0.2)

## 2022-12-20 LAB — TROPONIN I (HIGH SENSITIVITY)
Troponin I (High Sensitivity): 10 ng/L (ref <18)
Troponin I (High Sensitivity): 10 ng/L (ref <18)

## 2022-12-20 LAB — PROTIME-INR
INR: 1 (ref 0.8–1.2)
Prothrombin Time: 13.2 seconds (ref 11.4–15.2)

## 2022-12-20 LAB — D-DIMER, QUANTITATIVE: D-Dimer, Quant: 3.46 mcg/mL FEU — ABNORMAL HIGH (ref <0.50)

## 2022-12-20 LAB — APTT: aPTT: 26 seconds (ref 24–36)

## 2022-12-20 LAB — BRAIN NATRIURETIC PEPTIDE: B Natriuretic Peptide: 18.9 pg/mL (ref 0.0–100.0)

## 2022-12-20 MED ORDER — ONDANSETRON HCL 4 MG PO TABS: 4.0000 mg | ORAL_TABLET | Freq: Four times a day (QID) | ORAL | Status: DC | PRN

## 2022-12-20 MED ORDER — HEPARIN (PORCINE) 25000 UT/250ML-% IV SOLN
1500.0000 [IU]/h | INTRAVENOUS | Status: DC
  Administered 2022-12-20 – 2022-12-22 (×4): 1500 [IU]/h via INTRAVENOUS
  Filled 2022-12-20 (×4): qty 250

## 2022-12-20 MED ORDER — ACETAMINOPHEN 650 MG RE SUPP: 650.0000 mg | Freq: Four times a day (QID) | RECTAL | Status: DC | PRN

## 2022-12-20 MED ORDER — HEPARIN BOLUS VIA INFUSION
5000.0000 [IU] | Freq: Once | INTRAVENOUS | Status: AC
  Administered 2022-12-20: 5000 [IU] via INTRAVENOUS
  Filled 2022-12-20: qty 5000

## 2022-12-20 MED ORDER — ONDANSETRON HCL 4 MG/2ML IJ SOLN: 4.0000 mg | Freq: Four times a day (QID) | INTRAMUSCULAR | Status: DC | PRN

## 2022-12-20 MED ORDER — ACETAMINOPHEN 325 MG PO TABS: 650.0000 mg | ORAL_TABLET | Freq: Four times a day (QID) | ORAL | Status: DC | PRN

## 2022-12-20 MED ORDER — IOHEXOL 350 MG/ML SOLN
100.0000 mL | Freq: Once | INTRAVENOUS | Status: AC | PRN
  Administered 2022-12-20: 80 mL via INTRAVENOUS

## 2022-12-20 MED ORDER — MELATONIN 5 MG PO TABS: 10.0000 mg | ORAL_TABLET | Freq: Every evening | ORAL | Status: DC | PRN

## 2022-12-20 NOTE — ED Triage Notes (Signed)
Sent from imaging due to PE.  SOB with execration.  Also c/o left calf pain

## 2022-12-20 NOTE — ED Notes (Signed)
Lab will add on aPTT.

## 2022-12-20 NOTE — ED Provider Triage Note (Signed)
Emergency Medicine Provider Triage Evaluation Note  WOJCIECH WILLETTS , a 77 y.o. male  was evaluated in triage.  Pt complains of shortness of breath ongoing for a couple of weeks has also been having left calf pain, was seen by his PCP who did workup and patient had outpatient CT showing pulmonary embolus with saddle component.  Patient has complaints of some mild shortness of breath at rest at this time but states otherwise he feels well, no history of VTE.  Review of Systems  Positive:  Negative:   Physical Exam  BP (!) 189/108 (BP Location: Right Arm) Comment: Simultaneous filing. User may not have seen previous data. Comment (BP Location): Simultaneous filing. User may not have seen previous data.  Pulse 83 Comment: Simultaneous filing. User may not have seen previous data.  Temp 97.6 F (36.4 C) (Oral) Comment: Simultaneous filing. User may not have seen previous data.  Resp 16 Comment: Simultaneous filing. User may not have seen previous data.  Ht '5\' 9"'$  (1.753 m)   Wt 99.1 kg   SpO2 95% Comment: Simultaneous filing. User may not have seen previous data.  BMI 32.25 kg/m  Gen:   Awake, no distress   Resp:  Normal effort  MSK:   Moves extremities without difficulty  Other:    Medical Decision Making  Medically screening exam initiated at 4:53 PM.  Appropriate orders placed.  Cathie Olden was informed that the remainder of the evaluation will be completed by another provider, this initial triage assessment does not replace that evaluation, and the importance of remaining in the ED until their evaluation is complete.     Gwenevere Abbot, [place] 12/20/22 1702

## 2022-12-20 NOTE — ED Provider Notes (Cosign Needed Addendum)
[place] EMERGENCY DEPARTMENT AT [facility] Provider Note   CSN: [id number] Arrival date & time: 12/20/22  8938     History  Chief Complaint  Patient presents with   Shortness of Breath    Wayne Lamb is a 77 y.o. male with past medical history of Diabetes, Hypertension who presents with a two week history of dyspnea on exertion.  He states about 2 weeks ago he first started developing some pain on his left calf as well as some swelling as well.  The pain subsequently went away after couple days, but then he started becoming dyspneic on exertion.  Patient plays pickle ball and was noticeably becoming more and more winded to the point where he had to stop playing.  He went to his PCP office yesterday for evaluation, and clot was suspected.  D-dimer came back elevated at around 3, and CT angio done revealed saddle embolus at the bifurcation of the pulmonary arteries.  He denies any recent prolonged immobilizations, recent surgeries, history of bleeding disorders, history of cancer.  He does have a remote history of smoking cigarettes, but stopped in 1981.  He also denies chest pain, nausea, vomiting, or recent fevers.   Shortness of Breath      Home Medications Prior to Admission medications   Medication Sig Start Date End Date Taking? Authorizing Provider  Ascorbic Acid (VITAMIN C PO) Take 1 tablet by mouth daily.   Yes [provider]  Cholecalciferol (VITAMIN D3 PO) Take 1 capsule by mouth daily.   Yes [provider]  ibuprofen (ADVIL) 200 MG tablet Take 200-400 mg by mouth daily as needed for mild pain (or muscle soreness).   Yes [provider]  NON FORMULARY 1 capsule by Other route See admin instructions. Inflamma-X capsules- Take 1 capsule by mouth every other day   Yes [provider]  NON FORMULARY Take 1 capsule by mouth See admin instructions. Hormone Balance capsules- Take 1 capsule by mouth every other day   Yes [provider]  QUERCETIN PO Take 1 capsule by mouth daily.   Yes [provider]  VITAMIN K PO Take 1 tablet by mouth daily.   Yes [provider]  ZINC CITRATE PO Take 1 tablet by mouth daily.   Yes [provider]      Allergies    Patient has no known allergies.    Review of Systems   Review of Systems  Respiratory:  Positive for shortness of breath.     Physical Exam Updated Vital Signs BP (!) 165/98   Pulse 77   Temp 97.6 F (36.4 C) (Oral) Comment: Simultaneous filing. User may not have seen previous data.  Resp 20   Ht '5\' 9"'$  (1.753 m)   Wt 99.1 kg   SpO2 95%   BMI 32.25 kg/m  Physical Exam Constitutional:      General: He is not in acute distress.    Appearance: He is obese. He is not ill-appearing or diaphoretic.  Cardiovascular:     Rate and Rhythm: Normal rate and regular rhythm.     Pulses: No decreased pulses.  Pulmonary:     Effort: Pulmonary effort is normal. No tachypnea, bradypnea, accessory muscle usage or respiratory distress.     Breath sounds: Normal breath sounds.  Musculoskeletal:     Left lower leg: Edema present.     Comments: +1 non-pitting Edema in RLE. Non-tender to palpation, firm compared to LLE  Neurological:  Mental Status: He is alert.       ED Results / Procedures / Treatments   Labs (all labs ordered are listed, but only abnormal results are displayed) Labs Reviewed  COMPREHENSIVE METABOLIC PANEL - Abnormal; Notable for the following components:      Result Value   Glucose, Bld 157 (*)    All other components within normal limits  CBC WITH DIFFERENTIAL/PLATELET  PROTIME-INR  BRAIN NATRIURETIC PEPTIDE  TROPONIN I (HIGH SENSITIVITY)  TROPONIN I (HIGH SENSITIVITY)    EKG None  Radiology CT Angio Chest W/Cm &/Or Wo Cm  Result Date: 12/20/2022 CLINICAL DATA:  Shortness of breath for 2 weeks.  Elevated D-dimer. EXAM: CT ANGIOGRAPHY CHEST WITH CONTRAST TECHNIQUE: Multidetector CT imaging of  the chest was performed using the standard protocol during bolus administration of intravenous contrast. Multiplanar CT image reconstructions and MIPs were obtained to evaluate the vascular anatomy. RADIATION DOSE REDUCTION: This exam was performed according to the departmental dose-optimization program which includes automated exposure control, adjustment of the mA and/or kV according to patient size and/or use of iterative reconstruction technique. CONTRAST:  12m OMNIPAQUE IOHEXOL 350 MG/ML SOLN COMPARISON:  Chest x-ray 2006 report. FINDINGS: Cardiovascular: The heart is nonenlarged. Trace pericardial effusion. The thoracic aorta has a overall normal course and caliber with minimal atherosclerotic changes. More prominent calcified plaque along the proximal vertebral arteries at the edge of the imaging field and along the coronary arteries. Significant pulmonary emboli identified including a saddle embolus across the bifurcation of the main pulmonary artery. Extensive clot burden in the right lower lobe with poor enhancement. Clot also seen extending along the lobar pulmonary artery of the middle lobe and segmental in the right upper lobe. On the left there some central areas of thrombus greatest in the left upper lobe pulmonary artery. No shift of the ventricular septum to suggest right heart strain but there is some enlargement of the pulmonary artery. Please correlate for any clinical evidence of right heart strain. Mediastinum/Nodes: No specific abnormal lymph node enlargement identified in the axillary region, hilum or mediastinum. Moderate hiatal hernia. Normal caliber thoracic esophagus. Lungs/Pleura: Right lower lobe nodule identified in the costophrenic angle. On axial series 6 image 130 this measures 2.1 by 1.6 cm and is noncalcified. Elsewhere there is no consolidation, pneumothorax or effusion. Mild linear opacity seen elsewhere along the lung bases likely scar or atelectasis. Few punctate nodules  identified in the middle lobe such as series 6, image 84, under 3 mm. Slightly larger focus on image 92 in the middle lobe measuring 6 x 3 mm. Upper Abdomen: In the upper abdomen the adrenal glands are preserved. There is a right renal lesion at the edge of the imaging field with diameter of 5.2 cm. This has Hounsfield unit of 4 consistent with cyst but is incompletely included in the imaging field recommend dedicated workup when appropriate to confirm a benign cyst such as ultrasound. Please correlate with any prior. Musculoskeletal: Scattered degenerative changes along the spine. Review of the MIP images confirms the above findings. IMPRESSION: Large pulmonary embolism identified with saddle component. Significant areas of thrombus with poor enhancement particularly of the right lower lobe and left upper lobe. There is some enlargement of the pulmonary artery but no clear intraventricular septal shift. Please correlate for any clinical evidence of right heart strain. Noncalcified 2.1 cm right lower lobe lung nodule. Please as well correlate with any prior study to assess for stability. Consider one of the following in 3 months for  both low-risk and high-risk individuals: (a) repeat chest CT, (b) follow-up PET-CT, or (c) tissue sampling. This recommendation follows the consensus statement: Guidelines for Management of Incidental Pulmonary Nodules Detected on CT Images: From the Fleischner Society 2017; Radiology 2017; 284:228-243. Moderate hiatal hernia. Probable right renal cyst at the edge of the imaging field but this lesion is not completely included in the imaging field of this chest exam and would recommend further workup when appropriate if there is no prior such as a renal ultrasound. Critical Value/emergent results were called by telephone at the time of interpretation on 12/20/2022 at 2:46 pm to provider [facility] , who verbally acknowledged these results. Aortic Atherosclerosis (ICD10-I70.0).  Electronically Signed   By: Jill Side M.D.   On: 12/20/2022 15:10      Medications Ordered in ED Medications - No data to display  ED Course/ Medical Decision Making/ A&P                             Medical Decision Making Patient presents with 2-week history of dyspnea, and was found to have saddle embolus in the main pulmonary arteries.  There is no clear evidence of right heart strain on CT.  May have had DVT in the left lower extremity.  D-dimer also elevated.  Unsure what sources of embolism is.  Patient does not have a history of malignancy, immunosuppression, prolonged immobilization, history of bleeding disorders.  He is currently not hypoxic, and is saturating on room air at 96%, and is also in no acute distress.  Case with pulmonology, recommended no acute intervention, and to start heparin infusion.  Also recommended bedrest for 24 hours before switching to a NOAC permanently.  Patient would likely benefit from lower extremity ultrasound study, and echo to evaluate for right heart strain.  Discussed case with hospitalist team, and they have agreed to admission.  Risk Decision regarding hospitalization.     Final Clinical Impression(s) / ED Diagnoses Final diagnoses:  Acute saddle pulmonary embolism, unspecified whether acute cor pulmonale present [facility])    Rx / DC Orders ED Discharge Orders     None         Aaralyn Kil, Marlene Lard, MD 12/20/22 Remus Loffler    Drucie Opitz, MD 12/20/22 Donald, Speed, DO 12/21/22 [phone]

## 2022-12-20 NOTE — H&P (Signed)
History and Physical    Wayne Lamb YQM:[id number] DOB: 07-Dec-1945 DOA: 12/20/2022  DOS: the patient was seen and examined on 12/20/2022  PCP: [name], MD   Patient coming from: Home  I have personally briefly reviewed patient's old medical records in [facility]  CC: SOB, saddle PE on CT HPI: 77 yo WM without significant medical history presents to ER from outpatient CT with saddle PE.  Pt with 1-2 weeks with dyspnea. Especially on exertion. Pt is a avid Doctor, general practice. Pt noted increasing dyspnea when playing pickleball. No chest pain.  Pt remember his left calf being painful for 2-3 days but persistently swollen for the last 4 weeks. Pt states he and wife drove to [place] in November. Spent 8 hours in the car. Took limited breaks.  Went to PCP office for evaluation of exertional dyspnea. D-dimer was elevated. CTPA performed as outpatient that showed saddle PE. Pt sent to ER for evaluation.  On arrival temp 97.6 heart rate 83 blood pressure 189/108 satting 95% on room air.  Labs showed white count 6.2, hemoglobin 16.3, platelets 185  CMP unremarkable except for serum glucose 157  CTPA reviewed from today which demonstrates saddle PE.  There is also a significant burden in the right lower lobe.  Right middle lobe and segmental in the right upper lobe.  [facility] hospitalist contacted for admission.   ED Course: CTPA reviewed. Shows saddle PE.   Review of Systems:  Review of Systems  Constitutional: Negative.   HENT: Negative.    Eyes: Negative.   Cardiovascular:  Positive for leg swelling.       Exertional dyspnea  Gastrointestinal: Negative.   Genitourinary: Negative.   Musculoskeletal: Negative.   Skin: Negative.   Neurological: Negative.   Endo/Heme/Allergies: Negative.   Psychiatric/Behavioral: Negative.    All other systems reviewed and are negative.   Past Medical History:  Diagnosis Date   DM (diabetes mellitus), type 2 ([place]) 03/2016   HbA1c  6.2% on 01/18/17.  Initially dx'd by fasting glucose criteria.   Elevated PSA    MRI prostate was done 2016 to eval persistently rising PSAs (no biopsy had been done): this showed no sign of prostate ca.  Stable 07/2017, 01/2018, 01/2019, 12/2019, 05/2020.   Encounter for hepatitis C virus screening test for high risk patient    Done by [facility] physicians 01/18/17   Essential hypertension    pt has declined to take med in past (as of 12/2016)   GERD (gastroesophageal reflux disease)    Hypercholesterolemia    Lumbar spondylosis    MRI L spine 2005: severe facet deg, SI deg   Obesity, Class II, BMI 35-39.9    OSA on CPAP    compliant   Prostate nodule 2016   prostate nodule or ridge in R prostate lobe--w/u reassuring 2016    Rhinitis    Tubular adenoma of colon 2014   recommend repeat colonoscopy 2019    Past Surgical History:  Procedure Laterality Date   INGUINAL HERNIA REPAIR Left    right clavicle fracture     pin inserted (Dr. Eddie Dibbles)   TONSILLECTOMY       reports that he quit smoking about 43 years ago. His smoking use included cigarettes. He has a 15.00 pack-year smoking history. He has never used smokeless tobacco. He reports current alcohol use of about 1.0 standard drink of alcohol per week. He reports that he does not use drugs.  No Known Allergies  Family History  Problem Relation Age of Onset   Alcohol abuse Father    Early death Father 60       unknown cause of death   Heart disease Mother    Heart disease Sister    Learning disabilities Brother        Dyslexia    Prior to Admission medications   Medication Sig Start Date End Date Taking? Authorizing Provider  Ascorbic Acid (VITAMIN C PO) Take 1 tablet by mouth daily.   Yes [provider]  Cholecalciferol (VITAMIN D3 PO) Take 1 capsule by mouth daily.   Yes [provider]  ibuprofen (ADVIL) 200 MG tablet Take 200-400 mg by mouth daily as needed for mild pain (or muscle soreness).   Yes [provider]  NON FORMULARY 1 capsule by Other route See admin instructions. Inflamma-X capsules- Take 1 capsule by mouth every other day   Yes [provider]  NON FORMULARY Take 1 capsule by mouth See admin instructions. Hormone Balance capsules- Take 1 capsule by mouth every other day   Yes [provider]  QUERCETIN PO Take 1 capsule by mouth daily.   Yes [provider]  VITAMIN K PO Take 1 tablet by mouth daily.   Yes [provider]  ZINC CITRATE PO Take 1 tablet by mouth daily.   Yes [provider]    Physical Exam: Vitals:   12/20/22 1815 12/20/22 1830 12/20/22 1900 12/20/22 1940  BP: (!) 192/92 (!) 178/94 (!) 165/98 (!) 154/89  Pulse: 77 81 77 74  Resp: 19 (!) 28 20 (!) 25  Temp:    97.8 F (36.6 C)  TempSrc:    Oral  SpO2: 96% 96% 95% 95%  Weight:      Height:        Physical Exam Vitals and nursing note reviewed.  Constitutional:      General: He is not in acute distress.    Appearance: He is obese. He is not ill-appearing, toxic-appearing or diaphoretic.  HENT:     Head: Normocephalic and atraumatic.     Nose: Nose normal.  Eyes:     General: No scleral icterus. Cardiovascular:     Rate and Rhythm: Normal rate and regular rhythm.     Pulses: Normal pulses.     Heart sounds: Normal heart sounds.  Pulmonary:     Effort: Pulmonary effort is normal.     Breath sounds: Normal breath sounds.  Abdominal:     General: Abdomen is protuberant. Bowel sounds are normal. There is no distension.     Palpations: Abdomen is soft.     Tenderness: There is no abdominal tenderness.  Musculoskeletal:     Right lower leg: No edema.     Left lower leg: Edema present.     Comments: Trace to +1 pitting left ankle, pretibial edema  No palpable cord felt in left calf/popliteal area  Skin:    General: Skin is warm and dry.     Capillary Refill: Capillary refill takes less than 2 seconds.  Neurological:     General: No focal  deficit present.     Mental Status: He is alert and oriented to person, place, and time.      Labs on Admission: I have personally reviewed following labs and imaging studies  CBC: Recent Labs  Lab 12/19/22 1410 12/20/22 1811  WBC 6.3 6.2  NEUTROABS 4.3 4.1  HGB 17.0 16.3  HCT 48.7 46.9  MCV 87.0 85.9  PLT  215.0 132   Basic Metabolic Panel: Recent Labs  Lab 12/19/22 1410 12/20/22 1811  NA 139 137  K 4.9 4.5  CL 102 99  CO2 27 27  GLUCOSE 157* 157*  BUN 16 16  CREATININE 0.86 1.03  CALCIUM 10.2 9.3   GFR: Estimated Creatinine Clearance: 70.9 mL/min (by C-G formula based on SCr of 1.03 mg/dL). Liver Function Tests: Recent Labs  Lab 12/19/22 1410 12/20/22 1811  AST 15 20  ALT 20 22  ALKPHOS 123* 94  BILITOT 0.6 0.9  PROT 7.4 7.5  ALBUMIN 4.6 3.9   No results for input(s): "LIPASE", "AMYLASE" in the last 168 hours. No results for input(s): "AMMONIA" in the last 168 hours. Coagulation Profile: Recent Labs  Lab 12/20/22 1811  INR 1.0   Cardiac Enzymes: Recent Labs  Lab 12/20/22 1811  TROPONINIHS 10   BNP (last 3 results) No results for input(s): "PROBNP" in the last 8760 hours. HbA1C: No results for input(s): "HGBA1C" in the last 72 hours. CBG: No results for input(s): "GLUCAP" in the last 168 hours. Lipid Profile: No results for input(s): "CHOL", "HDL", "LDLCALC", "TRIG", "CHOLHDL", "LDLDIRECT" in the last 72 hours. Thyroid Function Tests: No results for input(s): "TSH", "T4TOTAL", "FREET4", "T3FREE", "THYROIDAB" in the last 72 hours. Anemia Panel: No results for input(s): "VITAMINB12", "FOLATE", "FERRITIN", "TIBC", "IRON", "RETICCTPCT" in the last 72 hours. Urine analysis:    Component Value Date/Time   BILIRUBINUR negative 07/19/2019 1454   PROTEINUR Positive (A) 07/19/2019 1454   UROBILINOGEN 0.2 07/19/2019 1454   NITRITE negative 07/19/2019 1454   LEUKOCYTESUR Negative 07/19/2019 1454    Radiological Exams on Admission: I have  personally reviewed images CT Angio Chest W/Cm &/Or Wo Cm  Result Date: 12/20/2022 CLINICAL DATA:  Shortness of breath for 2 weeks.  Elevated D-dimer. EXAM: CT ANGIOGRAPHY CHEST WITH CONTRAST TECHNIQUE: Multidetector CT imaging of the chest was performed using the standard protocol during bolus administration of intravenous contrast. Multiplanar CT image reconstructions and MIPs were obtained to evaluate the vascular anatomy. RADIATION DOSE REDUCTION: This exam was performed according to the departmental dose-optimization program which includes automated exposure control, adjustment of the mA and/or kV according to patient size and/or use of iterative reconstruction technique. CONTRAST:  59m OMNIPAQUE IOHEXOL 350 MG/ML SOLN COMPARISON:  Chest x-ray 2006 report. FINDINGS: Cardiovascular: The heart is nonenlarged. Trace pericardial effusion. The thoracic aorta has a overall normal course and caliber with minimal atherosclerotic changes. More prominent calcified plaque along the proximal vertebral arteries at the edge of the imaging field and along the coronary arteries. Significant pulmonary emboli identified including a saddle embolus across the bifurcation of the main pulmonary artery. Extensive clot burden in the right lower lobe with poor enhancement. Clot also seen extending along the lobar pulmonary artery of the middle lobe and segmental in the right upper lobe. On the left there some central areas of thrombus greatest in the left upper lobe pulmonary artery. No shift of the ventricular septum to suggest right heart strain but there is some enlargement of the pulmonary artery. Please correlate for any clinical evidence of right heart strain. Mediastinum/Nodes: No specific abnormal lymph node enlargement identified in the axillary region, hilum or mediastinum. Moderate hiatal hernia. Normal caliber thoracic esophagus. Lungs/Pleura: Right lower lobe nodule identified in the costophrenic angle. On axial series  6 image 130 this measures 2.1 by 1.6 cm and is noncalcified. Elsewhere there is no consolidation, pneumothorax or effusion. Mild linear opacity seen elsewhere along the lung bases likely  scar or atelectasis. Few punctate nodules identified in the middle lobe such as series 6, image 84, under 3 mm. Slightly larger focus on image 92 in the middle lobe measuring 6 x 3 mm. Upper Abdomen: In the upper abdomen the adrenal glands are preserved. There is a right renal lesion at the edge of the imaging field with diameter of 5.2 cm. This has Hounsfield unit of 4 consistent with cyst but is incompletely included in the imaging field recommend dedicated workup when appropriate to confirm a benign cyst such as ultrasound. Please correlate with any prior. Musculoskeletal: Scattered degenerative changes along the spine. Review of the MIP images confirms the above findings. IMPRESSION: Large pulmonary embolism identified with saddle component. Significant areas of thrombus with poor enhancement particularly of the right lower lobe and left upper lobe. There is some enlargement of the pulmonary artery but no clear intraventricular septal shift. Please correlate for any clinical evidence of right heart strain. Noncalcified 2.1 cm right lower lobe lung nodule. Please as well correlate with any prior study to assess for stability. Consider one of the following in 3 months for both low-risk and high-risk individuals: (a) repeat chest CT, (b) follow-up PET-CT, or (c) tissue sampling. This recommendation follows the consensus statement: Guidelines for Management of Incidental Pulmonary Nodules Detected on CT Images: From the Fleischner Society 2017; Radiology 2017; 284:228-243. Moderate hiatal hernia. Probable right renal cyst at the edge of the imaging field but this lesion is not completely included in the imaging field of this chest exam and would recommend further workup when appropriate if there is no prior such as a renal  ultrasound. Critical Value/emergent results were called by telephone at the time of interpretation on 12/20/2022 at 2:46 pm to provider [facility] , who verbally acknowledged these results. Aortic Atherosclerosis (ICD10-I70.0). Electronically Signed   By: Jill Side M.D.   On: 12/20/2022 15:10    EKG: My personal interpretation of EKG shows: no EKG  Assessment/Plan Principal Problem:   Acute saddle pulmonary embolism (HCC) Active Problems:   Obesity (BMI 30-39.9)   Hyperglycemia    Assessment and Plan: * Acute saddle pulmonary embolism (HCC) Observation tele bed. Start IV heparin. Check echo, LE U/S. PE likely from long car ride in November 2023. Pt with left calf pain and swelling for about 4 weeks and SOB for last 1-2 weeks. No other risk factors. Will need anticoagulation for 12 months given size of embolism.  Hyperglycemia No prior hx of DM. Serum glucose >150. Will check A1c.  Obesity (BMI 30-39.9) BM 32   DVT prophylaxis: IV heparin gtts Code Status: Full Code Family Communication: discussed with pt and wife Glaine at bedside  Disposition Plan: return home  Consults called: EDP discussed case with PCCM  Admission status: Observation, Telemetry bed   [name], DO [facility] 12/20/2022, 7:59 PM

## 2022-12-20 NOTE — Telephone Encounter (Signed)
See other encounter 1/23

## 2022-12-20 NOTE — Assessment & Plan Note (Signed)
BM 32

## 2022-12-20 NOTE — Telephone Encounter (Signed)
LVM for pt to return call. Dr.McGowen ordered imaging to be done at [facility]. Someone left him a message yesterday to call back to schedule.  Their contact number is [phone]. He needs to contact them as soon as possible.

## 2022-12-20 NOTE — Assessment & Plan Note (Signed)
No prior hx of DM. Serum glucose >150. Will check A1c.

## 2022-12-20 NOTE — Assessment & Plan Note (Signed)
Observation tele bed. Start IV heparin. Check echo, LE U/S. PE likely from long car ride in November 2023. Pt with left calf pain and swelling for about 4 weeks and SOB for last 1-2 weeks. No other risk factors. Will need anticoagulation for 12 months given size of embolism.

## 2022-12-20 NOTE — Progress Notes (Signed)
Case reviewed with Dr. Pearline Cables.  Got CTA of chest for sob and breathing trouble x 2-3 weeks (see Dr. Idelle Leech note earlier today).   Imaging reveals saddle PE with no signs of RV strain.  Vitals benign.  Should be okay for observation with heparin, BR x 24h then transition to NoAC.  Would check echo for further risk stratification.  Please reach out if echo showing McConnell's or clot in transit.  No indication for advanced therapies at this time.  PESI 86 (class III intermediate).  Erskine Emery MD PCCM

## 2022-12-20 NOTE — Progress Notes (Signed)
ANTICOAGULATION CONSULT NOTE - Initial Consult  Pharmacy Consult for IV heparin Indication: pulmonary embolus  No Known Allergies  Patient Measurements: Height: '5\' 9"'$  (175.3 cm) Weight: 99.1 kg (218 lb 6.4 oz) IBW/kg (Calculated) : 70.7 Heparin Dosing Weight: 91.6 kg  Vital Signs: Temp: 97.6 F (36.4 C) (01/23 1640) Temp Source: Oral (01/23 1640) BP: 165/98 (01/23 1900) Pulse Rate: 77 (01/23 1900)  Labs: Recent Labs    12/19/22 1410 12/20/22 1811  HGB 17.0 16.3  HCT 48.7 46.9  PLT 215.0 195  LABPROT  --  13.2  INR  --  1.0  CREATININE 0.86 1.03  TROPONINIHS  --  10    Estimated Creatinine Clearance: 70.9 mL/min (by C-G formula based on SCr of 1.03 mg/dL).   Medical History: Past Medical History:  Diagnosis Date   DM (diabetes mellitus), type 2 ([place]) 03/2016   HbA1c 6.2% on 01/18/17.  Initially dx'd by fasting glucose criteria.   Elevated PSA    MRI prostate was done 2016 to eval persistently rising PSAs (no biopsy had been done): this showed no sign of prostate ca.  Stable 07/2017, 01/2018, 01/2019, 12/2019, 05/2020.   Encounter for hepatitis C virus screening test for high risk patient    Done by [facility] physicians 01/18/17   Essential hypertension    pt has declined to take med in past (as of 12/2016)   GERD (gastroesophageal reflux disease)    Hypercholesterolemia    Lumbar spondylosis    MRI L spine 2005: severe facet deg, SI deg   Obesity, Class II, BMI 35-39.9    OSA on CPAP    compliant   Prostate nodule 2016   prostate nodule or ridge in R prostate lobe--w/u reassuring 2016    Rhinitis    Tubular adenoma of colon 2014   recommend repeat colonoscopy 2019     Assessment: 76 y/oM with CTa chest + PE. Patient not on anticoagulants PTA. Baseline CBC, PT/INR, aPTT WNL. Pharmacy consulted for IV heparin dosing.   Goal of Therapy:  Heparin level 0.3-0.7 units/ml Monitor platelets by anticoagulation protocol: Yes   Plan:  Heparin 5000 units IV bolus x 1,  then start heparin infusion at 1500 units/hr Heparin level 8 hours after initiation Daily CBC, heparin level Monitor closely for s/sx of bleeding   Lindell Spar, PharmD, BCPS Clinical Pharmacist 12/20/2022,7:39 PM

## 2022-12-20 NOTE — Subjective & Objective (Signed)
CC: SOB, saddle PE on CT HPI: 77 yo WM without significant medical history presents to ER from outpatient CT with saddle PE.  Pt with 1-2 weeks with dyspnea. Especially on exertion. Pt is a avid Doctor, general practice. Pt noted increasing dyspnea when playing pickleball. No chest pain.  Pt remember his left calf being painful for 2-3 days but persistently swollen for the last 4 weeks. Pt states he and wife drove to [place] in November. Spent 8 hours in the car. Took limited breaks.  Went to PCP office for evaluation of exertional dyspnea. D-dimer was elevated. CTPA performed as outpatient that showed saddle PE. Pt sent to ER for evaluation.  On arrival temp 97.6 heart rate 83 blood pressure 189/108 satting 95% on room air.  Labs showed white count 6.2, hemoglobin 16.3, platelets 185  CMP unremarkable except for serum glucose 157  CTPA reviewed from today which demonstrates saddle PE.  There is also a significant burden in the right lower lobe.  Right middle lobe and segmental in the right upper lobe.  [facility] contacted for admission.

## 2022-12-20 NOTE — Telephone Encounter (Signed)
Radiology called first time to speak with Dr.McGowen emergently regarding CT scan. Radiology called again to see if patient needs to continue with DVT or go to ER. Provider advised that patient go straight to ER ([facility] or [facility]). Provider will communicate with patient for any further recommendations.

## 2022-12-21 ENCOUNTER — Encounter (HOSPITAL_COMMUNITY): Payer: Self-pay | Admitting: Internal Medicine

## 2022-12-21 ENCOUNTER — Observation Stay (HOSPITAL_COMMUNITY): Payer: PPO

## 2022-12-21 DIAGNOSIS — M7989 Other specified soft tissue disorders: Secondary | ICD-10-CM | POA: Diagnosis not present

## 2022-12-21 DIAGNOSIS — I2602 Saddle embolus of pulmonary artery with acute cor pulmonale: Secondary | ICD-10-CM | POA: Diagnosis not present

## 2022-12-21 DIAGNOSIS — Z6831 Body mass index (BMI) 31.0-31.9, adult: Secondary | ICD-10-CM | POA: Diagnosis not present

## 2022-12-21 DIAGNOSIS — I10 Essential (primary) hypertension: Secondary | ICD-10-CM | POA: Diagnosis present

## 2022-12-21 DIAGNOSIS — Z811 Family history of alcohol abuse and dependence: Secondary | ICD-10-CM | POA: Diagnosis not present

## 2022-12-21 DIAGNOSIS — G4733 Obstructive sleep apnea (adult) (pediatric): Secondary | ICD-10-CM | POA: Diagnosis present

## 2022-12-21 DIAGNOSIS — E669 Obesity, unspecified: Secondary | ICD-10-CM | POA: Diagnosis present

## 2022-12-21 DIAGNOSIS — I2692 Saddle embolus of pulmonary artery without acute cor pulmonale: Secondary | ICD-10-CM | POA: Diagnosis present

## 2022-12-21 DIAGNOSIS — Z79899 Other long term (current) drug therapy: Secondary | ICD-10-CM | POA: Diagnosis not present

## 2022-12-21 DIAGNOSIS — Z8249 Family history of ischemic heart disease and other diseases of the circulatory system: Secondary | ICD-10-CM | POA: Diagnosis not present

## 2022-12-21 DIAGNOSIS — E7800 Pure hypercholesterolemia, unspecified: Secondary | ICD-10-CM | POA: Diagnosis present

## 2022-12-21 DIAGNOSIS — Z87891 Personal history of nicotine dependence: Secondary | ICD-10-CM | POA: Diagnosis not present

## 2022-12-21 DIAGNOSIS — I2699 Other pulmonary embolism without acute cor pulmonale: Secondary | ICD-10-CM | POA: Diagnosis not present

## 2022-12-21 DIAGNOSIS — K219 Gastro-esophageal reflux disease without esophagitis: Secondary | ICD-10-CM | POA: Diagnosis present

## 2022-12-21 DIAGNOSIS — E1165 Type 2 diabetes mellitus with hyperglycemia: Secondary | ICD-10-CM | POA: Diagnosis present

## 2022-12-21 DIAGNOSIS — R739 Hyperglycemia, unspecified: Secondary | ICD-10-CM | POA: Diagnosis not present

## 2022-12-21 LAB — CBC
HCT: 44.7 % (ref 39.0–52.0)
Hemoglobin: 15.5 g/dL (ref 13.0–17.0)
MCH: 30 pg (ref 26.0–34.0)
MCHC: 34.7 g/dL (ref 30.0–36.0)
MCV: 86.5 fL (ref 80.0–100.0)
Platelets: 184 10*3/uL (ref 150–400)
RBC: 5.17 MIL/uL (ref 4.22–5.81)
RDW: 13 % (ref 11.5–15.5)
WBC: 5.1 10*3/uL (ref 4.0–10.5)
nRBC: 0 % (ref 0.0–0.2)

## 2022-12-21 LAB — HEPARIN LEVEL (UNFRACTIONATED)
Heparin Unfractionated: 0.47 IU/mL (ref 0.30–0.70)
Heparin Unfractionated: 0.47 IU/mL (ref 0.30–0.70)

## 2022-12-21 NOTE — Plan of Care (Signed)

## 2022-12-21 NOTE — Progress Notes (Signed)
ANTICOAGULATION CONSULT NOTE - Follow Up Consult  Pharmacy Consult for Heparin Indication: DVT, saddle PE  No Known Allergies  Patient Measurements: Height: '5\' 9"'$  (175.3 cm) Weight: 97.4 kg (214 lb 11.2 oz) IBW/kg (Calculated) : 70.7 Heparin Dosing Weight: 91.1 kg  Vital Signs: Temp: 97.4 F (36.3 C) (01/24 1310) Temp Source: Oral (01/24 1310) BP: 160/91 (01/24 1310) Pulse Rate: 83 (01/24 1310)  Labs: Recent Labs    12/19/22 1410 12/20/22 1811 12/20/22 2106 12/21/22 0512 12/21/22 1257  HGB 17.0 16.3  --  15.5  --   HCT 48.7 46.9  --  44.7  --   PLT 215.0 195  --  184  --   APTT  --  26  --   --   --   LABPROT  --  13.2  --   --   --   INR  --  1.0  --   --   --   HEPARINUNFRC  --   --   --  0.47 0.47  CREATININE 0.86 1.03  --   --   --   TROPONINIHS  --  10 10  --   --     Estimated Creatinine Clearance: 70.2 mL/min (by C-G formula based on SCr of 1.03 mg/dL).  Assessment: AC/Heme: left DVT, saddle PE. Hep level 0.47 in goal x 2. CBC WNL  Goal of Therapy:  Heparin level 0.3-0.7 units/ml Monitor platelets by anticoagulation protocol: Yes   Plan:  Con't IV heparin at 1500 units/hr Daily HL and CBC   Emanuel Campos S. Alford Highland, PharmD, BCPS Clinical Staff Pharmacist [email or web address] Alford Highland, [employer] 12/21/2022,2:17 PM

## 2022-12-21 NOTE — Progress Notes (Signed)
Mobility Specialist - Progress Note   12/21/22 1453  Mobility  Activity Ambulated with assistance in hallway  Level of Assistance Independent after set-up  Assistive Device Other (Comment) (IV Pole)  Distance Ambulated (ft) 480 ft  Activity Response Tolerated well  Mobility Referral Yes  $Mobility charge 1 Mobility   Pt received in recliner and agreeable to mobility. No complaints during session. Pt to recliner after session with all needs met.   [facility]

## 2022-12-21 NOTE — Progress Notes (Signed)
Mobility Specialist - Progress Note   12/21/22 1030  Mobility  Activity Ambulated with assistance in hallway  Level of Assistance Independent after set-up  Assistive Device Other (Comment) (IV Pole)  Distance Ambulated (ft) 480 ft  Activity Response Tolerated well  Mobility Referral Yes  $Mobility charge 1 Mobility   Pt received in bed and agreeable to mobility. No complaints during session. Pt to bed after session with all needs met.    [facility]

## 2022-12-21 NOTE — Progress Notes (Signed)
BLE venous duplex has been completed.  Preliminary results given to Abby, RN.   Results can be found under chart review under CV PROC. 12/21/2022 10:10 AM Salihah Peckham RVT, RDMS

## 2022-12-21 NOTE — Progress Notes (Signed)
ANTICOAGULATION CONSULT NOTE - follow up  Pharmacy Consult for IV heparin Indication: pulmonary embolus  No Known Allergies  Patient Measurements: Height: '5\' 9"'$  (175.3 cm) Weight: 97.4 kg (214 lb 11.2 oz) IBW/kg (Calculated) : 70.7 Heparin Dosing Weight: 91.6 kg  Vital Signs: Temp: 98 F (36.7 C) (01/24 0355) Temp Source: Oral (01/24 0355) BP: 155/91 (01/24 0355) Pulse Rate: 77 (01/24 0355)  Labs: Recent Labs    12/19/22 1410 12/20/22 1811 12/20/22 2106 12/21/22 0512  HGB 17.0 16.3  --  15.5  HCT 48.7 46.9  --  44.7  PLT 215.0 195  --  184  APTT  --  26  --   --   LABPROT  --  13.2  --   --   INR  --  1.0  --   --   HEPARINUNFRC  --   --   --  0.47  CREATININE 0.86 1.03  --   --   TROPONINIHS  --  10 10  --      Estimated Creatinine Clearance: 70.2 mL/min (by C-G formula based on SCr of 1.03 mg/dL).   Medical History: Past Medical History:  Diagnosis Date   Elevated PSA    MRI prostate was done 2016 to eval persistently rising PSAs (no biopsy had been done): this showed no sign of prostate ca.  Stable 07/2017, 01/2018, 01/2019, 12/2019, 05/2020.   Encounter for hepatitis C virus screening test for high risk patient    Done by [facility] physicians 01/18/17   Hypercholesterolemia    Lumbar spondylosis    MRI L spine 2005: severe facet deg, SI deg   Obesity, Class II, BMI 35-39.9    OSA on CPAP    compliant   Prostate nodule 2016   prostate nodule or ridge in R prostate lobe--w/u reassuring 2016    Rhinitis    Tubular adenoma of colon 2014   recommend repeat colonoscopy 2019     Assessment: 76 y/oM with CTa chest + PE. Patient not on anticoagulants PTA. Baseline CBC, PT/INR, aPTT WNL. Pharmacy consulted for IV heparin dosing.   12/21/2022 HL 0.47 therapeutic on 1500 units/hr CBC WNL No bleeding noted   Goal of Therapy:  Heparin level 0.3-0.7 units/ml Monitor platelets by anticoagulation protocol: Yes   Plan:  Continue heparin infusion at 1500  units/hr Confirmatory Heparin level in 8 hours  Daily CBC, heparin level Monitor closely for s/sx of bleeding  Dolly Rias RPh 12/21/2022, 5:59 AM

## 2022-12-21 NOTE — Progress Notes (Signed)
[name]    Progress Note  [name]  [name]:[id number] DOB: [birth year] DOA: 12/20/2022 PCP: [name], MD     Brief Narrative:   Wayne Lamb is an 77 y.o. male no apparent significant medical history comes into the ED from an outpatient facility due to a saddle PE, according to the patient he has been experience dyspnea with exertion about 2 weeks prior to admission, he also remembers left calf pain went to his PCP D-dimer was elevated CTA was performed that showed saddle PE in the ED heart rate in the 80s blood pressure 189/100 satting 95% on room air   Assessment/Plan:   Acute saddle pulmonary embolism ([place]) Started on IV heparin, 2D echo pending lower extremity Doppler pending. Vitals are stable satting greater 94% on room air. Continue IV heparin.   Hyperglycemia: Likely reactive, A1c pending.  Obesity (BMI 30-39.9) Noted DVT prophylaxis: heparin Family Communication:none Status is: Observation The patient remains OBS appropriate and will d/c before 2 midnights.    Code Status:     Code Status Orders  (From admission, onward)           Start     Ordered   12/20/22 2003  Full code  Continuous       Question:  By:  Answer:  Consent: discussion documented in EHR   12/20/22 2003           Code Status History     Date Active Date Inactive Code Status Order ID Comments User Context   12/20/2022 1935 12/20/2022 2003 Full Code [id number]  [name], DO ED         IV Access:   Peripheral IV   Procedures and diagnostic studies:   CT Angio Chest W/Cm &/Or Wo Cm  Result Date: 12/20/2022 CLINICAL DATA:  Shortness of breath for 2 weeks.  Elevated D-dimer. EXAM: CT ANGIOGRAPHY CHEST WITH CONTRAST TECHNIQUE: Multidetector CT imaging of the chest was performed using the standard protocol during bolus administration of intravenous contrast. Multiplanar CT image reconstructions and MIPs were obtained to evaluate the vascular  anatomy. RADIATION DOSE REDUCTION: This exam was performed according to the departmental dose-optimization program which includes automated exposure control, adjustment of the mA and/or kV according to patient size and/or use of iterative reconstruction technique. CONTRAST:  40m OMNIPAQUE IOHEXOL 350 MG/ML SOLN COMPARISON:  Chest x-ray 2006 report. FINDINGS: Cardiovascular: The heart is nonenlarged. Trace pericardial effusion. The thoracic aorta has a overall normal course and caliber with minimal atherosclerotic changes. More prominent calcified plaque along the proximal vertebral arteries at the edge of the imaging field and along the coronary arteries. Significant pulmonary emboli identified including a saddle embolus across the bifurcation of the main pulmonary artery. Extensive clot burden in the right lower lobe with poor enhancement. Clot also seen extending along the lobar pulmonary artery of the middle lobe and segmental in the right upper lobe. On the left there some central areas of thrombus greatest in the left upper lobe pulmonary artery. No shift of the ventricular septum to suggest right heart strain but there is some enlargement of the pulmonary artery. Please correlate for any clinical evidence of right heart strain. Mediastinum/Nodes: No specific abnormal lymph node enlargement identified in the axillary region, hilum or mediastinum. Moderate hiatal hernia. Normal caliber thoracic esophagus. Lungs/Pleura: Right lower lobe nodule identified in the costophrenic angle. On axial series 6 image 130 this measures 2.1 by 1.6 cm and is noncalcified. Elsewhere there is no  consolidation, pneumothorax or effusion. Mild linear opacity seen elsewhere along the lung bases likely scar or atelectasis. Few punctate nodules identified in the middle lobe such as series 6, image 84, under 3 mm. Slightly larger focus on image 92 in the middle lobe measuring 6 x 3 mm. Upper Abdomen: In the upper abdomen the adrenal  glands are preserved. There is a right renal lesion at the edge of the imaging field with diameter of 5.2 cm. This has Hounsfield unit of 4 consistent with cyst but is incompletely included in the imaging field recommend dedicated workup when appropriate to confirm a benign cyst such as ultrasound. Please correlate with any prior. Musculoskeletal: Scattered degenerative changes along the spine. Review of the MIP images confirms the above findings. IMPRESSION: Large pulmonary embolism identified with saddle component. Significant areas of thrombus with poor enhancement particularly of the right lower lobe and left upper lobe. There is some enlargement of the pulmonary artery but no clear intraventricular septal shift. Please correlate for any clinical evidence of right heart strain. Noncalcified 2.1 cm right lower lobe lung nodule. Please as well correlate with any prior study to assess for stability. Consider one of the following in 3 months for both low-risk and high-risk individuals: (a) repeat chest CT, (b) follow-up PET-CT, or (c) tissue sampling. This recommendation follows the consensus statement: Guidelines for Management of Incidental Pulmonary Nodules Detected on CT Images: From the Fleischner Society 2017; Radiology 2017; 284:228-243. Moderate hiatal hernia. Probable right renal cyst at the edge of the imaging field but this lesion is not completely included in the imaging field of this chest exam and would recommend further workup when appropriate if there is no prior such as a renal ultrasound. Critical Value/emergent results were called by telephone at the time of interpretation on 12/20/2022 at 2:46 pm to provider [facility] , who verbally acknowledged these results. Aortic Atherosclerosis (ICD10-I70.0). Electronically Signed   By: Jill Side M.D.   On: 12/20/2022 15:10     Medical Consultants:   None.   Subjective:    [name] feels great no complaints wants to go  back.  Objective:    Vitals:   12/20/22 2029 12/20/22 2043 12/20/22 2330 12/21/22 0355  BP: (!) 166/107  (!) 148/87 (!) 155/91  Pulse: 79  72 77  Resp: '20  20 20  '$ Temp: 97.9 F (36.6 C)  98.5 F (36.9 C) 98 F (36.7 C)  TempSrc: Oral  Oral Oral  SpO2: 94%  94% 96%  Weight:  97.4 kg    Height:  '5\' 9"'$  (1.753 m)     SpO2: 96 %   Intake/Output Summary (Last 24 hours) at 12/21/2022 0709 Last data filed at 12/21/2022 0400 Gross per 24 hour  Intake 273.14 ml  Output --  Net 273.14 ml   Filed Weights   12/20/22 1640 12/20/22 2043  Weight: 99.1 kg 97.4 kg    Exam: General exam: In no acute distress. Respiratory system: Good air movement and clear to auscultation. Cardiovascular system: S1 & S2 heard, RRR. No JVD. Gastrointestinal system: Abdomen is nondistended, soft and nontender.  Extremities: No pedal edema. Skin: No rashes, lesions or ulcers Psychiatry: Judgement and insight appear normal. Mood & affect appropriate.    Data Reviewed:    Labs: Basic Metabolic Panel: Recent Labs  Lab 12/19/22 1410 12/20/22 1811  NA 139 137  K 4.9 4.5  CL 102 99  CO2 27 27  GLUCOSE 157* 157*  BUN 16 16  CREATININE 0.86 1.03  CALCIUM 10.2 9.3   GFR Estimated Creatinine Clearance: 70.2 mL/min (by C-G formula based on SCr of 1.03 mg/dL). Liver Function Tests: Recent Labs  Lab 12/19/22 1410 12/20/22 1811  AST 15 20  ALT 20 22  ALKPHOS 123* 94  BILITOT 0.6 0.9  PROT 7.4 7.5  ALBUMIN 4.6 3.9   No results for input(s): "LIPASE", "AMYLASE" in the last 168 hours. No results for input(s): "AMMONIA" in the last 168 hours. Coagulation profile Recent Labs  Lab 12/20/22 1811  INR 1.0   COVID-19 Labs  Recent Labs    12/19/22 1410  DDIMER 3.46*    Lab Results  Component Value Date   SARSCOV2NAA Not Detected 10/23/2019    CBC: Recent Labs  Lab 12/19/22 1410 12/20/22 1811 12/21/22 0512  WBC 6.3 6.2 5.1  NEUTROABS 4.3 4.1  --   HGB 17.0 16.3 15.5  HCT 48.7  46.9 44.7  MCV 87.0 85.9 86.5  PLT 215.0 195 184   Cardiac Enzymes: No results for input(s): "CKTOTAL", "CKMB", "CKMBINDEX", "TROPONINI" in the last 168 hours. BNP (last 3 results) No results for input(s): "PROBNP" in the last 8760 hours. CBG: No results for input(s): "GLUCAP" in the last 168 hours. D-Dimer: Recent Labs    12/19/22 1410  DDIMER 3.46*   Hgb A1c: No results for input(s): "HGBA1C" in the last 72 hours. Lipid Profile: No results for input(s): "CHOL", "HDL", "LDLCALC", "TRIG", "CHOLHDL", "LDLDIRECT" in the last 72 hours. Thyroid function studies: No results for input(s): "TSH", "T4TOTAL", "T3FREE", "THYROIDAB" in the last 72 hours.  Invalid input(s): "FREET3" Anemia work up: No results for input(s): "VITAMINB12", "FOLATE", "FERRITIN", "TIBC", "IRON", "RETICCTPCT" in the last 72 hours. Sepsis Labs: Recent Labs  Lab 12/19/22 1410 12/20/22 1811 12/21/22 0512  WBC 6.3 6.2 5.1   Microbiology No results found for this or any previous visit (from the past 240 hour(s)).   Medications:    Continuous Infusions:  heparin 1,500 Units/hr (12/20/22 2103)      LOS: 0 days   Charlynne Cousins  [facility]  12/21/2022, 7:09 AM

## 2022-12-22 ENCOUNTER — Inpatient Hospital Stay (HOSPITAL_COMMUNITY): Payer: PPO

## 2022-12-22 ENCOUNTER — Other Ambulatory Visit (HOSPITAL_COMMUNITY): Payer: Self-pay

## 2022-12-22 ENCOUNTER — Encounter: Payer: Self-pay | Admitting: Family Medicine

## 2022-12-22 DIAGNOSIS — I2602 Saddle embolus of pulmonary artery with acute cor pulmonale: Secondary | ICD-10-CM

## 2022-12-22 DIAGNOSIS — I2692 Saddle embolus of pulmonary artery without acute cor pulmonale: Secondary | ICD-10-CM | POA: Diagnosis not present

## 2022-12-22 DIAGNOSIS — R739 Hyperglycemia, unspecified: Secondary | ICD-10-CM

## 2022-12-22 LAB — ECHOCARDIOGRAM COMPLETE
Area-P 1/2: 2.32 cm2
Height: 69 in
MV VTI: 1.6 cm2
S' Lateral: 3.6 cm
Weight: 3442.7 oz

## 2022-12-22 LAB — CBC
HCT: 45.6 % (ref 39.0–52.0)
Hemoglobin: 15.8 g/dL (ref 13.0–17.0)
MCH: 30 pg (ref 26.0–34.0)
MCHC: 34.6 g/dL (ref 30.0–36.0)
MCV: 86.7 fL (ref 80.0–100.0)
Platelets: 191 10*3/uL (ref 150–400)
RBC: 5.26 MIL/uL (ref 4.22–5.81)
RDW: 12.9 % (ref 11.5–15.5)
WBC: 5.1 10*3/uL (ref 4.0–10.5)
nRBC: 0 % (ref 0.0–0.2)

## 2022-12-22 LAB — HEPARIN LEVEL (UNFRACTIONATED): Heparin Unfractionated: 0.49 IU/mL (ref 0.30–0.70)

## 2022-12-22 MED ORDER — PERFLUTREN LIPID MICROSPHERE
1.0000 mL | INTRAVENOUS | Status: AC | PRN
  Administered 2022-12-22: 2 mL via INTRAVENOUS

## 2022-12-22 NOTE — Progress Notes (Signed)
ANTICOAGULATION CONSULT NOTE - follow up  Pharmacy Consult for IV heparin Indication: pulmonary embolus  No Known Allergies  Patient Measurements: Height: '5\' 9"'$  (175.3 cm) Weight: 97.6 kg (215 lb 2.7 oz) IBW/kg (Calculated) : 70.7 Heparin Dosing Weight: 91.6 kg  Vital Signs: Temp: 98.1 F (36.7 C) (01/25 0450) Temp Source: Oral (01/25 0450) BP: 145/80 (01/25 0450) Pulse Rate: 66 (01/25 0450)  Labs: Recent Labs    12/19/22 1410 12/20/22 1811 12/20/22 2106 12/21/22 0512 12/21/22 1257 12/22/22 0455  HGB 17.0 16.3  --  15.5  --  15.8  HCT 48.7 46.9  --  44.7  --  45.6  PLT 215.0 195  --  184  --  191  APTT  --  26  --   --   --   --   LABPROT  --  13.2  --   --   --   --   INR  --  1.0  --   --   --   --   HEPARINUNFRC  --   --   --  0.47 0.47 0.49  CREATININE 0.86 1.03  --   --   --   --   TROPONINIHS  --  10 10  --   --   --      Estimated Creatinine Clearance: 70.3 mL/min (by C-G formula based on SCr of 1.03 mg/dL).   Medical History: Past Medical History:  Diagnosis Date   Elevated PSA    MRI prostate was done 2016 to eval persistently rising PSAs (no biopsy had been done): this showed no sign of prostate ca.  Stable 07/2017, 01/2018, 01/2019, 12/2019, 05/2020.   Encounter for hepatitis C virus screening test for high risk patient    Done by [facility] physicians 01/18/17   Hypercholesterolemia    Lumbar spondylosis    MRI L spine 2005: severe facet deg, SI deg   Obesity, Class II, BMI 35-39.9    OSA on CPAP    compliant   Prostate nodule 2016   prostate nodule or ridge in R prostate lobe--w/u reassuring 2016    Rhinitis    Tubular adenoma of colon 2014   recommend repeat colonoscopy 2019     Assessment: 76 y/oM with CTa chest + PE. Patient not on anticoagulants PTA. Baseline CBC, PT/INR, aPTT WNL. Pharmacy consulted for IV heparin dosing.   12/22/2022 HL 0.49 therapeutic on 1500 units/hr CBC WNL No bleeding noted   Goal of Therapy:  Heparin level  0.3-0.7 units/ml Monitor platelets by anticoagulation protocol: Yes   Plan:  Continue heparin infusion at 1500 units/hr Daily CBC, heparin level Monitor closely for s/sx of bleeding  Dolly Rias RPh 12/22/2022, 5:31 AM

## 2022-12-22 NOTE — Progress Notes (Signed)
[name]    Progress Note  Wayne Lamb  ZOX:[id number] DOB: [birth year] DOA: 12/20/2022 PCP: [name], MD     Brief Narrative:   Wayne Lamb is an 77 y.o. male no apparent significant medical history comes into the ED from an outpatient facility due to a saddle PE, according to the patient he has been experience dyspnea with exertion about 2 weeks prior to admission, he also remembers left calf pain went to his PCP D-dimer was elevated CTA was performed that showed saddle PE in the ED heart rate in the 80s blood pressure 189/100 satting 95% on room air   Assessment/Plan:   Acute saddle pulmonary embolism (HCC) Continue IV heparin, 2D echo pending lower extremity Doppler left lower DVT Vitals are stable satting greater 94% on room air. Continue IV heparin.   Hyperglycemia: Likely reactive blood glucose improved.  Obesity (BMI 30-39.9) Noted DVT prophylaxis: heparin Family Communication:none Status is: Observation The patient remains OBS appropriate and will d/c before 2 midnights.    Code Status:     Code Status Orders  (From admission, onward)           Start     Ordered   12/20/22 2003  Full code  Continuous       Question:  By:  Answer:  Consent: discussion documented in EHR   12/20/22 2003           Code Status History     Date Active Date Inactive Code Status Order ID Comments User Context   12/20/2022 1935 12/20/2022 2003 Full Code [id number]  [name], DO ED         IV Access:   Peripheral IV   Procedures and diagnostic studies:   VAS [place] LOWER EXTREMITY VENOUS (DVT)  Result Date: 12/21/2022  Lower Venous DVT Study Patient Name:  Wayne Lamb  Date of Exam:   12/21/2022 Medical Rec #: [id number]       Accession #:    [id number] Date of Birth: 1946-06-08       Patient Gender: M Patient Age:   37 years Exam Location:  [facility] Procedure:      VAS [place] LOWER EXTREMITY VENOUS (DVT) Referring Phys:  ERIC CHEN --------------------------------------------------------------------------------  Indications: Pulmonary embolism, and LLE swelling.  Performing Technologist: Rogelia Rohrer RVT, RDMS  Examination Guidelines: A complete evaluation includes B-mode imaging, spectral Doppler, color Doppler, and power Doppler as needed of all accessible portions of each vessel. Bilateral testing is considered an integral part of a complete examination. Limited examinations for reoccurring indications may be performed as noted. The reflux portion of the exam is performed with the patient in reverse Trendelenburg.  +---------+---------------+---------+-----------+----------+--------------+ RIGHT    CompressibilityPhasicitySpontaneityPropertiesThrombus Aging +---------+---------------+---------+-----------+----------+--------------+ CFV      Full           Yes      Yes                                 +---------+---------------+---------+-----------+----------+--------------+ SFJ      Full                                                        +---------+---------------+---------+-----------+----------+--------------+ FV Prox  Full  Yes      Yes                                 +---------+---------------+---------+-----------+----------+--------------+ FV Mid   Full           Yes      Yes                                 +---------+---------------+---------+-----------+----------+--------------+ FV DistalFull           Yes      Yes                                 +---------+---------------+---------+-----------+----------+--------------+ PFV      Full                                                        +---------+---------------+---------+-----------+----------+--------------+ POP      Full           Yes      Yes                                 +---------+---------------+---------+-----------+----------+--------------+ PTV      Full                                                         +---------+---------------+---------+-----------+----------+--------------+ PERO     Full                                                        +---------+---------------+---------+-----------+----------+--------------+   +---------+---------------+---------+-----------+----------+--------------+ LEFT     CompressibilityPhasicitySpontaneityPropertiesThrombus Aging +---------+---------------+---------+-----------+----------+--------------+ CFV      Partial        Yes      Yes                  Acute          +---------+---------------+---------+-----------+----------+--------------+ SFJ      Full                                                        +---------+---------------+---------+-----------+----------+--------------+ FV Prox  None           No       No                   Acute          +---------+---------------+---------+-----------+----------+--------------+ FV Mid   None           No       No  Acute          +---------+---------------+---------+-----------+----------+--------------+ FV DistalNone           No       No                   Acute          +---------+---------------+---------+-----------+----------+--------------+ PFV      Full                                                        +---------+---------------+---------+-----------+----------+--------------+ POP      None           No       No                   Acute          +---------+---------------+---------+-----------+----------+--------------+ PTV      None           No       No                   Acute          +---------+---------------+---------+-----------+----------+--------------+ PERO     None           No       No                   Acute          +---------+---------------+---------+-----------+----------+--------------+ Gastroc  None           No       No                   Acute           +---------+---------------+---------+-----------+----------+--------------+     Summary: RIGHT: - There is no evidence of deep vein thrombosis in the lower extremity.  LEFT: - Findings consistent with acute deep vein thrombosis involving the left common femoral vein, left femoral vein, left popliteal vein, left posterior tibial veins, left peroneal veins, and left gastrocnemius veins.  *See table(s) above for measurements and observations. Electronically signed by Harold Barban MD on 12/21/2022 at 8:55:03 PM.    Final    CT Angio Chest W/Cm &/Or Wo Cm  Result Date: 12/20/2022 CLINICAL DATA:  Shortness of breath for 2 weeks.  Elevated D-dimer. EXAM: CT ANGIOGRAPHY CHEST WITH CONTRAST TECHNIQUE: Multidetector CT imaging of the chest was performed using the standard protocol during bolus administration of intravenous contrast. Multiplanar CT image reconstructions and MIPs were obtained to evaluate the vascular anatomy. RADIATION DOSE REDUCTION: This exam was performed according to the departmental dose-optimization program which includes automated exposure control, adjustment of the mA and/or kV according to patient size and/or use of iterative reconstruction technique. CONTRAST:  27m OMNIPAQUE IOHEXOL 350 MG/ML SOLN COMPARISON:  Chest x-ray 2006 report. FINDINGS: Cardiovascular: The heart is nonenlarged. Trace pericardial effusion. The thoracic aorta has a overall normal course and caliber with minimal atherosclerotic changes. More prominent calcified plaque along the proximal vertebral arteries at the edge of the imaging field and along the coronary arteries. Significant pulmonary emboli identified including a saddle embolus across the bifurcation of the main pulmonary artery. Extensive clot burden in the right lower lobe with poor enhancement. Clot also seen extending along the lobar  pulmonary artery of the middle lobe and segmental in the right upper lobe. On the left there some central areas of thrombus  greatest in the left upper lobe pulmonary artery. No shift of the ventricular septum to suggest right heart strain but there is some enlargement of the pulmonary artery. Please correlate for any clinical evidence of right heart strain. Mediastinum/Nodes: No specific abnormal lymph node enlargement identified in the axillary region, hilum or mediastinum. Moderate hiatal hernia. Normal caliber thoracic esophagus. Lungs/Pleura: Right lower lobe nodule identified in the costophrenic angle. On axial series 6 image 130 this measures 2.1 by 1.6 cm and is noncalcified. Elsewhere there is no consolidation, pneumothorax or effusion. Mild linear opacity seen elsewhere along the lung bases likely scar or atelectasis. Few punctate nodules identified in the middle lobe such as series 6, image 84, under 3 mm. Slightly larger focus on image 92 in the middle lobe measuring 6 x 3 mm. Upper Abdomen: In the upper abdomen the adrenal glands are preserved. There is a right renal lesion at the edge of the imaging field with diameter of 5.2 cm. This has Hounsfield unit of 4 consistent with cyst but is incompletely included in the imaging field recommend dedicated workup when appropriate to confirm a benign cyst such as ultrasound. Please correlate with any prior. Musculoskeletal: Scattered degenerative changes along the spine. Review of the MIP images confirms the above findings. IMPRESSION: Large pulmonary embolism identified with saddle component. Significant areas of thrombus with poor enhancement particularly of the right lower lobe and left upper lobe. There is some enlargement of the pulmonary artery but no clear intraventricular septal shift. Please correlate for any clinical evidence of right heart strain. Noncalcified 2.1 cm right lower lobe lung nodule. Please as well correlate with any prior study to assess for stability. Consider one of the following in 3 months for both low-risk and high-risk individuals: (a) repeat chest CT,  (b) follow-up PET-CT, or (c) tissue sampling. This recommendation follows the consensus statement: Guidelines for Management of Incidental Pulmonary Nodules Detected on CT Images: From the Fleischner Society 2017; Radiology 2017; 284:228-243. Moderate hiatal hernia. Probable right renal cyst at the edge of the imaging field but this lesion is not completely included in the imaging field of this chest exam and would recommend further workup when appropriate if there is no prior such as a renal ultrasound. Critical Value/emergent results were called by telephone at the time of interpretation on 12/20/2022 at 2:46 pm to provider [facility] , who verbally acknowledged these results. Aortic Atherosclerosis (ICD10-I70.0). Electronically Signed   By: Jill Side M.D.   On: 12/20/2022 15:10     Medical Consultants:   None.   Subjective:    FRANSICO SCIANDRA feels good.  Objective:    Vitals:   12/21/22 0355 12/21/22 1310 12/21/22 2210 12/22/22 0450  BP: (!) 155/91 (!) 160/91 (!) 164/87 (!) 145/80  Pulse: 77 83 88 66  Resp: '20 18 19 17  '$ Temp: 98 F (36.7 C) (!) 97.4 F (36.3 C) 97.6 F (36.4 C) 98.1 F (36.7 C)  TempSrc: Oral Oral Oral Oral  SpO2: 96% 96% 97% 94%  Weight:    97.6 kg  Height:       SpO2: 94 %   Intake/Output Summary (Last 24 hours) at 12/22/2022 0840 Last data filed at 12/22/2022 0700 Gross per 24 hour  Intake 1241.67 ml  Output --  Net 1241.67 ml    Filed Weights   12/20/22 1640 12/20/22 2043  12/22/22 0450  Weight: 99.1 kg 97.4 kg 97.6 kg    Exam: General exam: In no acute distress. Respiratory system: Good air movement and clear to auscultation. Cardiovascular system: S1 & S2 heard, RRR. No JVD. Gastrointestinal system: Abdomen is nondistended, soft and nontender.  Extremities: No pedal edema. Skin: No rashes, lesions or ulcers Psychiatry: Judgement and insight appear normal. Mood & affect appropriate.   Data Reviewed:    Labs: Basic Metabolic  Panel: Recent Labs  Lab 12/19/22 1410 12/20/22 1811  NA 139 137  K 4.9 4.5  CL 102 99  CO2 27 27  GLUCOSE 157* 157*  BUN 16 16  CREATININE 0.86 1.03  CALCIUM 10.2 9.3    GFR Estimated Creatinine Clearance: 70.3 mL/min (by C-G formula based on SCr of 1.03 mg/dL). Liver Function Tests: Recent Labs  Lab 12/19/22 1410 12/20/22 1811  AST 15 20  ALT 20 22  ALKPHOS 123* 94  BILITOT 0.6 0.9  PROT 7.4 7.5  ALBUMIN 4.6 3.9    No results for input(s): "LIPASE", "AMYLASE" in the last 168 hours. No results for input(s): "AMMONIA" in the last 168 hours. Coagulation profile Recent Labs  Lab 12/20/22 1811  INR 1.0    COVID-19 Labs  Recent Labs    12/19/22 1410  DDIMER 3.46*     Lab Results  Component Value Date   SARSCOV2NAA Not Detected 10/23/2019    CBC: Recent Labs  Lab 12/19/22 1410 12/20/22 1811 12/21/22 0512 12/22/22 0455  WBC 6.3 6.2 5.1 5.1  NEUTROABS 4.3 4.1  --   --   HGB 17.0 16.3 15.5 15.8  HCT 48.7 46.9 44.7 45.6  MCV 87.0 85.9 86.5 86.7  PLT 215.0 195 184 191    Cardiac Enzymes: No results for input(s): "CKTOTAL", "CKMB", "CKMBINDEX", "TROPONINI" in the last 168 hours. BNP (last 3 results) No results for input(s): "PROBNP" in the last 8760 hours. CBG: No results for input(s): "GLUCAP" in the last 168 hours. D-Dimer: Recent Labs    12/19/22 1410  DDIMER 3.46*    Hgb A1c: No results for input(s): "HGBA1C" in the last 72 hours. Lipid Profile: No results for input(s): "CHOL", "HDL", "LDLCALC", "TRIG", "CHOLHDL", "LDLDIRECT" in the last 72 hours. Thyroid function studies: No results for input(s): "TSH", "T4TOTAL", "T3FREE", "THYROIDAB" in the last 72 hours.  Invalid input(s): "FREET3" Anemia work up: No results for input(s): "VITAMINB12", "FOLATE", "FERRITIN", "TIBC", "IRON", "RETICCTPCT" in the last 72 hours. Sepsis Labs: Recent Labs  Lab 12/19/22 1410 12/20/22 1811 12/21/22 0512 12/22/22 0455  WBC 6.3 6.2 5.1 5.1     Microbiology No results found for this or any previous visit (from the past 240 hour(s)).   Medications:    Continuous Infusions:  heparin 1,500 Units/hr (12/22/22 0301)      LOS: 1 day   Charlynne Cousins  [facility]  12/22/2022, 8:40 AM

## 2022-12-22 NOTE — Progress Notes (Signed)
  Transition of Care [facility]) Screening Note   Patient Details  Name: Wayne Lamb Date of Birth: [birth year]   Transition of Care [facility]) CM/SW Contact:    Dessa Phi, RN Phone Number: 12/22/2022, 2:06 PM    Transition of Care Department [facility]) has reviewed patient and no TOC needs have been identified at this time. We will continue to monitor patient advancement through interdisciplinary progression rounds. If new patient transition needs arise, please place a TOC consult.

## 2022-12-22 NOTE — Plan of Care (Signed)

## 2022-12-22 NOTE — Progress Notes (Signed)
Mobility Specialist - Progress Note   12/22/22 1423  Mobility  Activity Ambulated independently in hallway  Level of Assistance Independent  Assistive Device None  Distance Ambulated (ft) 480 ft  Activity Response Tolerated well  Mobility Referral Yes  $Mobility charge 1 Mobility   Pt received in bed and agreeable to mobility. No complaints during session. Pt to bed after session with all needs met.    [facility]

## 2022-12-22 NOTE — TOC Benefit Eligibility Note (Signed)
Patient Teacher, English as a foreign language completed.    The patient is currently admitted and upon discharge could be taking Eliquis 5 mg.  The current 30 day co-pay is $47.00.   The patient is insured through [place] [place] Patient Advocate Specialist [place] Patient Advocate Team Direct Number: [phone]  Fax: [phone]

## 2022-12-22 NOTE — Plan of Care (Signed)

## 2022-12-22 NOTE — Progress Notes (Signed)
  Echocardiogram 2D Echocardiogram has been performed.  Wayne Lamb 12/22/2022, 10:21 AM

## 2022-12-22 NOTE — Progress Notes (Signed)
Mobility Specialist - Progress Note   12/22/22 1030  Mobility  Activity Ambulated with assistance in hallway  Level of Assistance Modified independent, requires aide device or extra time  Assistive Device Other (Comment) (IV Pole)  Distance Ambulated (ft) 480 ft  Activity Response Tolerated well  Mobility Referral Yes  $Mobility charge 1 Mobility   Pt received in bed and agreeable to mobility. No complaints during session. Pt to bathroom after session with all needs met.    [facility]

## 2022-12-23 ENCOUNTER — Other Ambulatory Visit (HOSPITAL_COMMUNITY): Payer: Self-pay

## 2022-12-23 DIAGNOSIS — I2692 Saddle embolus of pulmonary artery without acute cor pulmonale: Secondary | ICD-10-CM | POA: Diagnosis not present

## 2022-12-23 LAB — CBC
HCT: 45.9 % (ref 39.0–52.0)
Hemoglobin: 16.1 g/dL (ref 13.0–17.0)
MCH: 30 pg (ref 26.0–34.0)
MCHC: 35.1 g/dL (ref 30.0–36.0)
MCV: 85.5 fL (ref 80.0–100.0)
Platelets: 192 10*3/uL (ref 150–400)
RBC: 5.37 MIL/uL (ref 4.22–5.81)
RDW: 12.9 % (ref 11.5–15.5)
WBC: 5.1 10*3/uL (ref 4.0–10.5)
nRBC: 0 % (ref 0.0–0.2)

## 2022-12-23 LAB — HEPARIN LEVEL (UNFRACTIONATED): Heparin Unfractionated: 0.58 IU/mL (ref 0.30–0.70)

## 2022-12-23 MED ORDER — APIXABAN (ELIQUIS) VTE STARTER PACK (10MG AND 5MG)
ORAL_TABLET | ORAL | 0 refills | Status: DC
  Filled 2022-12-23: qty 74, 28d supply, fill #0

## 2022-12-23 MED ORDER — APIXABAN 5 MG PO TABS: 5.0000 mg | ORAL_TABLET | Freq: Two times a day (BID) | ORAL | Status: DC

## 2022-12-23 MED ORDER — APIXABAN 5 MG PO TABS
5.0000 mg | ORAL_TABLET | Freq: Two times a day (BID) | ORAL | 3 refills | Status: DC
  Filled 2022-12-23 – 2023-01-19 (×2): qty 60, 30d supply, fill #0

## 2022-12-23 MED ORDER — APIXABAN 5 MG PO TABS
10.0000 mg | ORAL_TABLET | Freq: Two times a day (BID) | ORAL | 0 refills | Status: DC
  Filled 2022-12-23: qty 14, 4d supply, fill #0

## 2022-12-23 MED ORDER — APIXABAN 5 MG PO TABS
10.0000 mg | ORAL_TABLET | Freq: Two times a day (BID) | ORAL | Status: DC
  Administered 2022-12-23: 10 mg via ORAL
  Filled 2022-12-23: qty 2

## 2022-12-23 NOTE — Discharge Summary (Addendum)
Physician Discharge Summary  Wayne Lamb WGN:[id number] DOB: Oct 23, 1946 DOA: 12/20/2022  PCP: [name], MD  Admit date: 12/20/2022 Discharge date: 12/23/2022  Admitted From: Home Disposition:  Home  Recommendations for Outpatient Follow-up:  Follow up with PCP in 1-2 weeks Please obtain BMP/CBC in one week   Home Health:No Equipment/Devices:No  Discharge Condition:Stable CODE STATUS:Full Diet recommendation: Heart Healthy  Brief/Interim Summary: 77 y.o. male no apparent significant medical history comes into the ED from an outpatient facility due to a saddle PE, according to the patient he has been experience dyspnea with exertion about 2 weeks prior to admission, he also remembers left calf pain went to his PCP D-dimer was elevated CTA was performed that showed saddle PE in the ED heart rate in the 80s blood pressure 189/100 satting 95% on room air   Discharge Diagnoses:  Principal Problem:   Acute saddle pulmonary embolism ([place]) Active Problems:   Obesity (BMI 30-39.9)   Hyperglycemia  Acute saddle pulmonary embolism: Was started on IV heparin 2D echo showed no right-sided heart failure, lower extremity Doppler showed a DVT. He was continue on heparin for 48 hours then transition to oral Eliquis which she will continue as an outpatient.  Hyperglycemia: Likely reactive now resolved.  Discharge Instructions  Discharge Instructions     Diet - low sodium heart healthy   Complete by: As directed    Increase activity slowly   Complete by: As directed       Allergies as of 12/23/2022   No Known Allergies      Medication List     STOP taking these medications    ibuprofen 200 MG tablet Commonly known as: ADVIL   NON FORMULARY   QUERCETIN PO   VITAMIN K PO       TAKE these medications    Apixaban Starter Pack ('10mg'$  and '5mg'$ ) Commonly known as: ELIQUIS STARTER PACK Take as directed on package: start with two-'5mg'$  tablets twice daily for 7 days.  On day 8, switch to one-'5mg'$  tablet twice daily.   apixaban 5 MG Tabs tablet Commonly known as: ELIQUIS Take 2 tablets (10 mg total) by mouth 2 (two) times daily.   apixaban 5 MG Tabs tablet Commonly known as: ELIQUIS Take 1 tablet (5 mg total) by mouth 2 (two) times daily. Start taking on: December 30, 2022   NON FORMULARY 1 capsule by Other route See admin instructions. Inflamma-X capsules- Take 1 capsule by mouth every other day   VITAMIN C PO Take 1 tablet by mouth daily.   VITAMIN D3 PO Take 1 capsule by mouth daily.   ZINC CITRATE PO Take 1 tablet by mouth daily.        No Known Allergies  Consultations: None   Procedures/Studies: ECHOCARDIOGRAM COMPLETE  Result Date: 12/22/2022    ECHOCARDIOGRAM REPORT   Patient Name:   Wayne Lamb Date of Exam: 12/22/2022 Medical Rec #:  [id number]      Height:       69.0 in Accession #:    [id number]     Weight:       215.2 lb Date of Birth:  1946/03/11      BSA:          2.131 m Patient Age:    25 years       BP:           145/80 mmHg Patient Gender: M  HR:           68 bpm. Exam Location:  Inpatient Procedure: 2D Echo, Color Doppler, Cardiac Doppler and Intracardiac            Opacification Agent Indications:    Pulmonary embolus  History:        Patient has no prior history of Echocardiogram examinations.                 Risk Factors:Diabetes, Hypertension, Obesity, Sleep Apnea and                 Former Smoker.  Sonographer:    Eartha Inch Referring Phys: [phone] ERIC CHEN  Sonographer Comments: Technically difficult study due to poor echo windows. Image acquisition challenging due to patient body habitus and Image acquisition challenging due to respiratory motion. IMPRESSIONS  1. Left ventricular ejection fraction, by estimation, is 55 to 60%. The left ventricle has normal function. The left ventricle has no regional wall motion abnormalities. Left ventricular diastolic parameters are consistent with Grade I diastolic  dysfunction (impaired relaxation).  2. Right ventricular systolic function is normal. The right ventricular size is normal. Tricuspid regurgitation signal is inadequate for assessing PA pressure.  3. The mitral valve is normal in structure. No evidence of mitral valve regurgitation.  4. The aortic valve is normal in structure. Aortic valve regurgitation is not visualized.  5. The inferior vena cava is normal in size with greater than 50% respiratory variability, suggesting right atrial pressure of 3 mmHg. Conclusion(s)/Recommendation(s): Normal biventricular function without evidence of hemodynamically significant valvular heart disease. FINDINGS  Left Ventricle: Left ventricular ejection fraction, by estimation, is 55 to 60%. The left ventricle has normal function. The left ventricle has no regional wall motion abnormalities. Definity contrast agent was given IV to delineate the left ventricular  endocardial borders. The left ventricular internal cavity size was normal in size. There is no left ventricular hypertrophy. Left ventricular diastolic parameters are consistent with Grade I diastolic dysfunction (impaired relaxation). Right Ventricle: The right ventricular size is normal. No increase in right ventricular wall thickness. Right ventricular systolic function is normal. Tricuspid regurgitation signal is inadequate for assessing PA pressure. Left Atrium: Left atrial size was normal in size. Right Atrium: Right atrial size was normal in size. Pericardium: There is no evidence of pericardial effusion. Mitral Valve: The mitral valve is normal in structure. No evidence of mitral valve regurgitation. MV peak gradient, 4.0 mmHg. The mean mitral valve gradient is 2.0 mmHg. Tricuspid Valve: The tricuspid valve is normal in structure. Tricuspid valve regurgitation is not demonstrated. Aortic Valve: The aortic valve is normal in structure. Aortic valve regurgitation is not visualized. Pulmonic Valve: The pulmonic valve  was not well visualized. Pulmonic valve regurgitation is not visualized. Aorta: The aortic root and ascending aorta are structurally normal, with no evidence of dilitation. Venous: The inferior vena cava is normal in size with greater than 50% respiratory variability, suggesting right atrial pressure of 3 mmHg. IAS/Shunts: No atrial level shunt detected by color flow Doppler.  LEFT VENTRICLE PLAX 2D LVIDd:         4.50 cm   Diastology LVIDs:         3.60 cm   LV e' medial:    5.00 cm/s LV PW:         1.10 cm   LV E/e' medial:  13.3 LV IVS:        1.30 cm   LV e' lateral:   5.44 cm/s  LVOT diam:     1.70 cm   LV E/e' lateral: 12.2 LV SV:         45 LV SV Index:   21 LVOT Area:     2.27 cm  RIGHT VENTRICLE RV S prime:     8.70 cm/s TAPSE (M-mode): 1.6 cm LEFT ATRIUM             Index        RIGHT ATRIUM           Index LA diam:        4.30 cm 2.02 cm/m   RA Area:     11.50 cm LA Vol (A2C):   43.1 ml 20.22 ml/m  RA Volume:   22.90 ml  10.75 ml/m LA Vol (A4C):   44.8 ml 21.02 ml/m LA Biplane Vol: 46.3 ml 21.72 ml/m  AORTIC VALVE LVOT Vmax:   92.70 cm/s LVOT Vmean:  71.200 cm/s LVOT VTI:    0.199 m  AORTA Ao Root diam: 3.20 cm Ao Asc diam:  3.20 cm MITRAL VALVE MV Area (PHT): 2.32 cm    SHUNTS MV Area VTI:   1.60 cm    Systemic VTI:  0.20 m MV Peak grad:  4.0 mmHg    Systemic Diam: 1.70 cm MV Mean grad:  2.0 mmHg MV Vmax:       1.00 m/s MV Vmean:      58.9 cm/s MV Decel Time: 327 msec MV E velocity: 66.40 cm/s MV A velocity: 80.60 cm/s MV E/A ratio:  0.82 Mary Scientist, physiological signed by Phineas Inches Signature Date/Time: 12/22/2022/11:03:40 AM    Final    VAS [place] LOWER EXTREMITY VENOUS (DVT)  Result Date: 12/21/2022  Lower Venous DVT Study Patient Name:  Wayne Lamb  Date of Exam:   12/21/2022 Medical Rec #: [id number]       Accession #:    [id number] Date of Birth: May 30, 1946       Patient Gender: M Patient Age:   66 years Exam Location:  [facility] Procedure:      VAS [place] LOWER EXTREMITY VENOUS  (DVT) Referring Phys: ERIC CHEN --------------------------------------------------------------------------------  Indications: Pulmonary embolism, and LLE swelling.  Performing Technologist: Rogelia Rohrer RVT, RDMS  Examination Guidelines: A complete evaluation includes B-mode imaging, spectral Doppler, color Doppler, and power Doppler as needed of all accessible portions of each vessel. Bilateral testing is considered an integral part of a complete examination. Limited examinations for reoccurring indications may be performed as noted. The reflux portion of the exam is performed with the patient in reverse Trendelenburg.  +---------+---------------+---------+-----------+----------+--------------+ RIGHT    CompressibilityPhasicitySpontaneityPropertiesThrombus Aging +---------+---------------+---------+-----------+----------+--------------+ CFV      Full           Yes      Yes                                 +---------+---------------+---------+-----------+----------+--------------+ SFJ      Full                                                        +---------+---------------+---------+-----------+----------+--------------+ FV Prox  Full           Yes      Yes                                 +---------+---------------+---------+-----------+----------+--------------+  FV Mid   Full           Yes      Yes                                 +---------+---------------+---------+-----------+----------+--------------+ FV DistalFull           Yes      Yes                                 +---------+---------------+---------+-----------+----------+--------------+ PFV      Full                                                        +---------+---------------+---------+-----------+----------+--------------+ POP      Full           Yes      Yes                                 +---------+---------------+---------+-----------+----------+--------------+ PTV      Full                                                         +---------+---------------+---------+-----------+----------+--------------+ PERO     Full                                                        +---------+---------------+---------+-----------+----------+--------------+   +---------+---------------+---------+-----------+----------+--------------+ LEFT     CompressibilityPhasicitySpontaneityPropertiesThrombus Aging +---------+---------------+---------+-----------+----------+--------------+ CFV      Partial        Yes      Yes                  Acute          +---------+---------------+---------+-----------+----------+--------------+ SFJ      Full                                                        +---------+---------------+---------+-----------+----------+--------------+ FV Prox  None           No       No                   Acute          +---------+---------------+---------+-----------+----------+--------------+ FV Mid   None           No       No                   Acute          +---------+---------------+---------+-----------+----------+--------------+ FV DistalNone           No       No  Acute          +---------+---------------+---------+-----------+----------+--------------+ PFV      Full                                                        +---------+---------------+---------+-----------+----------+--------------+ POP      None           No       No                   Acute          +---------+---------------+---------+-----------+----------+--------------+ PTV      None           No       No                   Acute          +---------+---------------+---------+-----------+----------+--------------+ PERO     None           No       No                   Acute          +---------+---------------+---------+-----------+----------+--------------+ Gastroc  None           No       No                   Acute           +---------+---------------+---------+-----------+----------+--------------+     Summary: RIGHT: - There is no evidence of deep vein thrombosis in the lower extremity.  LEFT: - Findings consistent with acute deep vein thrombosis involving the left common femoral vein, left femoral vein, left popliteal vein, left posterior tibial veins, left peroneal veins, and left gastrocnemius veins.  *See table(s) above for measurements and observations. Electronically signed by Harold Barban MD on 12/21/2022 at 8:55:03 PM.    Final    CT Angio Chest W/Cm &/Or Wo Cm  Result Date: 12/20/2022 CLINICAL DATA:  Shortness of breath for 2 weeks.  Elevated D-dimer. EXAM: CT ANGIOGRAPHY CHEST WITH CONTRAST TECHNIQUE: Multidetector CT imaging of the chest was performed using the standard protocol during bolus administration of intravenous contrast. Multiplanar CT image reconstructions and MIPs were obtained to evaluate the vascular anatomy. RADIATION DOSE REDUCTION: This exam was performed according to the departmental dose-optimization program which includes automated exposure control, adjustment of the mA and/or kV according to patient size and/or use of iterative reconstruction technique. CONTRAST:  76m OMNIPAQUE IOHEXOL 350 MG/ML SOLN COMPARISON:  Chest x-ray 2006 report. FINDINGS: Cardiovascular: The heart is nonenlarged. Trace pericardial effusion. The thoracic aorta has a overall normal course and caliber with minimal atherosclerotic changes. More prominent calcified plaque along the proximal vertebral arteries at the edge of the imaging field and along the coronary arteries. Significant pulmonary emboli identified including a saddle embolus across the bifurcation of the main pulmonary artery. Extensive clot burden in the right lower lobe with poor enhancement. Clot also seen extending along the lobar pulmonary artery of the middle lobe and segmental in the right upper lobe. On the left there some central areas of thrombus  greatest in the left upper lobe pulmonary artery. No shift of the ventricular septum to suggest right heart strain but there is some enlargement of the  pulmonary artery. Please correlate for any clinical evidence of right heart strain. Mediastinum/Nodes: No specific abnormal lymph node enlargement identified in the axillary region, hilum or mediastinum. Moderate hiatal hernia. Normal caliber thoracic esophagus. Lungs/Pleura: Right lower lobe nodule identified in the costophrenic angle. On axial series 6 image 130 this measures 2.1 by 1.6 cm and is noncalcified. Elsewhere there is no consolidation, pneumothorax or effusion. Mild linear opacity seen elsewhere along the lung bases likely scar or atelectasis. Few punctate nodules identified in the middle lobe such as series 6, image 84, under 3 mm. Slightly larger focus on image 92 in the middle lobe measuring 6 x 3 mm. Upper Abdomen: In the upper abdomen the adrenal glands are preserved. There is a right renal lesion at the edge of the imaging field with diameter of 5.2 cm. This has Hounsfield unit of 4 consistent with cyst but is incompletely included in the imaging field recommend dedicated workup when appropriate to confirm a benign cyst such as ultrasound. Please correlate with any prior. Musculoskeletal: Scattered degenerative changes along the spine. Review of the MIP images confirms the above findings. IMPRESSION: Large pulmonary embolism identified with saddle component. Significant areas of thrombus with poor enhancement particularly of the right lower lobe and left upper lobe. There is some enlargement of the pulmonary artery but no clear intraventricular septal shift. Please correlate for any clinical evidence of right heart strain. Noncalcified 2.1 cm right lower lobe lung nodule. Please as well correlate with any prior study to assess for stability. Consider one of the following in 3 months for both low-risk and high-risk individuals: (a) repeat chest CT,  (b) follow-up PET-CT, or (c) tissue sampling. This recommendation follows the consensus statement: Guidelines for Management of Incidental Pulmonary Nodules Detected on CT Images: From the Fleischner Society 2017; Radiology 2017; 284:228-243. Moderate hiatal hernia. Probable right renal cyst at the edge of the imaging field but this lesion is not completely included in the imaging field of this chest exam and would recommend further workup when appropriate if there is no prior such as a renal ultrasound. Critical Value/emergent results were called by telephone at the time of interpretation on 12/20/2022 at 2:46 pm to provider [facility] , who verbally acknowledged these results. Aortic Atherosclerosis (ICD10-I70.0). Electronically Signed   By: Jill Side M.D.   On: 12/20/2022 15:10   (Echo, Carotid, EGD, Colonoscopy, ERCP)    Subjective: No complaints  Discharge Exam: Vitals:   12/22/22 2121 12/23/22 0523  BP: (!) 161/87 (!) 133/93  Pulse: 66 77  Resp: 20 20  Temp: 99 F (37.2 C) 98.5 F (36.9 C)  SpO2: 96% 96%   Vitals:   12/22/22 1335 12/22/22 2121 12/23/22 0500 12/23/22 0523  BP: (!) 149/87 (!) 161/87  (!) 133/93  Pulse: 71 66  77  Resp: '17 20  20  '$ Temp: 98.4 F (36.9 C) 99 F (37.2 C)  98.5 F (36.9 C)  TempSrc: Oral Oral  Oral  SpO2: 93% 96%  96%  Weight:   96.1 kg   Height:        General: Pt is alert, awake, not in acute distress Cardiovascular: RRR, S1/S2 +, no rubs, no gallops Respiratory: CTA bilaterally, no wheezing, no rhonchi Abdominal: Soft, NT, ND, bowel sounds + Extremities: no edema, no cyanosis    The results of significant diagnostics from this hospitalization (including imaging, microbiology, ancillary and laboratory) are listed below for reference.     Microbiology: No results found for this or any  previous visit (from the past 240 hour(s)).   Labs: BNP (last 3 results) Recent Labs    12/20/22 1811  BNP 41.3   Basic Metabolic  Panel: Recent Labs  Lab 12/19/22 1410 12/20/22 1811  NA 139 137  K 4.9 4.5  CL 102 99  CO2 27 27  GLUCOSE 157* 157*  BUN 16 16  CREATININE 0.86 1.03  CALCIUM 10.2 9.3   Liver Function Tests: Recent Labs  Lab 12/19/22 1410 12/20/22 1811  AST 15 20  ALT 20 22  ALKPHOS 123* 94  BILITOT 0.6 0.9  PROT 7.4 7.5  ALBUMIN 4.6 3.9   No results for input(s): "LIPASE", "AMYLASE" in the last 168 hours. No results for input(s): "AMMONIA" in the last 168 hours. CBC: Recent Labs  Lab 12/19/22 1410 12/20/22 1811 12/21/22 0512 12/22/22 0455 12/23/22 0506  WBC 6.3 6.2 5.1 5.1 5.1  NEUTROABS 4.3 4.1  --   --   --   HGB 17.0 16.3 15.5 15.8 16.1  HCT 48.7 46.9 44.7 45.6 45.9  MCV 87.0 85.9 86.5 86.7 85.5  PLT 215.0 195 184 191 192   Cardiac Enzymes: No results for input(s): "CKTOTAL", "CKMB", "CKMBINDEX", "TROPONINI" in the last 168 hours. BNP: Invalid input(s): "POCBNP" CBG: No results for input(s): "GLUCAP" in the last 168 hours. D-Dimer No results for input(s): "DDIMER" in the last 72 hours. Hgb A1c No results for input(s): "HGBA1C" in the last 72 hours. Lipid Profile No results for input(s): "CHOL", "HDL", "LDLCALC", "TRIG", "CHOLHDL", "LDLDIRECT" in the last 72 hours. Thyroid function studies No results for input(s): "TSH", "T4TOTAL", "T3FREE", "THYROIDAB" in the last 72 hours.  Invalid input(s): "FREET3" Anemia work up No results for input(s): "VITAMINB12", "FOLATE", "FERRITIN", "TIBC", "IRON", "RETICCTPCT" in the last 72 hours. Urinalysis    Component Value Date/Time   BILIRUBINUR negative 07/19/2019 1454   PROTEINUR Positive (A) 07/19/2019 1454   UROBILINOGEN 0.2 07/19/2019 1454   NITRITE negative 07/19/2019 1454   LEUKOCYTESUR Negative 07/19/2019 1454   Sepsis Labs Recent Labs  Lab 12/20/22 1811 12/21/22 0512 12/22/22 0455 12/23/22 0506  WBC 6.2 5.1 5.1 5.1   Microbiology No results found for this or any previous visit (from the past 240  hour(s)).  SIGNED:   Charlynne Cousins, MD  [facility] 12/23/2022, 8:16 AM Pager   If 7PM-7AM, please contact night-coverage [email or web address] Password [email or web address]

## 2022-12-23 NOTE — Progress Notes (Signed)
ANTICOAGULATION CONSULT NOTE - Initial Consult  Pharmacy Consult for Eliquis Indication:  DVT and PE  No Known Allergies  Patient Measurements: Height: '5\' 9"'$  (175.3 cm) Weight: 96.1 kg (211 lb 13.8 oz) IBW/kg (Calculated) : 70.7  Vital Signs: Temp: 98.5 F (36.9 C) (01/26 0523) Temp Source: Oral (01/26 0523) BP: 133/93 (01/26 0523) Pulse Rate: 77 (01/26 0523)  Labs: Recent Labs    12/20/22 1811 12/20/22 1811 12/20/22 2106 12/21/22 0512 12/21/22 1257 12/22/22 0455 12/23/22 0506  HGB 16.3  --   --  15.5  --  15.8 16.1  HCT 46.9  --   --  44.7  --  45.6 45.9  PLT 195  --   --  184  --  191 192  APTT 26  --   --   --   --   --   --   LABPROT 13.2  --   --   --   --   --   --   INR 1.0  --   --   --   --   --   --   HEPARINUNFRC  --    < >  --  0.47 0.47 0.49 0.58  CREATININE 1.03  --   --   --   --   --   --   TROPONINIHS 10  --  10  --   --   --   --    < > = values in this interval not displayed.    Estimated Creatinine Clearance: 69.8 mL/min (by C-G formula based on SCr of 1.03 mg/dL).   Medical History: Past Medical History:  Diagnosis Date   Elevated PSA    MRI prostate was done 2016 to eval persistently rising PSAs (no biopsy had been done): this showed no sign of prostate ca.  Stable 07/2017, 01/2018, 01/2019, 12/2019, 05/2020.   Encounter for hepatitis C virus screening test for high risk patient    Done by [facility] physicians 01/18/17   Hypercholesterolemia    Lumbar spondylosis    MRI L spine 2005: severe facet deg, SI deg   Obesity, Class II, BMI 35-39.9    OSA on CPAP    compliant   Prostate nodule 2016   prostate nodule or ridge in R prostate lobe--w/u reassuring 2016    Rhinitis    Saddle pulmonary embolus ([place])    12/20/22 hosp   Tubular adenoma of colon 2014   recommend repeat colonoscopy 2019   Assessment:  AC/Heme: Heparin drip for left DVT, saddle PE.  - CBC remains WNL. Hep level 0.58 in goal. Transition to Eliquis today.  Goal of Therapy:   Therapeutic oral anticoagulation   Plan:  -TOC Eligibility check: Co-pay $47 Eliquis '10mg'$  BID x 7d then '5mg'$  BID D/c Vitamin K, ibuprofen and other supplements at at discharge   [place]. Alford Highland, PharmD, BCPS Clinical Staff Pharmacist [email or web address] Alford Highland, [employer] 12/23/2022,7:34 AM

## 2022-12-23 NOTE — Plan of Care (Signed)

## 2022-12-26 ENCOUNTER — Telehealth: Payer: Self-pay

## 2022-12-26 NOTE — Patient Outreach (Signed)
  Care Coordination TOC Note Transition Care Management Follow-up Telephone Call Date of discharge and from where: 12/23/22-[facility]  Dx: "acute saddle PE" How have you been since you were released from the hospital? Patient states he is "doing great and feels wonderful." He has had no issues or concerns since returning home. He has resumed pre-hospital level functioning and activity. Any questions or concerns? No  Items Reviewed: Did the pt receive and understand the discharge instructions provided? Yes  Medications obtained and verified? Yes  Other? No  Any new allergies since your discharge? No  Dietary orders reviewed? Yes Do you have support at home? Yes   Home Care and Equipment/Supplies: Were home health services ordered? not applicable If so, what is the name of the agency? N/A  Has the agency set up a time to come to the patient's home? not applicable Were any new equipment or medical supplies ordered?  No What is the name of the medical supply agency? N/A Were you able to get the supplies/equipment? not applicable Do you have any questions related to the use of the equipment or supplies? No  Functional Questionnaire: (I = Independent and D = Dependent) ADLs: I  Bathing/Dressing- I  Meal Prep- I  Eating- I  Maintaining continence- I  Transferring/Ambulation- I  Managing Meds- I  Follow up appointments reviewed:  PCP Hospital f/u appt confirmed? Yes  Scheduled to see Dr. Anitra Lauth on 12/27/22 @ 11:20 am. [facility] f/u appt confirmed?  N/A   Are transportation arrangements needed? No  If their condition worsens, is the pt aware to call PCP or go to the Emergency Dept.? Yes Was the patient provided with contact information for the PCP's office or ED? Yes Was to pt encouraged to call back with questions or concerns? Yes  SDOH assessments and interventions completed:   Yes SDOH Interventions Today    Flowsheet Row Most Recent Value  SDOH  Interventions   Food Insecurity Interventions Intervention Not Indicated  Transportation Interventions Intervention Not Indicated       Care Coordination Interventions:   Interventions Today    Flowsheet Row Most Recent Value  Nutrition Interventions   Nutrition Discussed/Reviewed Nutrition Discussed  [low salt /heart healthy diet]  Pharmacy Interventions   Pharmacy Dicussed/Reviewed Medications and their functions  [Eliquis]            Encounter Outcome:  Pt. Visit Completed     Enzo Montgomery, RN,BSN,CCM [facility] Telephonic Care Management Coordinator Direct Phone: [phone] Toll Free: [phone] Fax: [phone]

## 2022-12-27 ENCOUNTER — Ambulatory Visit (INDEPENDENT_AMBULATORY_CARE_PROVIDER_SITE_OTHER): Payer: PPO | Admitting: Family Medicine

## 2022-12-27 ENCOUNTER — Encounter: Payer: Self-pay | Admitting: Family Medicine

## 2022-12-27 VITALS — BP 150/83 | HR 76 | Temp 98.1°F | Ht 69.0 in | Wt 218.6 lb

## 2022-12-27 DIAGNOSIS — I82492 Acute embolism and thrombosis of other specified deep vein of left lower extremity: Secondary | ICD-10-CM | POA: Diagnosis not present

## 2022-12-27 DIAGNOSIS — I2692 Saddle embolus of pulmonary artery without acute cor pulmonale: Secondary | ICD-10-CM

## 2022-12-27 NOTE — Progress Notes (Signed)
12/27/2022  CC:  Chief Complaint  Patient presents with   [name]    Pt was started on Eliquis starter pack   Patient is a 77 y.o. Caucasian male who presents for  hospital follow up, specifically TCM. Dates hospitalized: 1/23 to 12/23/2022 Days since d/c from hospital: 4 days Patient was discharged from hospital to home Reason for admission to hospital: Pulmonary embolism Date of interactive (phone) contact with patient and/or caregiver: 12/26/2022  I have reviewed patient's discharge summary plus pertinent specific notes, labs, and imaging from the hospitalization.   Wayne Lamb received heparin and was started on Eliquis.  Ultrasound imaging did confirm acute DVT in the left common femoral vein, left femoral vein, left popliteal vein, left posterior tibial veins, left peroneal veins, and left gastrocnemius veins. He was stable in the hospital, did not require any oxygen.  Currently feeling well.  He has much more stamina, does not get short of breath now. Left leg swelling--same.  No pain. No problems taking the Eliquis.  No signs of bleeding.  Medication reconciliation was done today and patient is taking meds as recommended by discharging hospitalist/specialist.    PMH:  Past Medical History:  Diagnosis Date   BPH with obstruction/lower urinary tract symptoms    Elevated PSA    MRI prostate was done 2016 to eval persistently rising PSAs (no biopsy had been done): this showed no sign of prostate ca.  Stable 07/2017, 01/2018, 01/2019, 12/2019, 05/2020.   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 Eddie Dibbles)   TONSILLECTOMY     TRANSTHORACIC ECHOCARDIOGRAM     12/22/22 normal except grd I DD    MEDS:  Outpatient Medications Prior to Visit  Medication Sig Dispense Refill   APIXABAN (ELIQUIS) VTE STARTER PACK ('10MG'$  AND '5MG'$ ) Take as directed on package: start with two-'5mg'$  tablets twice daily for 7 days. On day 8, switch to one-'5mg'$  tablet twice daily. 74 tablet 0   Ascorbic Acid (VITAMIN C PO) Take 1 tablet by mouth daily.     Cholecalciferol (VITAMIN D3 PO) Take 1 capsule by mouth daily.     NON FORMULARY 1 capsule by Other route See admin instructions. Inflamma-X capsules- Take 1 capsule by mouth every other day     ZINC CITRATE PO Take 1 tablet by mouth daily.     [START ON 12/30/2022] apixaban (ELIQUIS) 5 MG TABS tablet Take 1 tablet (5 mg total) by mouth 2 (two) times daily. (Patient not taking: Reported on 12/27/2022) 60 tablet 3   No facility-administered medications prior to visit.    Physical Exam    12/27/2022   11:07 AM 12/23/2022    5:23 AM 12/23/2022  5:00 AM  Vitals with BMI  Height '5\' 9"'$     Weight 218 lbs 10 oz  211 lbs 14 oz  BMI 42.35  36.14  Systolic 431 540   Diastolic 83 93   Pulse 76 77    Gen: Alert, well appearing.  Patient is oriented to person, place, time, and situation. AFFECT: pleasant, lucid thought and speech. LEFT LEG: 2+ pitting edema below the knee, no edema on Right. L Leg circ measured 10 below inf border of patella is 38 cm, right side is 36 cm No erythema, tenderness, or nodularity.  Pertinent labs/imaging Last CBC Lab Results  Component Value Date   WBC 5.1 12/23/2022   HGB 16.1 12/23/2022   HCT 45.9 12/23/2022   MCV 85.5 12/23/2022   MCH 30.0 12/23/2022   RDW 12.9  12/23/2022   PLT 192 08/67/6195   Last metabolic panel Lab Results  Component Value Date   GLUCOSE 157 (H) 12/20/2022   NA 137 12/20/2022   K 4.5 12/20/2022   CL 99 12/20/2022   CO2 27 12/20/2022   BUN 16 12/20/2022   CREATININE 1.03 12/20/2022   GFRNONAA >60 12/20/2022   CALCIUM 9.3 12/20/2022   PROT 7.5 12/20/2022   ALBUMIN 3.9 12/20/2022   BILITOT 0.9 12/20/2022   ALKPHOS 94 12/20/2022   AST 20 12/20/2022   ALT 22 12/20/2022   ANIONGAP 11 12/20/2022   Last lipids Lab Results  Component Value Date   CHOL 236 (H) 08/23/2019   HDL 51.10 08/23/2019   LDLCALC 152 (H) 08/23/2019   LDLDIRECT 159.0 08/16/2017   TRIG 164.0 (H) 08/23/2019   CHOLHDL 5 08/23/2019   Last hemoglobin A1c Lab Results  Component Value Date   HGBA1C 6.2 08/23/2019   Last thyroid functions Lab Results  Component Value Date   TSH 1.62 08/17/2018   ASSESSMENT/PLAN:  #1 acute saddle pulmonary embolism and acute (extensive) left leg DVT. Suspect due to relative immobility with a long car ride prior to symptoms. He is doing great.  Plan is to continue Eliquis for 6 months. He is active, plays pickle ball 3 days a week.  No restrictions at this time. He knows the signs/symptoms of bleeding to look for.  Medical decision making of moderate complexity was utilized today.  FOLLOW UP:  6 mo  Signed:  Crissie Sickles, MD           12/27/2022

## 2022-12-27 NOTE — Addendum Note (Signed)
Addended by: [name] on: 12/27/2022 12:27 PM   Modules accepted: Orders

## 2022-12-30 DIAGNOSIS — G4733 Obstructive sleep apnea (adult) (pediatric): Secondary | ICD-10-CM | POA: Diagnosis not present

## 2023-01-11 ENCOUNTER — Ambulatory Visit: Payer: PPO

## 2023-01-16 ENCOUNTER — Other Ambulatory Visit (HOSPITAL_COMMUNITY): Payer: Self-pay

## 2023-01-18 ENCOUNTER — Other Ambulatory Visit: Payer: Self-pay

## 2023-01-18 MED ORDER — APIXABAN 5 MG PO TABS: 5.0000 mg | ORAL_TABLET | Freq: Two times a day (BID) | ORAL | 1 refills | Status: DC

## 2023-01-18 NOTE — Telephone Encounter (Signed)
Patient refill request.  It states as of 1/30 that patient is not taking Eliquis as of 12/27/22.  Patient was prescribed med in hospital.  Patient had hosp f/u visit with Dr. Anitra Lauth on 12/27/22, patient states Dr. Anitra Lauth agreed that patient continue on meds for at least 6 months. Patient has 5 tablets left. Please call in new prescription.  Original prescriber was hospital provider and patient does not see provider in clinics.   [facility]   apixaban (ELIQUIS) 5 MG TABS tablet

## 2023-01-18 NOTE — Telephone Encounter (Signed)
Based on last visit, "He is doing great. Plan is to continue Eliquis for 6 months. "  Please advise if ok for refill

## 2023-01-18 NOTE — Telephone Encounter (Signed)
Pt advised refill sent. °

## 2023-01-19 ENCOUNTER — Other Ambulatory Visit (HOSPITAL_COMMUNITY): Payer: Self-pay

## 2023-01-25 ENCOUNTER — Ambulatory Visit (INDEPENDENT_AMBULATORY_CARE_PROVIDER_SITE_OTHER): Payer: PPO

## 2023-01-25 VITALS — BP 120/76 | HR 66 | Ht 69.0 in | Wt 223.6 lb

## 2023-01-25 DIAGNOSIS — Z0000 Encounter for general adult medical examination without abnormal findings: Secondary | ICD-10-CM

## 2023-01-25 NOTE — Patient Instructions (Signed)
Health Maintenance, Male Adopting a healthy lifestyle and getting preventive care are important in promoting health and wellness. Ask your health care provider about: The right schedule for you to have regular tests and exams. Things you can do on your own to prevent diseases and keep yourself healthy. What should I know about diet, weight, and exercise? Eat a healthy diet  Eat a diet that includes plenty of vegetables, fruits, low-fat dairy products, and lean protein. Do not eat a lot of foods that are high in solid fats, added sugars, or sodium. Maintain a healthy weight Body mass index (BMI) is a measurement that can be used to identify possible weight problems. It estimates body fat based on height and weight. Your health care provider can help determine your BMI and help you achieve or maintain a healthy weight. Get regular exercise Get regular exercise. This is one of the most important things you can do for your health. Most adults should: Exercise for at least 150 minutes each week. The exercise should increase your heart rate and make you sweat (moderate-intensity exercise). Do strengthening exercises at least twice a week. This is in addition to the moderate-intensity exercise. Spend less time sitting. Even light physical activity can be beneficial. Watch cholesterol and blood lipids Have your blood tested for lipids and cholesterol at 77 years of age, then have this test every 5 years. You may need to have your cholesterol levels checked more often if: Your lipid or cholesterol levels are high. You are older than 77 years of age. You are at high risk for heart disease. What should I know about cancer screening? Many types of cancers can be detected early and may often be prevented. Depending on your health history and family history, you may need to have cancer screening at various ages. This may include screening for: Colorectal cancer. Prostate cancer. Skin cancer. Lung  cancer. What should I know about heart disease, diabetes, and high blood pressure? Blood pressure and heart disease High blood pressure causes heart disease and increases the risk of stroke. This is more likely to develop in people who have high blood pressure readings or are overweight. Talk with your health care provider about your target blood pressure readings. Have your blood pressure checked: Every 3-5 years if you are 18-39 years of age. Every year if you are 40 years old or older. If you are between the ages of 65 and 75 and are a current or former smoker, ask your health care provider if you should have a one-time screening for abdominal aortic aneurysm (AAA). Diabetes Have regular diabetes screenings. This checks your fasting blood sugar level. Have the screening done: Once every three years after age 45 if you are at a normal weight and have a low risk for diabetes. More often and at a younger age if you are overweight or have a high risk for diabetes. What should I know about preventing infection? Hepatitis B If you have a higher risk for hepatitis B, you should be screened for this virus. Talk with your health care provider to find out if you are at risk for hepatitis B infection. Hepatitis C Blood testing is recommended for: Everyone born from 1945 through 1965. Anyone with known risk factors for hepatitis C. Sexually transmitted infections (STIs) You should be screened each year for STIs, including gonorrhea and chlamydia, if: You are sexually active and are younger than 77 years of age. You are older than 77 years of age and your   health care provider tells you that you are at risk for this type of infection. Your sexual activity has changed since you were last screened, and you are at increased risk for chlamydia or gonorrhea. Ask your health care provider if you are at risk. Ask your health care provider about whether you are at high risk for HIV. Your health care provider  may recommend a prescription medicine to help prevent HIV infection. If you choose to take medicine to prevent HIV, you should first get tested for HIV. You should then be tested every 3 months for as long as you are taking the medicine. Follow these instructions at home: Alcohol use Do not drink alcohol if your health care provider tells you not to drink. If you drink alcohol: Limit how much you have to 0-2 drinks a day. Know how much alcohol is in your drink. In the [place] one drink equals one 12 oz bottle of beer (355 mL), one 5 oz glass of wine (148 mL), or one 1 oz glass of hard liquor (44 mL). Lifestyle Do not use any products that contain nicotine or tobacco. These products include cigarettes, chewing tobacco, and vaping devices, such as e-cigarettes. If you need help quitting, ask your health care provider. Do not use street drugs. Do not share needles. Ask your health care provider for help if you need support or information about quitting drugs. General instructions Schedule regular health, dental, and eye exams. Stay current with your vaccines. Tell your health care provider if: You often feel depressed. You have ever been abused or do not feel safe at home. Summary Adopting a healthy lifestyle and getting preventive care are important in promoting health and wellness. Follow your health care provider's instructions about healthy diet, exercising, and getting tested or screened for diseases. Follow your health care provider's instructions on monitoring your cholesterol and blood pressure. This information is not intended to replace advice given to you by your health care provider. Make sure you discuss any questions you have with your health care provider. Document Revised: 04/05/2021 Document Reviewed: 04/05/2021 Elsevier Patient Education  2023 [employer]  

## 2023-01-25 NOTE — Progress Notes (Addendum)
Subjective:   Wayne Lamb is a 77 y.o. male who presents for Medicare Annual/Subsequent preventive examination.  Review of Systems    Defer to PCP Cardiac Risk Factors include: advanced age (>83mn, >>56women);family history of premature cardiovascular disease;hypertension;male gender;obesity (BMI >30kg/m2)     Objective:    Today's Vitals   01/25/23 1138  BP: 120/76  Pulse: 66  SpO2: 94%  Weight: 223 lb 9.6 oz (101.4 kg)  Height: '5\' 9"'$  (1.753 m)   Body mass index is 33.02 kg/m.     01/25/2023   11:27 AM 12/20/2022    4:54 PM 09/21/2018   10:09 AM 10/06/2017   11:59 AM 06/08/2016    4:26 PM  Advanced Directives  Does Patient Have a Medical Advance Directive? Yes No Yes Yes No  Type of AParamedicof ABryceLiving will  Living will;Healthcare Power of Attorney Living will   Does patient want to make changes to medical advance directive? No - Patient declined      Copy of HYelmin Chart? No - copy requested  No - copy requested    Would patient like information on creating a medical advance directive?  No - Patient declined   No - patient declined information    Current Medications (verified) Outpatient Encounter Medications as of 01/25/2023  Medication Sig   apixaban (ELIQUIS) 5 MG TABS tablet Take 1 tablet (5 mg total) by mouth 2 (two) times daily.   Ascorbic Acid (VITAMIN C PO) Take 1 tablet by mouth daily.   Boswellia-Glucosamine-Vit D (OSTEO BI-FLEX ONE PER DAY PO) Take 1 tablet by mouth daily.   Cholecalciferol (VITAMIN D3 PO) Take 1 capsule by mouth daily.   NON FORMULARY 1 capsule by Other route See admin instructions. Inflamma-X capsules- Take 1 capsule by mouth every other day   No facility-administered encounter medications on file as of 01/25/2023.    Allergies (verified) Patient has no known allergies.   History: Past Medical History:  Diagnosis Date   BPH with obstruction/lower urinary tract symptoms     Cataract    Elevated PSA    MRI prostate was done 2016 to eval persistently rising PSAs (no biopsy had been done): this showed no sign of prostate ca.  Stable 07/2017, 01/2018, 01/2019, 12/2019, 05/2020.   Encounter for hepatitis C virus screening test for high risk patient    [facility]   Hypercholesterolemia    Leg DVT (deep venous thromboembolism), acute, left ([facility]    11/2022   Lumbar spondylosis    MRI L spine 2005: severe facet deg, SI deg   Obesity, Class II, BMI 35-39.9    OSA on CPAP    compliant   Prostate nodule 2016   prostate nodule or ridge in R prostate lobe--w/u reassuring 2016    Rhinitis    [place]us [place]    [facility]22 [facility]   Sleep apnea    Tubular adenoma of colon 2014   recommend repeat colonoscopy 2019   Past Surgical History:  Procedure Laterality Date   FRACTURE SURGERY     INGUINAL HERNIA REPAIR Left    right clavicle fracture     pin inserted (Dr. PEddie Dibbles   TWellingtonECHOCARDIOGRAM     12/22/22 normal except grd I DD   Family History  Problem Relation Age of Onset   Alcohol abuse Father    Early death Father 580  unknown cause of death   Heart disease Mother    Heart disease Sister    Learning disabilities Brother        Dyslexia   Social History   Socioeconomic History   Marital status: Married    Spouse name: Not on file   Number of children: Not on file   Years of education: Not on file   Highest education level: Bachelor's degree (e.g., BA, AB, BS)  Occupational History   Not on file  Tobacco Use   Smoking status: Former    Packs/day: 1.00    Years: 15.00    Total pack years: 15.00    Types: Cigarettes    Quit date: 04/19/1980    Years since quitting: 42.7   Smokeless tobacco: Never  Vaping Use   Vaping Use: Never used  Substance and Sexual Activity   Alcohol use: Yes    Alcohol/week: 1.0 standard drink of alcohol    Types: 1 Shots of liquor per week    Comment: occ    Drug use: No   Sexual activity: Not Currently  Other Topics Concern   Not on file  Social History Narrative   Married, 1 son who is an Forensic psychologist in [place].   HE GREW UP IN [place]---WENT TO [employer]!   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   Tob: former smoker, 15 pack-yr hx, quit 1970.   Alc: no   Exercise: 2-3 days per week, CV and wt training.   Social Determinants of Health   Financial Resource Strain: Low Risk  (01/25/2023)   Overall Financial Resource Strain (CARDIA)    Difficulty of Paying Living Expenses: Not hard at all  Food Insecurity: No Food Insecurity (01/25/2023)   Hunger Vital Sign    Worried About Running Out of Food in the Last Year: Never true    Ran Out of Food in the Last Year: Never true  Transportation Needs: No Transportation Needs (01/25/2023)   PRAPARE - Hydrologist (Medical): No    Lack of Transportation (Non-Medical): No  Physical Activity: Sufficiently Active (12/19/2022)   Exercise Vital Sign    Days of Exercise per Week: 3 days    Minutes of Exercise per Session: 70 min  Stress: No Stress Concern Present (01/25/2023)   [place]    Feeling of Stress : Not at all  Social Connections: Unknown (01/25/2023)   Social Connection and Isolation Panel [NHANES]    Frequency of Communication with Friends and Family: Twice a week    Frequency of Social Gatherings with Friends and Family: Twice a week    Attends Religious Services: More than 4 times per year    Active Member of [employer] or Organizations: Not on file    Attends Archivist Meetings: Not on file    Marital Status: Married    Tobacco Counseling Counseling given: Not Answered   Clinical Intake:  Pre-visit preparation completed: No  Pain : No/denies pain     Diabetes: No  How often do you need to have someone help you when you read instructions, pamphlets, or  other written materials from your doctor or pharmacy?: 1 - Never What is the last grade level you completed in school?: graduated college  Diabetic?no  Interpreter Needed?: No      Activities of Daily Living    01/25/2023   11:34 AM 12/20/2022  11:00 PM  In your present state of health, do you have any difficulty performing the following activities:  Hearing? 0 0  Vision? 0 0  Difficulty concentrating or making decisions? 0 0  Walking or climbing stairs? 0 1  Dressing or bathing? 0 0  Doing errands, shopping? 0 0  Preparing Food and eating ? N   Using the Toilet? N   In the past six months, have you accidently leaked urine? N   Do you have problems with loss of bowel control? N   Managing your Medications? N   Managing your Finances? N   Housekeeping or managing your Housekeeping? N     Patient Care Team: [name], MD as PCP - General (Family Medicine) Deneise Lever, MD as Consulting Physician (Pulmonary Disease) Festus Aloe, MD as Consulting Physician (Urology) Christell Faith  Indicate any recent Medical Services you may have received from other than [facility] providers in the past year (date may be approximate).     Assessment:   This is a routine wellness examination for Carliss.  Hearing/Vision screen No results found.  Dietary issues and exercise activities discussed: Current Exercise Habits: Home exercise routine, Type of exercise: walking;Other - see comments, Time (Minutes): 60, Frequency (Times/Week): 3, Weekly Exercise (Minutes/Week): 180, Exercise limited by: None identified   Goals Addressed   None   Depression Screen    01/25/2023   11:26 AM 08/23/2019    9:00 AM 09/21/2018   10:10 AM 08/17/2018    9:58 AM 08/16/2017   10:42 AM 06/08/2016    4:27 PM  PHQ 2/9 Scores  PHQ - 2 Score 0 0 0 0 0 0    Fall Risk    01/25/2023   11:26 AM 12/19/2022   12:16 PM 08/23/2019    9:00 AM 09/21/2018   10:10 AM 08/17/2018    9:58 AM  Fall Risk   Falls  in the past year? 0 0 0 No No  Number falls in past yr: 0  0    Injury with Fall? 0  0    Risk for fall due to : No Fall Risks      Follow up Falls evaluation completed        [place]:  Any stairs in or around the home? Yes  If so, are there any without handrails? No  Home free of loose throw rugs in walkways, pet beds, electrical cords, etc? Yes  Adequate lighting in your home to reduce risk of falls? Yes   ASSISTIVE DEVICES UTILIZED TO PREVENT FALLS:  Life alert? No  Use of a cane, walker or w/c? No  Grab bars in the bathroom? Yes  Shower chair or bench in shower? No  Elevated toilet seat or a handicapped toilet? No   TIMED UP AND GO:  Was the test performed? Yes .  Length of time to ambulate 10 feet: 6 sec.   Gait steady and fast without use of assistive device  Cognitive Function:    09/21/2018   10:10 AM  MMSE - Mini Mental State Exam  Orientation to time 5  Orientation to Place 5  Registration 3  Attention/ Calculation 5  Recall 3  Language- name 2 objects 2  Language- repeat 1  Language- follow 3 step command 3  Language- read & follow direction 1  Write a sentence 1  Copy design 1  Total score 30        01/25/2023  11:27 AM  6CIT Screen  What Year? 0 points  What month? 0 points  What time? 0 points  Count back from 20 0 points  Months in reverse 0 points  Repeat phrase 0 points  Total Score 0 points    Immunizations Immunization History  Administered Date(s) Administered   Fluad Quad(high Dose 65+) 07/19/2019, 08/23/2019   Influenza, High Dose Seasonal PF 08/16/2017, 08/17/2018   Pneumococcal Conjugate-13 02/16/2015   Pneumococcal Polysaccharide-23 01/21/2013   Td 01/18/2017   Zoster, Live 01/21/2013    TDAP status: Up to date  Flu Vaccine status: Declined, Education has been provided regarding the importance of this vaccine but patient still declined. Advised may receive this vaccine at local  pharmacy or Health Dept. Aware to provide a copy of the vaccination record if obtained from local pharmacy or Health Dept. Verbalized acceptance and understanding.  Pneumococcal vaccine status: Up to date  Covid-19 vaccine status: Declined, Education has been provided regarding the importance of this vaccine but patient still declined. Advised may receive this vaccine at local pharmacy or Health Dept.or vaccine clinic. Aware to provide a copy of the vaccination record if obtained from local pharmacy or Health Dept. Verbalized acceptance and understanding.  Qualifies for Shingles Vaccine? Yes   Zostavax completed No   Shingrix Completed?: No.    Education has been provided regarding the importance of this vaccine. Patient has been advised to call insurance company to determine out of pocket expense if they have not yet received this vaccine. Advised may also receive vaccine at local pharmacy or Health Dept. Verbalized acceptance and understanding.  Screening Tests Health Maintenance  Topic Date Due   OPHTHALMOLOGY EXAM  Never done   Diabetic kidney evaluation - Urine ACR  02/15/2019   HEMOGLOBIN A1C  02/20/2020   FOOT EXAM  08/24/2020   COVID-19 Vaccine (1) 02/10/2023 (Originally 02/19/1947)   INFLUENZA VACCINE  02/26/2023 (Originally 06/28/2022)   Zoster Vaccines- Shingrix (1 of 2) 04/25/2023 (Originally 08/21/1996)   Diabetic kidney evaluation - eGFR measurement  12/21/2023   Medicare Annual Wellness (AWV)  01/26/2024   DTaP/Tdap/Td (2 - Tdap) 01/18/2027   Pneumonia Vaccine 26+ Years old  Completed   Hepatitis C Screening  Completed   HPV VACCINES  Aged Out    Health Maintenance  Health Maintenance Due  Topic Date Due   OPHTHALMOLOGY EXAM  Never done   Diabetic kidney evaluation - Urine ACR  02/15/2019   HEMOGLOBIN A1C  02/20/2020   FOOT EXAM  08/24/2020    Colorectal cancer screening: No longer required.   Lung Cancer Screening: (Low Dose CT Chest recommended if Age 69-80 years,  30 pack-year currently smoking OR have quit w/in 15years.) does not qualify.   Lung Cancer Screening Referral: n/a  Additional Screening:  Hepatitis C Screening: does qualify; Completed 01/18/2017  Vision Screening: Recommended annual ophthalmology exams for early detection of glaucoma and other disorders of the eye. Is the patient up to date with their annual eye exam?   Due soon Who is the provider or what is the name of the office in which the patient attends annual eye exams? [facility] If pt is not established with a provider, would they like to be referred to a provider to establish care? No .   Dental Screening: Recommended annual dental exams for proper oral hygiene  Community Resource Referral / Chronic Care Management: CRR required this visit?  No   CCM required this visit?  No  Plan:     I have personally reviewed and noted the following in the patient's chart:   Medical and social history Use of alcohol, tobacco or illicit drugs  Current medications and supplements including opioid prescriptions. Patient is not currently taking opioid prescriptions. Functional ability and status Nutritional status Physical activity Advanced directives List of other physicians Hospitalizations, surgeries, and ER visits in previous 12 months Vitals Screenings to include cognitive, depression, and falls Referrals and appointments  In addition, I have reviewed and discussed with patient certain preventive protocols, quality metrics, and best practice recommendations. A written personalized care plan for preventive services as well as general preventive health recommendations were provided to patient.     Beatrix Fetters, [place]   01/25/2023   Nurse Notes: Non-Face to Face or Face to Face 20 minute visit Encounter     Mr. Jalomo , Thank you for taking time to come for your Medicare Wellness Visit. I appreciate your ongoing commitment to your health goals. Please review  the following plan we discussed and let me know if I can assist you in the future.   These are the goals we discussed:  Goals      Weight (lb) < 180 lb (81.6 kg)     Lose weight by staying active and eating well.         This is a list of the screening recommended for you and due dates:  Health Maintenance  Topic Date Due   Eye exam for diabetics  Never done   Yearly kidney health urinalysis for diabetes  02/15/2019   Hemoglobin A1C  02/20/2020   Complete foot exam   08/24/2020   COVID-19 Vaccine (1) 02/10/2023*   Flu Shot  02/26/2023*   Zoster (Shingles) Vaccine (1 of 2) 04/25/2023*   Yearly kidney function blood test for diabetes  12/21/2023   Medicare Annual Wellness Visit  01/26/2024   DTaP/Tdap/Td vaccine (2 - Tdap) 01/18/2027   Pneumonia Vaccine  Completed   Hepatitis C Screening: USPSTF Recommendation to screen - Ages 2-79 yo.  Completed   HPV Vaccine  Aged Out  *Topic was postponed. The date shown is not the original due date.

## 2023-06-08 DIAGNOSIS — G4733 Obstructive sleep apnea (adult) (pediatric): Secondary | ICD-10-CM | POA: Diagnosis not present

## 2023-06-28 ENCOUNTER — Ambulatory Visit (INDEPENDENT_AMBULATORY_CARE_PROVIDER_SITE_OTHER): Payer: PPO | Admitting: Family Medicine

## 2023-06-28 ENCOUNTER — Encounter: Payer: Self-pay | Admitting: Family Medicine

## 2023-06-28 VITALS — BP 135/81 | HR 68 | Wt 224.8 lb

## 2023-06-28 DIAGNOSIS — N289 Disorder of kidney and ureter, unspecified: Secondary | ICD-10-CM

## 2023-06-28 DIAGNOSIS — R7303 Prediabetes: Secondary | ICD-10-CM | POA: Diagnosis not present

## 2023-06-28 DIAGNOSIS — R911 Solitary pulmonary nodule: Secondary | ICD-10-CM

## 2023-06-28 DIAGNOSIS — Z86711 Personal history of pulmonary embolism: Secondary | ICD-10-CM

## 2023-06-28 DIAGNOSIS — L989 Disorder of the skin and subcutaneous tissue, unspecified: Secondary | ICD-10-CM

## 2023-06-28 LAB — BASIC METABOLIC PANEL
BUN: 17 mg/dL (ref 6–23)
CO2: 25 mEq/L (ref 19–32)
Calcium: 9.4 mg/dL (ref 8.4–10.5)
Chloride: 104 mEq/L (ref 96–112)
Creatinine, Ser: 0.97 mg/dL (ref 0.40–1.50)
GFR: 75.65 mL/min (ref >60.00)
Glucose, Bld: 200 mg/dL — ABNORMAL HIGH (ref 70–99)
Potassium: 4.2 mEq/L (ref 3.5–5.1)
Sodium: 138 mEq/L (ref 135–145)

## 2023-06-28 LAB — HEMOGLOBIN A1C: Hgb A1c MFr Bld: 7.7 % — ABNORMAL HIGH (ref 4.6–6.5)

## 2023-06-28 LAB — CBC WITH DIFFERENTIAL/PLATELET
Basophils Absolute: 0.1 10*3/uL (ref 0.0–0.1)
Basophils Relative: 0.9 % (ref 0.0–3.0)
Eosinophils Absolute: 0.1 10*3/uL (ref 0.0–0.7)
Eosinophils Relative: 1.8 % (ref 0.0–5.0)
HCT: 51.1 % (ref 39.0–52.0)
Hemoglobin: 17.2 g/dL — ABNORMAL HIGH (ref 13.0–17.0)
Lymphocytes Relative: 31.2 % (ref 12.0–46.0)
Lymphs Abs: 1.8 10*3/uL (ref 0.7–4.0)
MCHC: 33.6 g/dL (ref 30.0–36.0)
MCV: 88.2 fl (ref 78.0–100.0)
Monocytes Absolute: 0.3 10*3/uL (ref 0.1–1.0)
Monocytes Relative: 5.7 % (ref 3.0–12.0)
Neutro Abs: 3.6 10*3/uL (ref 1.4–7.7)
Neutrophils Relative %: 60.4 % (ref 43.0–77.0)
Platelets: 195 10*3/uL (ref 150.0–400.0)
RBC: 5.8 Mil/uL (ref 4.22–5.81)
RDW: 14.2 % (ref 11.5–15.5)
WBC: 5.9 10*3/uL (ref 4.0–10.5)

## 2023-06-28 NOTE — Progress Notes (Signed)
OFFICE VISIT  06/28/2023  CC:  Chief Complaint  Patient presents with   Follow-up    6 month Rci. No other questions or concerns.    Patient is a 77 y.o. male who presents for 89-month follow-up left leg DVT and saddle pulmonary embolus, on Eliquis. A/P as of last visit: "1 acute saddle pulmonary embolism and acute (extensive) left leg DVT. Suspect due to relative immobility with a long car ride prior to symptoms. He is doing great.  Plan is to continue Eliquis for 6 months. He is active, plays pickle ball 3 days a week.  No restrictions at this time. He knows the signs/symptoms of bleeding to look for."  INTERIM HX: Esau feels well.  No shortness of breath, chest pain, or leg pain. His left leg is chronically swollen, mild. He has completed 6 mo of eliquis. He is playing pickle ball 3 days a week.  We discussed some incidental findings from his CT scan back in January of this year: Right renal cyst incompletely reviewed and right pulmonary nodule.    Wayne Lamb has a small pink nodule in the back of his neck that has bothered him for quite a while.  He asks that it be taken off today.  ROS as above, plus--> no fevers, no CP, no SOB, no wheezing, no cough, no dizziness, no HAs, no rashes, no melena/hematochezia.  No polyuria or polydipsia.  No myalgias or arthralgias.  No focal weakness, paresthesias, or tremors.  No acute vision or hearing abnormalities.  No dysuria or unusual/new urinary urgency or frequency.  No recent changes in lower legs. No n/v/d or abd pain.  No palpitations.    Past Medical History:  Diagnosis Date   BPH with obstruction/lower urinary tract symptoms    Cataract    Elevated PSA    MRI prostate was done 2016 to eval persistently rising PSAs (no biopsy had been done): this showed no sign of prostate ca.  Stable 07/2017, 01/2018, 01/2019, 12/2019, 05/2020.   Encounter for hepatitis C virus screening test for high risk patient    Done by [facility] 01/18/17    Hypercholesterolemia    Leg DVT (deep venous thromboembolism), acute, left ([facility])    11/2022   Lumbar spondylosis    MRI L spine 2005: severe facet deg, SI deg   Obesity, Class II, BMI 35-39.9    OSA on CPAP    compliant   Prostate nodule 2016   prostate nodule or ridge in R prostate lobe--w/u reassuring 2016    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   Sleep apnea    Tubular adenoma of colon 2014   recommend repeat colonoscopy 2019    Past Surgical History:  Procedure Laterality Date   FRACTURE SURGERY     INGUINAL HERNIA REPAIR Left    right clavicle fracture     pin inserted (Dr. Renae Fickle)   TONSILLECTOMY     TRANSTHORACIC ECHOCARDIOGRAM     12/22/22 normal except grd I DD    Outpatient Medications Prior to Visit  Medication Sig Dispense Refill   apixaban (ELIQUIS) 5 MG TABS tablet Take 1 tablet (5 mg total) by mouth 2 (two) times daily. 180 tablet 1   Boswellia-Glucosamine-Vit D (OSTEO BI-FLEX ONE PER DAY PO) Take 1 tablet by mouth daily.     NON FORMULARY 1 capsule by Other route See admin instructions. Inflamma-X capsules- Take 1 capsule by mouth every other day  Ascorbic Acid (VITAMIN C PO) Take 1 tablet by mouth daily.     Cholecalciferol (VITAMIN D3 PO) Take 1 capsule by mouth daily.     No facility-administered medications prior to visit.    No Known Allergies  Review of Systems As per HPI  PE:    06/28/2023    8:44 AM 01/25/2023   11:38 AM 12/27/2022   11:07 AM  Vitals with BMI  Height  5\' 9"  5\' 9"   Weight 224 lbs 13 oz 223 lbs 10 oz 218 lbs 10 oz  BMI  33 32.27  Systolic 135 120 161  Diastolic 81 76 83  Pulse 68 66 76     Physical Exam  Gen: Alert, well appearing.  Patient is oriented to person, place, time, and situation. AFFECT: pleasant, lucid thought and speech. LEGS: L leg 2-3+ pitting edema, w/out erythema or signif pigment changes.  No varicosities or tenderness. Circ of R calf 10 cm below the inferior border of the  patella is 37 cm. Left side is 39 cm. Posterior aspect of the neck a couple centimeters down from the hairline there is a 2 mm pink nodule.  LABS:  Last CBC Lab Results  Component Value Date   WBC 5.1 12/23/2022   HGB 16.1 12/23/2022   HCT 45.9 12/23/2022   MCV 85.5 12/23/2022   MCH 30.0 12/23/2022   RDW 12.9 12/23/2022   PLT 192 12/23/2022   Last metabolic panel Lab Results  Component Value Date   GLUCOSE 157 (H) 12/20/2022   NA 137 12/20/2022   K 4.5 12/20/2022   CL 99 12/20/2022   CO2 27 12/20/2022   BUN 16 12/20/2022   CREATININE 1.03 12/20/2022   GFRNONAA >60 12/20/2022   CALCIUM 9.3 12/20/2022   PROT 7.5 12/20/2022   ALBUMIN 3.9 12/20/2022   BILITOT 0.9 12/20/2022   ALKPHOS 94 12/20/2022   AST 20 12/20/2022   ALT 22 12/20/2022   ANIONGAP 11 12/20/2022   Last lipids Lab Results  Component Value Date   CHOL 236 (H) 08/23/2019   HDL 51.10 08/23/2019   LDLCALC 152 (H) 08/23/2019   LDLDIRECT 159.0 08/16/2017   TRIG 164.0 (H) 08/23/2019   CHOLHDL 5 08/23/2019   Last hemoglobin A1c Lab Results  Component Value Date   HGBA1C 6.2 08/23/2019   Last thyroid functions Lab Results  Component Value Date   TSH 1.62 08/17/2018   IMPRESSION AND PLAN:  #1 left leg DVT, large pulmonary embolus.  He is status post 6 months of Eliquis.  He just refilled a months worth of medicine and he will finish this out and then discontinue this medicine. Monitor CBC today.  #2 prediabetes. Met and hemoglobin A1c today.  3.  Right renal cyst, incompletely viewed on CT chest angio in January 2024. Will do renal ultrasound--ordered today.  4.  Right lung pulmonary nodule, incidentally detected on CT chest angio January 2024. Will do noncontrast CT chest to follow this up.  #5 skin nodule on neck. Rule out squamous cell carcinoma versus basal cell carcinoma. Excised today.  Procedure: skin lesion removal.  Consent obtained.  Area prepped and draped after being infiltrated  with 1/2 cc of 1% lido with epi.  Sterile technique utilized to excise the lesion entirely at its base by using dermablade excision technique.  Small remaining divot, no suture required.  No immediate complications.  Pt tolerated procedure well. Specimen sent to pathology.  Wound care instructions discussed.  An After Visit Summary was printed  and given to the patient.  FOLLOW UP: Return in about 6 months (around 12/29/2023) for routine chronic illness f/u.  Signed:  Santiago Bumpers, MD           06/28/2023

## 2023-06-29 ENCOUNTER — Encounter: Payer: Self-pay | Admitting: Family Medicine

## 2023-06-29 ENCOUNTER — Other Ambulatory Visit: Payer: Self-pay | Admitting: Family Medicine

## 2023-06-29 DIAGNOSIS — E119 Type 2 diabetes mellitus without complications: Secondary | ICD-10-CM

## 2023-07-03 ENCOUNTER — Ambulatory Visit (HOSPITAL_BASED_OUTPATIENT_CLINIC_OR_DEPARTMENT_OTHER)
Admission: RE | Admit: 2023-07-03 | Discharge: 2023-07-03 | Disposition: A | Discharge status: Home / Self Care | Payer: PPO | Source: Ambulatory Visit | Attending: Family Medicine | Admitting: Family Medicine

## 2023-07-03 DIAGNOSIS — R918 Other nonspecific abnormal finding of lung field: Secondary | ICD-10-CM | POA: Diagnosis not present

## 2023-07-03 DIAGNOSIS — Z86711 Personal history of pulmonary embolism: Secondary | ICD-10-CM | POA: Insufficient documentation

## 2023-07-03 DIAGNOSIS — N281 Cyst of kidney, acquired: Secondary | ICD-10-CM | POA: Diagnosis not present

## 2023-07-03 DIAGNOSIS — N289 Disorder of kidney and ureter, unspecified: Secondary | ICD-10-CM | POA: Insufficient documentation

## 2023-07-03 DIAGNOSIS — K449 Diaphragmatic hernia without obstruction or gangrene: Secondary | ICD-10-CM | POA: Diagnosis not present

## 2023-07-03 DIAGNOSIS — Z85038 Personal history of other malignant neoplasm of large intestine: Secondary | ICD-10-CM | POA: Diagnosis not present

## 2023-07-04 ENCOUNTER — Encounter: Payer: Self-pay | Admitting: Dietician

## 2023-07-04 ENCOUNTER — Encounter: Payer: PPO | Attending: Family Medicine | Admitting: Dietician

## 2023-07-04 VITALS — Wt 227.4 lb

## 2023-07-04 DIAGNOSIS — E119 Type 2 diabetes mellitus without complications: Secondary | ICD-10-CM | POA: Insufficient documentation

## 2023-07-04 NOTE — Progress Notes (Signed)
Patient was seen on 07/04/23 for the first of a series of three diabetes self-management courses at [facility].  Patient Education Plan per assessed needs and concerns is to attend three course education program for Diabetes Self Management Education.  A1C was 7.7 on 06/28/23.  The following learning objectives were met by the patient during this class: Describe diabetes, types of diabetes and pathophysiology State some common risk factors for diabetes Defines the role of glucose and insulin Describe the relationship between diabetes and cardiovascular and other risks State the members of the Healthcare Team States the rationale for glucose monitoring and when to test State their individual Target Range State the importance of logging glucose readings and how to interpret the readings Identifies A1C target Explain the correlation between A1c and eAG values State symptoms and treatment of high blood glucose and low blood glucose Explain proper technique for glucose testing and identify proper sharps disposal  Handouts given during class include: How to Thrive:  A Guide for Your Journey with Diabetes by the ADA Meal Plan Card and carbohydrate content list Dietary intake form Low Sodium Flavoring Tips Types of Fats Dining Out Label reading Snack list The diabetes portion plate Diabetes Resources A1c to eAG Conversion Chart Blood Glucose Log Diabetes Recommended Care Schedule Support Group Diabetes Success Plan Core Class Satisfaction Survey   Follow-Up Plan: Attend core 2

## 2023-07-07 ENCOUNTER — Encounter: Payer: Self-pay | Admitting: Family Medicine

## 2023-07-11 ENCOUNTER — Encounter: Payer: Self-pay | Admitting: Dietician

## 2023-07-11 ENCOUNTER — Encounter: Payer: PPO | Admitting: Dietician

## 2023-07-11 DIAGNOSIS — E119 Type 2 diabetes mellitus without complications: Secondary | ICD-10-CM

## 2023-07-11 NOTE — Progress Notes (Signed)
Patient was seen on 07/11/23 for the second of a series of three diabetes self-management courses at the [facility]. The following learning objectives were met by the patient during this class:  Describe the role of different macronutrients on glucose Explain how carbohydrates affect blood glucose State what foods contain the most carbohydrates Demonstrate carbohydrate counting Demonstrate how to read Nutrition Facts food label Describe effects of various fats on heart health Describe the importance of good nutrition for health and healthy eating strategies Describe techniques for managing your shopping, cooking and meal planning List strategies to follow meal plan when dining out Describe the effects of alcohol on glucose and how to use it safely  Goals:  Follow Diabetes Meal Plan as instructed  Aim to spread carbs evenly throughout the day  Aim for 3 meals per day and snacks as needed Include lean protein foods to meals/snacks  Monitor glucose levels as instructed by your doctor   Follow-Up Plan: Attend Core 3 Work towards following your personal food plan.

## 2023-07-18 ENCOUNTER — Encounter: Payer: Self-pay | Admitting: Dietician

## 2023-07-18 ENCOUNTER — Encounter: Payer: PPO | Admitting: Dietician

## 2023-07-18 DIAGNOSIS — E119 Type 2 diabetes mellitus without complications: Secondary | ICD-10-CM

## 2023-07-18 NOTE — Progress Notes (Signed)
Patient was seen on 07/18/23 for the third of a series of three diabetes self-management courses at the [facility].   State the amount of activity recommended for healthy living Describe activities suitable for individual needs Identify ways to regularly incorporate activity into daily life Identify barriers to activity and ways to over come these barriers Identify diabetes medications being personally used and their primary action for lowering glucose and possible side effects Describe role of stress on blood glucose and develop strategies to address psychosocial issues Identify diabetes complications and ways to prevent them Explain how to manage diabetes during illness Evaluate success in meeting personal goal Establish 2-3 goals that they will plan to diligently work on  Goals:  I will be active 60 minutes or more 3 times a week I will eat less unhealthy fats by eating less saturated fats Count carbohydrates at most meals and snacks  Your patient has identified these potential barriers to change:  Motivation Finances Stress Lack of Family Support  Your patient has identified their diabetes self-care support plan as  Family Education officer, environmental Resources [facility] Support [facility]  American Diabetes Association Website    Plan:  Attend Support Group as desired

## 2023-08-01 DIAGNOSIS — H02831 Dermatochalasis of right upper eyelid: Secondary | ICD-10-CM | POA: Diagnosis not present

## 2023-08-01 DIAGNOSIS — H2513 Age-related nuclear cataract, bilateral: Secondary | ICD-10-CM | POA: Diagnosis not present

## 2023-08-01 DIAGNOSIS — E119 Type 2 diabetes mellitus without complications: Secondary | ICD-10-CM | POA: Diagnosis not present

## 2023-08-01 DIAGNOSIS — H04123 Dry eye syndrome of bilateral lacrimal glands: Secondary | ICD-10-CM | POA: Diagnosis not present

## 2023-08-01 DIAGNOSIS — H40012 Open angle with borderline findings, low risk, left eye: Secondary | ICD-10-CM | POA: Diagnosis not present

## 2023-08-01 DIAGNOSIS — H10413 Chronic giant papillary conjunctivitis, bilateral: Secondary | ICD-10-CM | POA: Diagnosis not present

## 2023-08-01 DIAGNOSIS — H02834 Dermatochalasis of left upper eyelid: Secondary | ICD-10-CM | POA: Diagnosis not present

## 2023-08-01 DIAGNOSIS — H43812 Vitreous degeneration, left eye: Secondary | ICD-10-CM | POA: Diagnosis not present

## 2023-08-01 DIAGNOSIS — H40033 Anatomical narrow angle, bilateral: Secondary | ICD-10-CM | POA: Diagnosis not present

## 2023-08-01 LAB — HM DIABETES EYE EXAM

## 2023-08-02 ENCOUNTER — Encounter: Payer: Self-pay | Admitting: Family Medicine

## 2023-08-07 ENCOUNTER — Encounter: Payer: Self-pay | Admitting: Family Medicine

## 2023-08-10 ENCOUNTER — Encounter: Payer: Self-pay | Admitting: Family Medicine

## 2023-08-10 ENCOUNTER — Ambulatory Visit (INDEPENDENT_AMBULATORY_CARE_PROVIDER_SITE_OTHER): Payer: PPO | Admitting: Family Medicine

## 2023-08-10 VITALS — BP 142/80 | HR 77 | Temp 98.1°F | Wt 222.6 lb

## 2023-08-10 DIAGNOSIS — B86 Scabies: Secondary | ICD-10-CM | POA: Diagnosis not present

## 2023-08-10 DIAGNOSIS — L03114 Cellulitis of left upper limb: Secondary | ICD-10-CM | POA: Diagnosis not present

## 2023-08-10 DIAGNOSIS — R21 Rash and other nonspecific skin eruption: Secondary | ICD-10-CM | POA: Diagnosis not present

## 2023-08-10 MED ORDER — PERMETHRIN 5 % EX CREA: TOPICAL_CREAM | CUTANEOUS | 1 refills | Status: DC

## 2023-08-10 MED ORDER — CEPHALEXIN 500 MG PO CAPS: ORAL_CAPSULE | ORAL | 0 refills | Status: DC

## 2023-08-10 MED ORDER — HYDROXYZINE PAMOATE 50 MG PO CAPS: ORAL_CAPSULE | ORAL | 1 refills | Status: DC

## 2023-08-10 NOTE — Patient Instructions (Signed)
Scabies, Adult  Scabies is a skin condition that happens when very small insects called mites get under your skin. This causes severe itchiness and a rash that looks like pimples. Scabies is contagious. This means it can spread easily from person to person. If you get scabies, the people you live with may get it too. With the right treatment, symptoms often go away in 2-4 weeks. In most cases, scabies does not cause lasting problems. What are the causes? Scabies is caused by tiny mites (Sarcoptes scabiei) that can only be seen with a microscope. The mites get into the top layer of your skin and lay eggs. This is called an infestation. You may get scabies if: You have close contact with someone who has scabies. You come in contact with items that have the mites on them. These may include towels, bedding, or clothes. What increases the risk? You may be more likely to get scabies if: You live in a nursing home or extended care facility. You spend time in a place where a lot of people live close together, such as a shelter or prison. You have sex with a partner who has scabies. You care for others who are at risk for scabies. What are the signs or symptoms? Symptoms of scabies include: A rash that looks like pimples. It may include tiny red bumps or blisters. It is often found in the skinfolds or on the hands, wrists, elbows, armpits, chest, waist, groin, or buttocks. Severe itchiness. This is often worse at night. Skin irritation. This can include scaly patches or sores. The bumps from scabies may form a line (burrow) on the skin. The line may look thin, crooked, and grayish-white or skin colored. How is this diagnosed? Scabies may be diagnosed based on a physical exam of your skin. You may also have a skin test done. A sample of your skin may be taken (skin scraping) and looked at under a microscope for signs of mites. How is this treated? Scabies may be treated with: Medicated creams or  lotions to kill the mites. The cream or lotion is spread on your whole body and left for a few hours. In most cases, one treatment is enough to kill all the mites. In severe cases, the treatment may need to be done more than once. Medicated cream to help with the itching. Medicines taken by mouth (orally). These may help: Relieve itching. Reduce the swelling and redness. Kill the mites. This treatment may be used in severe cases. Follow these instructions at home: Medicines Take or apply over-the-counter and prescription medicines only as told by your health care provider. Apply medicated cream or lotion as told by your provider. Do not wash off the medicated cream or lotion until enough time has passed or as told by your provider. Skin care Try not to scratch or pick at the affected areas of your skin. Keep your fingernails closely trimmed. This can help reduce injury from scratching. Take cool baths or apply cool, wet cloths to your skin. This can help reduce itching. General instructions Clean all items that you touched in the 3 days before you were diagnosed. This includes bedding, clothes, towels, and furniture. Do this on the same day that you start treatment. Dry-clean items or use hot water to wash them. Dry them on the hot dry cycle. Place items that cannot be washed into closed, airtight plastic bags for at least 3 days. The mites cannot live for more than 3 days away from  human skin. Vacuum your furniture and mattresses. Make sure that other people who may have been infested see a provider. Where to find more information Centers for Disease Control and Prevention (CDC): [employer]no Contact a health care provider if: You have itching that does not go away after 4 weeks of treatment. You keep getting new bumps or burrows. You have redness, swelling, or pain near your rash after treatment. You have fluid, blood, or pus coming from your rash. You get thick crusts or scaly patches over  large areas of your skin. You have a fever. This information is not intended to replace advice given to you by your health care provider. Make sure you discuss any questions you have with your health care provider. Document Revised: 08/22/2022 Document Reviewed: 08/22/2022 Elsevier Patient Education  2024 [employer].

## 2023-08-10 NOTE — Progress Notes (Signed)
OFFICE VISIT  08/10/2023  CC:  Chief Complaint  Patient presents with   Rash    Scabies treatment    Patient is a 77 y.o. male who presents for skin lesions.  HPI: He noted about 5 days ago some pink bumps on his arms.  Very itchy.  Began to turn more red and several more bumps appeared near the wrist. Wife diagnosed with scabies recently.  He is otherwise feeling well: No fever, chills, malaise, joint aches, headaches, or muscle aches.  Past Medical History:  Diagnosis Date   BPH with obstruction/lower urinary tract symptoms    Cataract    Diabetes mellitus without complication (HCC)    Dx 05/2023-->fasting gluc 200, Hba1c 7.7%   Diverticulosis    2014 colonoscopy   Elevated PSA    MRI prostate was done 2016 to eval persistently rising PSAs (no biopsy had been done): this showed no sign of prostate ca.  Stable 07/2017, 01/2018, 01/2019, 12/2019, 05/2020.   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  +diverticulosis and int hem   FRACTURE SURGERY     INGUINAL HERNIA REPAIR Left    right clavicle fracture     pin inserted (Dr. Renae Fickle)   TONSILLECTOMY     TRANSTHORACIC ECHOCARDIOGRAM     12/22/22 normal except grd I DD    Outpatient Medications Prior to Visit  Medication Sig Dispense Refill   Boswellia-Glucosamine-Vit D (OSTEO BI-FLEX ONE PER DAY PO) Take 1 tablet by  mouth daily.     NON FORMULARY 1 capsule by Other route See admin instructions. Inflamma-X capsules- Take 1 capsule by mouth every other day     No facility-administered medications prior to visit.    No Known Allergies  Review of Systems  As per HPI  PE:    08/10/2023    1:27 PM 07/04/2023    2:43 PM 06/28/2023    8:44 AM  Vitals with BMI  Weight 222 lbs 10 oz 227 lbs 6 oz 224 lbs 13 oz  Systolic 142  135  Diastolic 80  81  Pulse 77  68     Physical Exam  Gen: Alert, well appearing.  Patient is oriented to person, place, time, and situation. Skin: He has approximately a dozen scattered pinkish to reddish papular lesions on the forearms.  There is a group on the left forearm that has some surrounding erythema.  LABS:  Last CBC Lab Results  Component Value Date   WBC 5.9 06/28/2023   HGB 17.2 (H) 06/28/2023   HCT 51.1 06/28/2023   MCV 88.2 06/28/2023   MCH 30.0 12/23/2022   RDW 14.2 06/28/2023   PLT 195.0 06/28/2023   Last metabolic panel Lab Results  Component Value Date  GLUCOSE 200 (H) 06/28/2023   NA 138 06/28/2023   K 4.2 06/28/2023   CL 104 06/28/2023   CO2 25 06/28/2023   BUN 17 06/28/2023   CREATININE 0.97 06/28/2023   GFR 75.65 06/28/2023   CALCIUM 9.4 06/28/2023   PROT 7.5 12/20/2022   ALBUMIN 3.9 12/20/2022   BILITOT 0.9 12/20/2022   ALKPHOS 94 12/20/2022   AST 20 12/20/2022   ALT 22 12/20/2022   ANIONGAP 11 12/20/2022   Lab Results  Component Value Date   HGBA1C 7.7 (H) 06/28/2023   IMPRESSION AND PLAN:  #1 pruritic papules, suspect scabies. Elimite 5% cream, apply head to toe, wash off in 8 hours, repeat in 7 days. Information about eradication of the mites from clothes, etc. was reviewed today. I think the area on the left forearm may be getting some secondary cellulitis. Keflex 500 mg 3 times daily x 10 days prescribed. An After Visit Summary was printed and given to the patient.  FOLLOW UP: No follow-ups on file.  Signed:  Santiago Bumpers, MD           08/10/2023

## 2023-08-11 NOTE — Telephone Encounter (Signed)
No further action needed.

## 2023-08-14 ENCOUNTER — Encounter: Payer: Self-pay | Admitting: Family Medicine

## 2023-08-14 NOTE — Telephone Encounter (Signed)
Front staff is working on scheduling patient as acute

## 2023-08-14 NOTE — Telephone Encounter (Signed)
Please arrange for Wayne Lamb's wife to see me as an acute visit at 4:00 today.

## 2023-08-17 ENCOUNTER — Ambulatory Visit: Payer: PPO | Admitting: Family Medicine

## 2023-09-06 ENCOUNTER — Other Ambulatory Visit: Payer: Self-pay | Admitting: Family Medicine

## 2023-09-07 ENCOUNTER — Encounter: Payer: Self-pay | Admitting: Family Medicine

## 2023-09-14 NOTE — Progress Notes (Signed)
HPI male former smoker followed for OSA, rhinitis, complicated by obesity, DM2, HBP, GERD, Hypercholesterolemia,  NPSG 10/16/03- AHI (RDI) 51/ hr, desat to 83%, body weight 220 lbs  ---------------------------------------------------------------------- 09/12/22- 77 year old male former smoker followed for OSA, rhinitis, complicated by obesity, DM2, HBP, GERD, Hypercholesterolemia, BPH,  CPAP auto 5-20/[employer]   AirSense 10 AutoSet Download-compliance 97%, AHI 3.2/ hr Body weight today 227 lbs Covid vax-none Flu vax-declines Playing pickleball 3x/ week and feels well.Still not taking vaccines or prescription meds. Download reviewed  09/15/23- 77 year old male former smoker followed for OSA, rhinitis, complicated by obesity, DM2, HBP, GERD, Hypercholesterolemia, BPH, Provoked Pulmonary Embolism 2024,  CPAP auto 5-20/[employer]   AirSense 10 AutoSet Download-compliance 97%, AHI 1.7/hr Body weight today 224 lbs Doing well with CPAP, benefit from improved sleep. Now in billing dispute with [employer]-he has the cleared check but they keep billing. We gave him name and number to discuss directly with DME manager. Meanwhile, he is due for new machine and asks help switching to a new DME. Took Eliquis 7 months after PE (long car ride). Has persistent swelling L calf and uses elastic socks.  ROS-see HPI   + = positive Constitutional:   weight loss, night sweats, fevers, chills, fatigue, lassitude. HEENT:   No-  headaches, difficulty swallowing, tooth/dental problems, sore throat,       No-  sneezing, itching, ear ache, nasal congestion, post nasal drip,  CV:  No-   chest pain, orthopnea, PND, swelling in lower extremities, anasarca, dizziness, palpitations Resp: No-   shortness of breath with exertion or at rest.              No-   productive cough,  No non-productive cough,  No- coughing up of blood.              No-   change in color of mucus.  No- wheezing.   Skin: No-   rash or lesions. GI:  No-    heartburn, indigestion, abdominal pain, nausea, vomiting, GU:  MS:  No-   joint pain or swelling.  Neuro-     nothing unusual Psych:  No- change in mood or affect. No depression or anxiety.  No memory loss.  OBJ- Physical Exam General- Alert, Oriented, Affect-appropriate, Distress- none acute,  +overweight Skin- rash-none, lesions- none, excoriation- none Lymphadenopathy- none Head- atraumatic            Eyes- Gross vision intact, PERRLA, conjunctivae and secretions clear            Ears- Hearing, canals-normal            Nose- Clear, no-Septal dev, mucus, polyps, erosion, perforation             Throat- Mallampati II-III , mucosa clear , drainage- none, tonsils- atrophic Neck- flexible , trachea midline, no stridor , thyroid nl, carotid no bruit Chest - symmetrical excursion , unlabored           Heart/CV- RRR , no murmur , no gallop  , no rub, nl s1 s2                           - JVD- none , edema- none, stasis changes- none, varices- none           Lung- clear to P&A, wheeze- none, cough- none , dullness-none, rub- none           Chest wall-  Abd- Br/ Gen/ Rectal- Not done, not  indicated Extrem- cyanosis- none, clubbing, none, atrophy- none, strength- nl Neuro- grossly intact to observation

## 2023-09-15 ENCOUNTER — Encounter: Payer: Self-pay | Admitting: Internal Medicine

## 2023-09-15 ENCOUNTER — Ambulatory Visit: Payer: PPO | Admitting: Internal Medicine

## 2023-09-15 VITALS — BP 160/78 | HR 64 | Ht 68.0 in | Wt 224.0 lb

## 2023-09-15 DIAGNOSIS — G4733 Obstructive sleep apnea (adult) (pediatric): Secondary | ICD-10-CM

## 2023-09-15 DIAGNOSIS — I82402 Acute embolism and thrombosis of unspecified deep veins of left lower extremity: Secondary | ICD-10-CM | POA: Insufficient documentation

## 2023-09-15 DIAGNOSIS — E669 Obesity, unspecified: Secondary | ICD-10-CM | POA: Diagnosis not present

## 2023-09-15 NOTE — Assessment & Plan Note (Signed)
Persistent edema L calf, probably reflects venous valve damage by DVT.  Plan- PCP will consider whether f/u doppler indicated, looking for residual clot. Continue wearing elastic socks, elevate when able and regard this as a long term risk for recurrent DVT.

## 2023-09-15 NOTE — Assessment & Plan Note (Deleted)
Morbidly obese. He says he is losing weight, but has a long way to go.

## 2023-09-15 NOTE — Assessment & Plan Note (Addendum)
Benefits from CPAP with good compliance and control Plan- Replace old CPAP and change DME at his request. We also gave him contact info so he could speak with Risk analyst.

## 2023-09-15 NOTE — Patient Instructions (Signed)
Order- [facility] please change DME from [employer], patient request. Please replace old CPAP machine, auto 5-15, mask of choice, humidifier, supplies and install download capability AirView/ card  Please call if I can help

## 2023-09-18 ENCOUNTER — Telehealth: Payer: Self-pay | Admitting: Internal Medicine

## 2023-09-18 NOTE — Telephone Encounter (Signed)
Benefits from CPAP with good compliance and control Plan- Replace old CPAP and change DME at his request. We also gave him contact info so he could speak with Risk analyst.      This is documented at bottom of ov note. Is there something else needed in documentation.

## 2023-09-18 NOTE — Telephone Encounter (Signed)
Zott, Penni Bombard, [address]; Zott, Stacy; Anton Ruiz, [place], in order for insurance to pay for a replacement cpap the office notes need to state that he is using and benefiting from cpap, if possible could there be an addendum made to the notes to state this, I also need a copy of the diagnostic testing the initiated cpap use if this not available he will need new testing. Please advise. Thank You

## 2023-10-02 ENCOUNTER — Encounter: Payer: Self-pay | Admitting: Internal Medicine

## 2024-01-01 ENCOUNTER — Encounter: Payer: Self-pay | Admitting: Family Medicine

## 2024-01-01 ENCOUNTER — Ambulatory Visit: Payer: PPO | Admitting: Family Medicine

## 2024-01-01 VITALS — BP 136/88 | HR 75 | Wt 214.6 lb

## 2024-01-01 DIAGNOSIS — E119 Type 2 diabetes mellitus without complications: Secondary | ICD-10-CM

## 2024-01-01 DIAGNOSIS — R972 Elevated prostate specific antigen [PSA]: Secondary | ICD-10-CM | POA: Diagnosis not present

## 2024-01-01 DIAGNOSIS — R918 Other nonspecific abnormal finding of lung field: Secondary | ICD-10-CM

## 2024-01-01 LAB — POCT GLYCOSYLATED HEMOGLOBIN (HGB A1C)
HbA1c POC (<> result, manual entry): 9 % (ref 4.0–5.6)
HbA1c, POC (controlled diabetic range): 9 % — AB (ref 0.0–7.0)
HbA1c, POC (prediabetic range): 9 % — AB (ref 5.7–6.4)
Hemoglobin A1C: 9 % — AB (ref 4.0–5.6)

## 2024-01-01 NOTE — Progress Notes (Signed)
 OFFICE VISIT  02/04/2024  CC:  Chief Complaint  Patient presents with   Diabetes    Pt is fasting.     Patient is a 78 y.o. male who presents for follow-up diabetes and lung nodules. A/P as of last visit: "#1 left leg DVT, large pulmonary embolus.  He is status post 6 months of Eliquis.  He just refilled a months worth of medicine and he will finish this out and then discontinue this medicine. Monitor CBC today.   #2 prediabetes. CMet and hemoglobin A1c today.   3.  Right renal cyst, incompletely viewed on CT chest angio in January 2024. Will do renal ultrasound--ordered today.   4.  Right lung pulmonary nodule, incidentally detected on CT chest angio January 2024. Will do noncontrast CT chest to follow this up."  INTERIM HX:  Feeling fine.  Renal ultrasound after last visit showed a simple cyst, no further imaging is indicated. Unfortunately his hemoglobin A1c came back at 7.7% last visit.  Nutritionist eval, dietary/TLC.  No meds at that time. He has made really good improvement in his diet.  He remains active playing pickle ball several days a week.  Review of systems: No polyuria, no polydipsia, no chest pain, no cough, no fever, no shortness of breath. Feet without burning, tingling, or numbness.  Past Medical History:  Diagnosis Date   BPH with obstruction/lower urinary tract symptoms    Cataract    Diabetes mellitus without complication (HCC)    Dx 05/2023-->fasting gluc 200, Hba1c 7.7%   Diverticulosis    2014 colonoscopy   Elevated PSA    MRI prostate was done 2016 to eval persistently rising PSAs (no biopsy had been done): this showed no sign of prostate ca.  Stable 07/2017, 01/2018, 01/2019, 12/2019, 05/2020.   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  +diverticulosis and int hem   FRACTURE SURGERY     INGUINAL HERNIA REPAIR Left    right clavicle fracture     pin inserted (Dr. Renae Fickle)   TONSILLECTOMY     TRANSTHORACIC ECHOCARDIOGRAM     12/22/22 normal except grd I DD    Outpatient Medications Prior to Visit  Medication Sig Dispense Refill   ascorbic acid (VITAMIN C) 500 MG tablet Take 500 mg by mouth daily.     Boswellia-Glucosamine-Vit D (OSTEO BI-FLEX ONE PER DAY PO) Take 1 tablet by mouth daily.     zinc gluconate 50 MG tablet Take 50 mg by mouth daily.     cephALEXin (KEFLEX) 500 MG capsule 1 tab po tid x 10d (Patient not taking: Reported on 01/01/2024) 30 capsule 0   hydrOXYzine (VISTARIL) 50 MG capsule TAKE 1-2 TABLETS BY MOUTH TWICE DAILY AS NEEDED FOR ITCHING (Patient  not taking: Reported on 01/01/2024) 60 capsule 3   NON FORMULARY 1 capsule by Other route See admin instructions. Inflamma-X capsules- Take 1 capsule by mouth every other day (Patient not taking: Reported on 09/15/2023)     permethrin (ELIMITE) 5 % cream Apply from top of head to soles of feet and wash off after 8 hours.  Repeat in 7d (Patient not taking: Reported on 01/01/2024) 120 g 1   No facility-administered medications prior to visit.    No Known Allergies  Review of Systems As per HPI  PE:    01/01/2024    9:07 AM 01/01/2024    9:00 AM 09/15/2023   10:08 AM  Vitals with BMI  Weight  214 lbs 10 oz   Systolic 136 140 409  Diastolic 88 92 78  Pulse  75      Physical Exam  Gen: Alert, well appearing.  Patient is oriented to person, place, time, and  situation. AFFECT: pleasant, lucid thought and speech. Foot exam - no swelling, tenderness or skin or vascular lesions. Color and temperature is normal. Sensation is intact. Peripheral pulses are palpable. Toenails are normal.  LABS:  Last CBC Lab Results  Component Value Date   WBC 5.9 06/28/2023   HGB 17.2 (H) 06/28/2023   HCT 51.1 06/28/2023   MCV 88.2 06/28/2023   MCH 30.0 12/23/2022   RDW 14.2 06/28/2023   PLT 195.0 06/28/2023   Last metabolic panel Lab Results  Component Value Date   GLUCOSE 200 (H) 06/28/2023   NA 138 06/28/2023   K 4.2 06/28/2023   CL 104 06/28/2023   CO2 25 06/28/2023   BUN 17 06/28/2023   CREATININE 0.97 06/28/2023   GFR 75.65 06/28/2023   CALCIUM 9.4 06/28/2023   PROT 7.5 12/20/2022   ALBUMIN 3.9 12/20/2022   BILITOT 0.9 12/20/2022   ALKPHOS 94 12/20/2022   AST 20 12/20/2022   ALT 22 12/20/2022   ANIONGAP 11 12/20/2022   Last lipids Lab Results  Component Value Date   CHOL 236 (H) 08/23/2019   HDL 51.10 08/23/2019   LDLCALC 152 (H) 08/23/2019   LDLDIRECT 159.0 08/16/2017   TRIG 164.0 (H) 08/23/2019   CHOLHDL 5 08/23/2019   Last hemoglobin A1c Lab Results  Component Value Date   HGBA1C 9.0 (A) 01/01/2024   HGBA1C 9.0 01/01/2024   HGBA1C 9.0 (A) 01/01/2024   HGBA1C 9.0 (A) 01/01/2024   Last thyroid functions Lab Results  Component Value Date   TSH 1.62 08/17/2018   Lab Results  Component Value Date   PSA 7.37 06/26/2020   PSA 6.68 12/27/2019   PSA 7.77 (H) 08/23/2019   IMPRESSION AND PLAN:  #1 diabetes without complication. He has made great dietary changes but unfortunately his A1c has continued to rise--> it is 9.0% today.  #2 Pulm nodules:  November 30, 2022 CT angio chest picked up incidental right lower lobe nodule 2.1 x 1.6 cm.  CT chest nodule follow-up on 07/03/2023 showed th right lower lobe nodule had shrunk.  However, a new 8 mm right lower lobe nodule and a new left upper lobe 6 mm nodule were detected.  Patient  is considered low risk for lung cancer therefore repeat CT chest nodule follow-up will be done in 6 months-->ordered today.  #3 Hx elevated PSAs. Patient elects to get no further PSAs.  An After Visit Summary was printed and given to the patient.  Spent 31 min with pt today reviewing HPI, reviewing relevant past history, doing  exam, reviewing and discussing lab and imaging data, and formulating plans.  FOLLOW UP: Return in about 3 months (around 03/30/2024) for routine chronic illness f/u.  Signed:  Santiago Bumpers, MD           02/04/2024

## 2024-01-09 ENCOUNTER — Encounter: Payer: Self-pay | Admitting: Family Medicine

## 2024-01-31 ENCOUNTER — Ambulatory Visit: Payer: PPO | Admitting: *Deleted

## 2024-01-31 DIAGNOSIS — Z0000 Encounter for general adult medical examination without abnormal findings: Secondary | ICD-10-CM | POA: Diagnosis not present

## 2024-01-31 NOTE — Patient Instructions (Signed)
 Wayne Lamb , Thank you for taking time to come for your Medicare Wellness Visit. I appreciate your ongoing commitment to your health goals. Please review the following plan we discussed and let me know if I can assist you in the future.   Screening recommendations/referrals: Colonoscopy: no longer required Recommended yearly ophthalmology/optometry visit for glaucoma screening and checkup Recommended yearly dental visit for hygiene and checkup  Vaccinations: Influenza vaccine:  Pneumococcal vaccine: up to date Tdap vaccine: up to date     Preventive Care 65 Years and Older, Male Preventive care refers to lifestyle choices and visits with your health care provider that can promote health and wellness. What does preventive care include? A yearly physical exam. This is also called an annual well check. Dental exams once or twice a year. Routine eye exams. Ask your health care provider how often you should have your eyes checked. Personal lifestyle choices, including: Daily care of your teeth and gums. Regular physical activity. Eating a healthy diet. Avoiding tobacco and drug use. Limiting alcohol use. Practicing safe sex. Taking low doses of aspirin every day. Taking vitamin and mineral supplements as recommended by your health care provider. What happens during an annual well check? The services and screenings done by your health care provider during your annual well check will depend on your age, overall health, lifestyle risk factors, and family history of disease. Counseling  Your health care provider may ask you questions about your: Alcohol use. Tobacco use. Drug use. Emotional well-being. Home and relationship well-being. Sexual activity. Eating habits. History of falls. Memory and ability to understand (cognition). Work and work Astronomer. Screening  You may have the following tests or measurements: Height, weight, and BMI. Blood pressure. Lipid and cholesterol  levels. These may be checked every 5 years, or more frequently if you are over 55 years old. Skin check. Lung cancer screening. You may have this screening every year starting at age 61 if you have a 30-pack-year history of smoking and currently smoke or have quit within the past 15 years. Fecal occult blood test (FOBT) of the stool. You may have this test every year starting at age 41. Flexible sigmoidoscopy or colonoscopy. You may have a sigmoidoscopy every 5 years or a colonoscopy every 10 years starting at age 54. Prostate cancer screening. Recommendations will vary depending on your family history and other risks. Hepatitis C blood test. Hepatitis B blood test. Sexually transmitted disease (STD) testing. Diabetes screening. This is done by checking your blood sugar (glucose) after you have not eaten for a while (fasting). You may have this done every 1-3 years. Abdominal aortic aneurysm (AAA) screening. You may need this if you are a current or former smoker. Osteoporosis. You may be screened starting at age 38 if you are at high risk. Talk with your health care provider about your test results, treatment options, and if necessary, the need for more tests. Vaccines  Your health care provider may recommend certain vaccines, such as: Influenza vaccine. This is recommended every year. Tetanus, diphtheria, and acellular pertussis (Tdap, Td) vaccine. You may need a Td booster every 10 years. Zoster vaccine. You may need this after age 61. Pneumococcal 13-valent conjugate (PCV13) vaccine. One dose is recommended after age 22. Pneumococcal polysaccharide (PPSV23) vaccine. One dose is recommended after age 30. Talk to your health care provider about which screenings and vaccines you need and how often you need them. This information is not intended to replace advice given to you by your  health care provider. Make sure you discuss any questions you have with your health care provider. Document  Released: 12/11/2015 Document Revised: 08/03/2016 Document Reviewed: 09/15/2015 Elsevier Interactive Patient Education  2017 [employer].  Fall Prevention in the Home Falls can cause injuries. They can happen to people of all ages. There are many things you can do to make your home safe and to help prevent falls. What can I do on the outside of my home? Regularly fix the edges of walkways and driveways and fix any cracks. Remove anything that might make you trip as you walk through a door, such as a raised step or threshold. Trim any bushes or trees on the path to your home. Use bright outdoor lighting. Clear any walking paths of anything that might make someone trip, such as rocks or tools. Regularly check to see if handrails are loose or broken. Make sure that both sides of any steps have handrails. Any raised decks and porches should have guardrails on the edges. Have any leaves, snow, or ice cleared regularly. Use sand or salt on walking paths during winter. Clean up any spills in your garage right away. This includes oil or grease spills. What can I do in the bathroom? Use night lights. Install grab bars by the toilet and in the tub and shower. Do not use towel bars as grab bars. Use non-skid mats or decals in the tub or shower. If you need to sit down in the shower, use a plastic, non-slip stool. Keep the floor dry. Clean up any water that spills on the floor as soon as it happens. Remove soap buildup in the tub or shower regularly. Attach bath mats securely with double-sided non-slip rug tape. Do not have throw rugs and other things on the floor that can make you trip. What can I do in the bedroom? Use night lights. Make sure that you have a light by your bed that is easy to reach. Do not use any sheets or blankets that are too big for your bed. They should not hang down onto the floor. Have a firm chair that has side arms. You can use this for support while you get dressed. Do  not have throw rugs and other things on the floor that can make you trip. What can I do in the kitchen? Clean up any spills right away. Avoid walking on wet floors. Keep items that you use a lot in easy-to-reach places. If you need to reach something above you, use a strong step stool that has a grab bar. Keep electrical cords out of the way. Do not use floor polish or wax that makes floors slippery. If you must use wax, use non-skid floor wax. Do not have throw rugs and other things on the floor that can make you trip. What can I do with my stairs? Do not leave any items on the stairs. Make sure that there are handrails on both sides of the stairs and use them. Fix handrails that are broken or loose. Make sure that handrails are as long as the stairways. Check any carpeting to make sure that it is firmly attached to the stairs. Fix any carpet that is loose or worn. Avoid having throw rugs at the top or bottom of the stairs. If you do have throw rugs, attach them to the floor with carpet tape. Make sure that you have a light switch at the top of the stairs and the bottom of the stairs. If you do  not have them, ask someone to add them for you. What else can I do to help prevent falls? Wear shoes that: Do not have high heels. Have rubber bottoms. Are comfortable and fit you well. Are closed at the toe. Do not wear sandals. If you use a stepladder: Make sure that it is fully opened. Do not climb a closed stepladder. Make sure that both sides of the stepladder are locked into place. Ask someone to hold it for you, if possible. Clearly mark and make sure that you can see: Any grab bars or handrails. First and last steps. Where the edge of each step is. Use tools that help you move around (mobility aids) if they are needed. These include: Canes. Walkers. Scooters. Crutches. Turn on the lights when you go into a dark area. Replace any light bulbs as soon as they burn out. Set up your  furniture so you have a clear path. Avoid moving your furniture around. If any of your floors are uneven, fix them. If there are any pets around you, be aware of where they are. Review your medicines with your doctor. Some medicines can make you feel dizzy. This can increase your chance of falling. Ask your doctor what other things that you can do to help prevent falls. This information is not intended to replace advice given to you by your health care provider. Make sure you discuss any questions you have with your health care provider. Document Released: 09/10/2009 Document Revised: 04/21/2016 Document Reviewed: 12/19/2014 Elsevier Interactive Patient Education  2017 [employer].

## 2024-01-31 NOTE — Progress Notes (Signed)
 Subjective:   Wayne Lamb is a 78 y.o. male who presents for Medicare Annual/Subsequent preventive examination.  Visit Complete: Virtual I connected with  [name] on 01/31/24 by a audio enabled telemedicine application and verified that I am speaking with the correct person using two identifiers.  Patient Location: Home  Provider Location: Home Office  I discussed the limitations of evaluation and management by telemedicine. The patient expressed understanding and agreed to proceed.  Vital Signs: Because this visit was a virtual/telehealth visit, some criteria may be missing or patient reported. Any vitals not documented were not able to be obtained and vitals that have been documented are patient reported.  Cardiac Risk Factors include: advanced age (>4men, >29 women);diabetes mellitus;male gender     Objective:    There were no vitals filed for this visit. There is no height or weight on file to calculate BMI.     01/31/2024   11:17 AM 07/04/2023    2:44 PM 01/25/2023   11:27 AM 12/20/2022    4:54 PM 09/21/2018   10:09 AM 10/06/2017   11:59 AM 06/08/2016    4:26 PM  Advanced Directives  Does Patient Have a Medical Advance Directive? Yes Yes Yes No Yes Yes No  Type of Advance Directive Living will  Healthcare Power of [place];Living will  Living will;Healthcare Power of Attorney Living will   Does patient want to make changes to medical advance directive?  No - Patient declined No - Patient declined      Copy of Healthcare Power of Attorney in Chart?   No - copy requested  No - copy requested    Would patient like information on creating a medical advance directive?    No - Patient declined   No - patient declined information    Current Medications (verified) Outpatient Encounter Medications as of 01/31/2024  Medication Sig   ascorbic acid (VITAMIN C) 500 MG tablet Take 500 mg by mouth daily.   Boswellia-Glucosamine-Vit D (OSTEO BI-FLEX ONE PER DAY PO) Take 1 tablet  by mouth daily.   zinc gluconate 50 MG tablet Take 50 mg by mouth daily.   No facility-administered encounter medications on file as of 01/31/2024.    Allergies (verified) Patient has no known allergies.   History: Past Medical History:  Diagnosis Date   BPH with obstruction/lower urinary tract symptoms    Cataract    Diabetes mellitus without complication (HCC)    Dx 05/2023-->fasting gluc 200, Hba1c 7.7%   Diverticulosis    2014 colonoscopy   Elevated PSA    MRI prostate was done 2016 to eval persistently rising PSAs (no biopsy had been done): this showed no sign of prostate ca.  Stable 07/2017, 01/2018, 01/2019, 12/2019, 05/2020.   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  +diverticulosis and int hem   FRACTURE SURGERY     INGUINAL HERNIA REPAIR Left    right clavicle fracture     pin inserted (Dr. Renae Fickle)   TONSILLECTOMY     TRANSTHORACIC ECHOCARDIOGRAM     12/22/22 normal except grd I DD   Family History  Problem Relation Age of Onset   Alcohol abuse Father    Early death Father 36       unknown cause of death   Heart disease Mother    Heart disease Sister    Learning disabilities Brother        Dyslexia   Social History   Socioeconomic History   Marital status: Married    Spouse name: Not on file   Number of children: Not on file   Years of education: Not on  file   Highest education level: Bachelor's degree (e.g., BA, AB, BS)  Occupational History   Not on file  Tobacco Use   Smoking status: Former    Current packs/day: 0.00    Average packs/day: 1 pack/day for 15.0 years (15.0 ttl pk-yrs)    Types: Cigarettes    Start date: 04/19/1965    Quit date: 04/19/1980    Years since quitting: 43.8   Smokeless tobacco: Never  Vaping Use   Vaping status: Never Used  Substance and Sexual Activity   Alcohol use: Yes    Alcohol/week: 1.0 standard drink of alcohol    Types: 1 Shots of liquor per week    Comment: occ   Drug use: No   Sexual activity: Not Currently  Other Topics Concern   Not on file  Social History Narrative   Married, 1 son who is an Pensions consultant in [place].   HE GREW UP IN [place]---WENT TO [employer]!   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   Tob: former smoker, 15 pack-yr hx, quit 1970.   Alc: no   Exercise: 2-3 days per week, CV and wt training.   Social Drivers of Corporate investment banker Strain: Low Risk  (01/31/2024)   Overall Financial Resource Strain (CARDIA)    Difficulty of Paying Living Expenses: Not hard at all  Food Insecurity: No Food Insecurity (01/31/2024)   Hunger Vital Sign    Worried About Running Out of Food in the Last Year: Never true    Ran Out of Food in the Last Year: Never true  Transportation Needs: No Transportation Needs (01/31/2024)   PRAPARE - Administrator, Civil Service (Medical): No    Lack of Transportation (Non-Medical): No  Physical Activity: Sufficiently Active (01/31/2024)   Exercise Vital Sign    Days of Exercise per Week: 3 days    Minutes of Exercise per Session: 60 min  Stress: No Stress Concern Present (01/31/2024)   [employer] of Occupational Health - Occupational Stress Questionnaire    Feeling of Stress : Not at all  Social Connections: Socially Integrated (01/31/2024)   Social Connection and Isolation Panel [NHANES]    Frequency of  Communication with Friends and Family: More than three times a week    Frequency of Social Gatherings with Friends and Family: More than three times a week    Attends Religious Services: More than 4 times per year    Active Member of [employer] or Organizations: Yes    Attends Banker Meetings: More than 4  times per year    Marital Status: Married    Tobacco Counseling Counseling given: Not Answered   Clinical Intake:  Pre-visit preparation completed: Yes  Pain : No/denies pain     Diabetes: Yes CBG done?: No Did pt. bring in CBG monitor from home?: No  How often do you need to have someone help you when you read instructions, pamphlets, or other written materials from your doctor or pharmacy?: 1 - Never  Interpreter Needed?: No  Information entered by :: Remi Haggard LPN   Activities of Daily Living    01/31/2024   11:18 AM  In your present state of health, do you have any difficulty performing the following activities:  Hearing? 0  Vision? 0  Difficulty concentrating or making decisions? 0  Walking or climbing stairs? 0  Dressing or bathing? 0  Doing errands, shopping? 0  Preparing Food and eating ? N  Using the Toilet? N  In the past six months, have you accidently leaked urine? Y  Do you have problems with loss of bowel control? N  Managing your Medications? N  Managing your Finances? N  Housekeeping or managing your Housekeeping? N    Patient Care Team: Jeoffrey Massed, MD as PCP - General (Family Medicine) Waymon Budge, MD as Consulting Physician (Pulmonary Disease) Jerilee Field, MD as Consulting Physician (Urology) Georgena Spurling  Indicate any recent Medical Services you may have received from other than [facility] providers in the past year (date may be approximate).     Assessment:   This is a routine wellness examination for [place].  Hearing/Vision screen Hearing Screening - Comments:: No trouble hearing Vision Screening - Comments:: Up  to date [facility]   Goals Addressed             This Visit's Progress    Weight (lb) < 200 lb (90.7 kg)         Depression Screen    01/31/2024   11:20 AM 07/04/2023    2:43 PM 06/28/2023    8:48 AM 01/25/2023   11:26 AM 08/23/2019    9:00 AM 09/21/2018   10:10 AM 08/17/2018    9:58 AM  PHQ 2/9 Scores  PHQ - 2 Score 0 0 0 0 0 0 0  PHQ- 9 Score 0  1        Fall Risk    01/31/2024   11:15 AM 12/29/2023   10:37 AM 07/04/2023    2:43 PM 06/28/2023    8:48 AM 01/25/2023   11:26 AM  Fall Risk   Falls in the past year? 0 0 0 0 0  Number falls in past yr: 0   0 0  Injury with Fall? 0   0 0  Risk for fall due to :    No Fall Risks No Fall Risks  Follow up Falls evaluation completed;Education provided;Falls prevention discussed   Falls evaluation completed Falls evaluation completed    MEDICARE RISK AT HOME: Medicare Risk at Home Any stairs in or around the home?: Yes If so, are there any without handrails?: No Home free of loose throw rugs in walkways, pet beds, electrical cords, etc?: Yes Adequate lighting in your home to reduce risk of falls?: Yes Life alert?: No Use of a cane, walker or w/c?: No Grab bars in the bathroom?: Yes Shower chair or bench in shower?: No Elevated toilet seat or a handicapped toilet?: Yes  TIMED UP AND GO:  Was the test performed?  No  Cognitive Function:    09/21/2018   10:10 AM  MMSE - Mini Mental State Exam  Orientation to time 5  Orientation to Place 5  Registration 3  Attention/ Calculation 5  Recall 3  Language- name 2 objects 2  Language- repeat 1  Language- follow 3 step command 3  Language- read & follow direction 1  Write a sentence 1  Copy design 1  Total score 30        01/31/2024   11:16 AM 01/25/2023   11:27 AM  6CIT Screen  What Year? 0 points 0 points  What month? 0 points 0 points  What time? 0 points 0 points  Count back from 20 0 points 0 points  Months in reverse 0 points 0 points  Repeat phrase 0 points 0  points  Total Score 0 points 0 points    Immunizations Immunization History  Administered Date(s) Administered   Fluad Quad(high Dose 65+) 07/19/2019, 08/23/2019   Influenza, High Dose Seasonal PF 08/16/2017, 08/17/2018   Pneumococcal Conjugate-13 02/16/2015   Pneumococcal Polysaccharide-23 01/21/2013   Td 01/18/2017   Zoster, Live 01/21/2013    TDAP status: Up to date  Flu Vaccine status: Declined, Education has been provided regarding the importance of this vaccine but patient still declined. Advised may receive this vaccine at local pharmacy or Health Dept. Aware to provide a copy of the vaccination record if obtained from local pharmacy or Health Dept. Verbalized acceptance and understanding.  Pneumococcal vaccine status: Up to date  Covid-19 vaccine status: Information provided on how to obtain vaccines.   Qualifies for Shingles Vaccine? Yes   Zostavax completed No   Shingrix Completed?: No.    Education has been provided regarding the importance of this vaccine. Patient has been advised to call insurance company to determine out of pocket expense if they have not yet received this vaccine. Advised may also receive vaccine at local pharmacy or Health Dept. Verbalized acceptance and understanding.  Screening Tests Health Maintenance  Topic Date Due   Diabetic kidney evaluation - Urine ACR  02/15/2019   INFLUENZA VACCINE  02/26/2024 (Originally 06/29/2023)   Zoster Vaccines- Shingrix (1 of 2) 03/30/2024 (Originally 08/21/1996)   Colonoscopy  05/28/2024 (Originally 09/07/2023)   Diabetic kidney evaluation - eGFR measurement  06/27/2024   HEMOGLOBIN A1C  06/30/2024   OPHTHALMOLOGY EXAM  07/31/2024   FOOT EXAM  12/31/2024   Medicare Annual Wellness (AWV)  01/30/2025   DTaP/Tdap/Td (2 - Tdap) 01/18/2027   Pneumonia Vaccine 52+ Years old  Completed   Hepatitis C Screening  Completed   HPV VACCINES  Aged Out   COVID-19 Vaccine  Discontinued    Health Maintenance  Health  Maintenance Due  Topic Date Due   Diabetic kidney evaluation - Urine ACR  02/15/2019    Colorectal cancer screening: No longer required.   Lung Cancer Screening: (Low Dose CT Chest recommended if Age 73-80 years, 20 pack-year currently smoking OR have quit w/in 15years.) does not qualify.   Lung Cancer Screening Referral:   Additional Screening:  Hepatitis C Screening: does not qualify; Completed 2018  Vision Screening: Recommended annual ophthalmology exams for early detection of glaucoma and other disorders of the eye. Is the patient up to date with their annual eye exam?  Yes  Who is the provider or what is the name of the office in which the patient attends annual eye exams? [facility] If pt is not established with a provider, would they like to be referred to  a provider to establish care? No .   Dental Screening: Recommended annual dental exams for proper oral hygiene  Nutrition Risk Assessment:  Has the patient had any N/V/D within the last 2 months?  No  Does the patient have any non-healing wounds?  No  Has the patient had any unintentional weight loss or weight gain?  No   Diabetes:  Is the patient diabetic?  Yes  If diabetic, was a CBG obtained today?  No  Did the patient bring in their glucometer from home?  No  How often do you monitor your CBG's? Does not check.   Financial Strains and Diabetes Management:  Are you having any financial strains with the device, your supplies or your medication? No .  Does the patient want to be seen by Chronic Care Management for management of their diabetes?  No  Would the patient like to be referred to a Nutritionist or for Diabetic Management?  No   Diabetic Exams:  Diabetic Eye Exam: . Pt has been advised about the importance in completing this exam. Advised pt to expect a call from office referred to regarding appt.  Diabetic Foot Exam: . Pt has been advised about the importance in completing this exam..    Community  Resource Referral / Chronic Care Management: CRR required this visit?  No   CCM required this visit?  No     Plan:     I have personally reviewed and noted the following in the patient's chart:   Medical and social history Use of alcohol, tobacco or illicit drugs  Current medications and supplements including opioid prescriptions. Patient is not currently taking opioid prescriptions. Functional ability and status Nutritional status Physical activity Advanced directives List of other physicians Hospitalizations, surgeries, and ER visits in previous 12 months Vitals Screenings to include cognitive, depression, and falls Referrals and appointments  In addition, I have reviewed and discussed with patient certain preventive protocols, quality metrics, and best practice recommendations. A written personalized care plan for preventive services as well as general preventive health recommendations were provided to patient.     Remi Haggard, LPN   12/01/863   After Visit Summary: (MyChart) Due to this being a telephonic visit, the after visit summary with patients personalized plan was offered to patient via MyChart   Nurse Notes:

## 2024-02-02 ENCOUNTER — Ambulatory Visit (HOSPITAL_BASED_OUTPATIENT_CLINIC_OR_DEPARTMENT_OTHER)
Admission: RE | Admit: 2024-02-02 | Discharge: 2024-02-02 | Disposition: A | Discharge status: Home / Self Care | Payer: PPO | Source: Ambulatory Visit | Attending: Family Medicine | Admitting: Family Medicine

## 2024-02-02 DIAGNOSIS — R918 Other nonspecific abnormal finding of lung field: Secondary | ICD-10-CM | POA: Insufficient documentation

## 2024-02-02 DIAGNOSIS — K449 Diaphragmatic hernia without obstruction or gangrene: Secondary | ICD-10-CM | POA: Diagnosis not present

## 2024-02-15 ENCOUNTER — Encounter: Payer: Self-pay | Admitting: Family Medicine

## 2024-02-28 ENCOUNTER — Other Ambulatory Visit: Payer: Self-pay | Admitting: Family Medicine

## 2024-02-28 DIAGNOSIS — R918 Other nonspecific abnormal finding of lung field: Secondary | ICD-10-CM

## 2024-02-28 DIAGNOSIS — R972 Elevated prostate specific antigen [PSA]: Secondary | ICD-10-CM

## 2024-02-28 DIAGNOSIS — E119 Type 2 diabetes mellitus without complications: Secondary | ICD-10-CM

## 2024-02-29 ENCOUNTER — Encounter: Payer: Self-pay | Admitting: Family Medicine

## 2024-03-05 DIAGNOSIS — G4733 Obstructive sleep apnea (adult) (pediatric): Secondary | ICD-10-CM | POA: Diagnosis not present

## 2024-03-09 DIAGNOSIS — I480 Paroxysmal atrial fibrillation: Secondary | ICD-10-CM | POA: Diagnosis not present

## 2024-03-09 DIAGNOSIS — I358 Other nonrheumatic aortic valve disorders: Secondary | ICD-10-CM | POA: Diagnosis not present

## 2024-03-09 DIAGNOSIS — I462 Cardiac arrest due to underlying cardiac condition: Secondary | ICD-10-CM | POA: Diagnosis not present

## 2024-03-09 DIAGNOSIS — I340 Nonrheumatic mitral (valve) insufficiency: Secondary | ICD-10-CM | POA: Diagnosis not present

## 2024-03-09 DIAGNOSIS — Z86718 Personal history of other venous thrombosis and embolism: Secondary | ICD-10-CM | POA: Diagnosis not present

## 2024-03-09 DIAGNOSIS — Z7982 Long term (current) use of aspirin: Secondary | ICD-10-CM | POA: Diagnosis not present

## 2024-03-09 DIAGNOSIS — I3139 Other pericardial effusion (noninflammatory): Secondary | ICD-10-CM | POA: Diagnosis not present

## 2024-03-09 DIAGNOSIS — R55 Syncope and collapse: Secondary | ICD-10-CM | POA: Diagnosis not present

## 2024-03-09 DIAGNOSIS — Z9889 Other specified postprocedural states: Secondary | ICD-10-CM | POA: Diagnosis not present

## 2024-03-09 DIAGNOSIS — I6523 Occlusion and stenosis of bilateral carotid arteries: Secondary | ICD-10-CM | POA: Diagnosis not present

## 2024-03-09 DIAGNOSIS — I484 Atypical atrial flutter: Secondary | ICD-10-CM | POA: Diagnosis not present

## 2024-03-09 DIAGNOSIS — I517 Cardiomegaly: Secondary | ICD-10-CM | POA: Diagnosis not present

## 2024-03-09 DIAGNOSIS — I7389 Other specified peripheral vascular diseases: Secondary | ICD-10-CM | POA: Diagnosis not present

## 2024-03-09 DIAGNOSIS — N3289 Other specified disorders of bladder: Secondary | ICD-10-CM | POA: Diagnosis not present

## 2024-03-09 DIAGNOSIS — J90 Pleural effusion, not elsewhere classified: Secondary | ICD-10-CM | POA: Diagnosis not present

## 2024-03-09 DIAGNOSIS — I25112 Atherosclerotic heart disease of native coronary artery with refractory angina pectoris: Secondary | ICD-10-CM | POA: Diagnosis not present

## 2024-03-09 DIAGNOSIS — Z7902 Long term (current) use of antithrombotics/antiplatelets: Secondary | ICD-10-CM | POA: Diagnosis not present

## 2024-03-09 DIAGNOSIS — J189 Pneumonia, unspecified organism: Secondary | ICD-10-CM | POA: Diagnosis not present

## 2024-03-09 DIAGNOSIS — I82612 Acute embolism and thrombosis of superficial veins of left upper extremity: Secondary | ICD-10-CM | POA: Diagnosis not present

## 2024-03-09 DIAGNOSIS — E119 Type 2 diabetes mellitus without complications: Secondary | ICD-10-CM | POA: Diagnosis not present

## 2024-03-09 DIAGNOSIS — I4891 Unspecified atrial fibrillation: Secondary | ICD-10-CM | POA: Diagnosis not present

## 2024-03-09 DIAGNOSIS — I4892 Unspecified atrial flutter: Secondary | ICD-10-CM | POA: Diagnosis not present

## 2024-03-09 DIAGNOSIS — I2119 ST elevation (STEMI) myocardial infarction involving other coronary artery of inferior wall: Secondary | ICD-10-CM | POA: Diagnosis not present

## 2024-03-09 DIAGNOSIS — I493 Ventricular premature depolarization: Secondary | ICD-10-CM | POA: Diagnosis not present

## 2024-03-09 DIAGNOSIS — I2109 ST elevation (STEMI) myocardial infarction involving other coronary artery of anterior wall: Secondary | ICD-10-CM | POA: Diagnosis not present

## 2024-03-09 DIAGNOSIS — I469 Cardiac arrest, cause unspecified: Secondary | ICD-10-CM | POA: Diagnosis not present

## 2024-03-09 DIAGNOSIS — I2510 Atherosclerotic heart disease of native coronary artery without angina pectoris: Secondary | ICD-10-CM | POA: Diagnosis not present

## 2024-03-09 DIAGNOSIS — I472 Ventricular tachycardia, unspecified: Secondary | ICD-10-CM | POA: Diagnosis not present

## 2024-03-09 DIAGNOSIS — Z7409 Other reduced mobility: Secondary | ICD-10-CM | POA: Diagnosis not present

## 2024-03-09 DIAGNOSIS — I10 Essential (primary) hypertension: Secondary | ICD-10-CM | POA: Diagnosis not present

## 2024-03-09 DIAGNOSIS — R404 Transient alteration of awareness: Secondary | ICD-10-CM | POA: Diagnosis not present

## 2024-03-09 DIAGNOSIS — J810 Acute pulmonary edema: Secondary | ICD-10-CM | POA: Diagnosis not present

## 2024-03-09 DIAGNOSIS — I509 Heart failure, unspecified: Secondary | ICD-10-CM | POA: Diagnosis not present

## 2024-03-09 DIAGNOSIS — R000 Tachycardia, unspecified: Secondary | ICD-10-CM | POA: Diagnosis not present

## 2024-03-09 DIAGNOSIS — I2129 ST elevation (STEMI) myocardial infarction involving other sites: Secondary | ICD-10-CM | POA: Diagnosis not present

## 2024-03-09 DIAGNOSIS — I491 Atrial premature depolarization: Secondary | ICD-10-CM | POA: Diagnosis not present

## 2024-03-09 DIAGNOSIS — D6959 Other secondary thrombocytopenia: Secondary | ICD-10-CM | POA: Diagnosis not present

## 2024-03-09 DIAGNOSIS — Z951 Presence of aortocoronary bypass graft: Secondary | ICD-10-CM | POA: Diagnosis not present

## 2024-03-09 DIAGNOSIS — I213 ST elevation (STEMI) myocardial infarction of unspecified site: Secondary | ICD-10-CM | POA: Diagnosis not present

## 2024-03-09 DIAGNOSIS — I499 Cardiac arrhythmia, unspecified: Secondary | ICD-10-CM | POA: Diagnosis not present

## 2024-03-09 DIAGNOSIS — I4901 Ventricular fibrillation: Secondary | ICD-10-CM | POA: Diagnosis not present

## 2024-03-09 DIAGNOSIS — J9601 Acute respiratory failure with hypoxia: Secondary | ICD-10-CM | POA: Diagnosis not present

## 2024-03-09 DIAGNOSIS — Z794 Long term (current) use of insulin: Secondary | ICD-10-CM | POA: Diagnosis not present

## 2024-03-09 DIAGNOSIS — D62 Acute posthemorrhagic anemia: Secondary | ICD-10-CM | POA: Diagnosis not present

## 2024-03-09 DIAGNOSIS — I25110 Atherosclerotic heart disease of native coronary artery with unstable angina pectoris: Secondary | ICD-10-CM | POA: Diagnosis not present

## 2024-03-09 DIAGNOSIS — Z4659 Encounter for fitting and adjustment of other gastrointestinal appliance and device: Secondary | ICD-10-CM | POA: Diagnosis not present

## 2024-03-09 DIAGNOSIS — E785 Hyperlipidemia, unspecified: Secondary | ICD-10-CM | POA: Diagnosis not present

## 2024-03-09 DIAGNOSIS — R42 Dizziness and giddiness: Secondary | ICD-10-CM | POA: Diagnosis not present

## 2024-03-09 DIAGNOSIS — R918 Other nonspecific abnormal finding of lung field: Secondary | ICD-10-CM | POA: Diagnosis not present

## 2024-03-09 DIAGNOSIS — Z79899 Other long term (current) drug therapy: Secondary | ICD-10-CM | POA: Diagnosis not present

## 2024-03-09 DIAGNOSIS — E872 Acidosis, unspecified: Secondary | ICD-10-CM | POA: Diagnosis not present

## 2024-03-09 DIAGNOSIS — Z789 Other specified health status: Secondary | ICD-10-CM | POA: Diagnosis not present

## 2024-03-09 DIAGNOSIS — J9811 Atelectasis: Secondary | ICD-10-CM | POA: Diagnosis not present

## 2024-03-09 DIAGNOSIS — R0689 Other abnormalities of breathing: Secondary | ICD-10-CM | POA: Diagnosis not present

## 2024-03-14 ENCOUNTER — Telehealth: Payer: Self-pay

## 2024-03-14 NOTE — Telephone Encounter (Signed)
 Copied from CRM [id number]. Topic: Clinical - Medical Advice >> Mar 14, 2024  9:39 AM Marlan Silva wrote: Reason for CRM: Patients dad suffered a heart attack on Saturday, pt is still in cardio ICU and is recovering well. Is requesting a call back with some medical advice Koki Buxton [facility]) [phone].  Pt's son was calling to see what the next steps would be for his dad. Advised pt should be scheduled for a follow up within 7-10 days of being discharged. He had no further questions at this time.

## 2024-03-20 DIAGNOSIS — N401 Enlarged prostate with lower urinary tract symptoms: Secondary | ICD-10-CM | POA: Diagnosis not present

## 2024-03-20 DIAGNOSIS — I252 Old myocardial infarction: Secondary | ICD-10-CM | POA: Diagnosis not present

## 2024-03-20 DIAGNOSIS — Z789 Other specified health status: Secondary | ICD-10-CM | POA: Diagnosis not present

## 2024-03-20 DIAGNOSIS — Z8674 Personal history of sudden cardiac arrest: Secondary | ICD-10-CM | POA: Diagnosis not present

## 2024-03-20 DIAGNOSIS — R350 Frequency of micturition: Secondary | ICD-10-CM | POA: Diagnosis not present

## 2024-03-20 DIAGNOSIS — J811 Chronic pulmonary edema: Secondary | ICD-10-CM | POA: Diagnosis not present

## 2024-03-20 DIAGNOSIS — I10 Essential (primary) hypertension: Secondary | ICD-10-CM | POA: Diagnosis not present

## 2024-03-20 DIAGNOSIS — Z48812 Encounter for surgical aftercare following surgery on the circulatory system: Secondary | ICD-10-CM | POA: Diagnosis not present

## 2024-03-20 DIAGNOSIS — E785 Hyperlipidemia, unspecified: Secondary | ICD-10-CM | POA: Diagnosis not present

## 2024-03-20 DIAGNOSIS — Z6830 Body mass index (BMI) 30.0-30.9, adult: Secondary | ICD-10-CM | POA: Diagnosis not present

## 2024-03-20 DIAGNOSIS — G4733 Obstructive sleep apnea (adult) (pediatric): Secondary | ICD-10-CM | POA: Diagnosis not present

## 2024-03-20 DIAGNOSIS — Z7409 Other reduced mobility: Secondary | ICD-10-CM | POA: Diagnosis not present

## 2024-03-20 DIAGNOSIS — R2689 Other abnormalities of gait and mobility: Secondary | ICD-10-CM | POA: Diagnosis not present

## 2024-03-20 DIAGNOSIS — Z86711 Personal history of pulmonary embolism: Secondary | ICD-10-CM | POA: Diagnosis not present

## 2024-03-20 DIAGNOSIS — Z951 Presence of aortocoronary bypass graft: Secondary | ICD-10-CM | POA: Diagnosis not present

## 2024-03-20 DIAGNOSIS — E669 Obesity, unspecified: Secondary | ICD-10-CM | POA: Diagnosis not present

## 2024-03-20 DIAGNOSIS — J90 Pleural effusion, not elsewhere classified: Secondary | ICD-10-CM | POA: Diagnosis not present

## 2024-03-20 DIAGNOSIS — Z794 Long term (current) use of insulin: Secondary | ICD-10-CM | POA: Diagnosis not present

## 2024-03-20 DIAGNOSIS — Z86718 Personal history of other venous thrombosis and embolism: Secondary | ICD-10-CM | POA: Diagnosis not present

## 2024-03-20 DIAGNOSIS — I480 Paroxysmal atrial fibrillation: Secondary | ICD-10-CM | POA: Diagnosis not present

## 2024-03-20 DIAGNOSIS — I4892 Unspecified atrial flutter: Secondary | ICD-10-CM | POA: Diagnosis not present

## 2024-03-20 DIAGNOSIS — D62 Acute posthemorrhagic anemia: Secondary | ICD-10-CM | POA: Diagnosis not present

## 2024-03-20 DIAGNOSIS — S2243XD Multiple fractures of ribs, bilateral, subsequent encounter for fracture with routine healing: Secondary | ICD-10-CM | POA: Diagnosis not present

## 2024-03-20 DIAGNOSIS — E119 Type 2 diabetes mellitus without complications: Secondary | ICD-10-CM | POA: Diagnosis not present

## 2024-03-20 DIAGNOSIS — I2510 Atherosclerotic heart disease of native coronary artery without angina pectoris: Secondary | ICD-10-CM | POA: Diagnosis not present

## 2024-03-27 ENCOUNTER — Telehealth: Payer: Self-pay

## 2024-03-27 DIAGNOSIS — I213 ST elevation (STEMI) myocardial infarction of unspecified site: Secondary | ICD-10-CM

## 2024-03-27 DIAGNOSIS — I2129 ST elevation (STEMI) myocardial infarction involving other sites: Secondary | ICD-10-CM

## 2024-03-27 NOTE — Telephone Encounter (Signed)
 Copied from CRM [id number]. Topic: Clinical - Medical Advice >> Mar 27, 2024  8:36 AM Wayne Lamb wrote: Reason for CRM: patient is still in the hospital currently, however his clinical team there advised that he contact his doctor and speak with him regarding cardiac rehab. Patient was wondering if doctor/clinical team could give him a call to talk this over. Thank you.   Please advise if anything can be done currently, pt does not currently have cardiologist.

## 2024-03-28 ENCOUNTER — Encounter: Payer: Self-pay | Admitting: Family Medicine

## 2024-03-28 DIAGNOSIS — Z7409 Other reduced mobility: Secondary | ICD-10-CM | POA: Diagnosis not present

## 2024-03-28 NOTE — Addendum Note (Signed)
 Addended by: Terris Fickle D on: 03/28/2024 11:31 AM   Modules accepted: Orders

## 2024-03-28 NOTE — Telephone Encounter (Signed)
 Please order referral to cardiac rehab with [facility], diagnosis ST elevation MI, CABG about 2 weeks ago. He is currently in inpatient rehab ([facility] in [place]). When you contact patient with this information asked him if he needs referral to a cardiologist in the [facility] system or not.  If he does then we can make this referral now to have the process in motion. -thx

## 2024-03-28 NOTE — Telephone Encounter (Signed)
 Spoke with patient, he was set up for cardiac rehab and cardiology thru [facility] but he does not want to drive to [facility] and prefers a closer location to home. He was advised of provider recommendations for [facility], if he was agreeable. Both referrals have been placed. He will follow up with PCP on 5/7

## 2024-04-03 ENCOUNTER — Ambulatory Visit: Admitting: Family Medicine

## 2024-04-03 ENCOUNTER — Encounter: Payer: Self-pay | Admitting: Family Medicine

## 2024-04-03 ENCOUNTER — Other Ambulatory Visit: Payer: Self-pay | Admitting: Family Medicine

## 2024-04-03 VITALS — BP 94/60 | HR 66 | Temp 97.8°F | Ht 68.0 in | Wt 198.6 lb

## 2024-04-03 DIAGNOSIS — I2510 Atherosclerotic heart disease of native coronary artery without angina pectoris: Secondary | ICD-10-CM

## 2024-04-03 DIAGNOSIS — D649 Anemia, unspecified: Secondary | ICD-10-CM | POA: Diagnosis not present

## 2024-04-03 DIAGNOSIS — Z79899 Other long term (current) drug therapy: Secondary | ICD-10-CM

## 2024-04-03 DIAGNOSIS — Z951 Presence of aortocoronary bypass graft: Secondary | ICD-10-CM

## 2024-04-03 DIAGNOSIS — I480 Paroxysmal atrial fibrillation: Secondary | ICD-10-CM

## 2024-04-03 DIAGNOSIS — R7989 Other specified abnormal findings of blood chemistry: Secondary | ICD-10-CM

## 2024-04-03 DIAGNOSIS — I252 Old myocardial infarction: Secondary | ICD-10-CM | POA: Diagnosis not present

## 2024-04-03 LAB — CBC
HCT: 39.1 % (ref 39.0–52.0)
Hemoglobin: 12.7 g/dL — ABNORMAL LOW (ref 13.0–17.0)
MCHC: 32.6 g/dL (ref 30.0–36.0)
MCV: 90.3 fl (ref 78.0–100.0)
Platelets: 377 10*3/uL (ref 150.0–400.0)
RBC: 4.33 Mil/uL (ref 4.22–5.81)
RDW: 15.3 % (ref 11.5–15.5)
WBC: 5.8 10*3/uL (ref 4.0–10.5)

## 2024-04-03 LAB — COMPREHENSIVE METABOLIC PANEL WITH GFR
ALT: 17 U/L (ref 0–53)
AST: 20 U/L (ref 0–37)
Albumin: 4.3 g/dL (ref 3.5–5.2)
Alkaline Phosphatase: 200 U/L — ABNORMAL HIGH (ref 39–117)
BUN: 17 mg/dL (ref 6–23)
CO2: 27 meq/L (ref 19–32)
Calcium: 9.4 mg/dL (ref 8.4–10.5)
Chloride: 99 meq/L (ref 96–112)
Creatinine, Ser: 1.34 mg/dL (ref 0.40–1.50)
GFR: 51.06 mL/min — ABNORMAL LOW (ref >60.00)
Glucose, Bld: 138 mg/dL — ABNORMAL HIGH (ref 70–99)
Potassium: 4.5 meq/L (ref 3.5–5.1)
Sodium: 137 meq/L (ref 135–145)
Total Bilirubin: 0.8 mg/dL (ref 0.2–1.2)
Total Protein: 7.2 g/dL (ref 6.0–8.3)

## 2024-04-03 LAB — TSH: TSH: 5.45 u[IU]/mL (ref 0.35–5.50)

## 2024-04-03 MED ORDER — EZETIMIBE 10 MG PO TABS: 10.0000 mg | ORAL_TABLET | Freq: Every day | ORAL | 3 refills | Status: AC

## 2024-04-03 MED ORDER — METFORMIN HCL ER (OSM) 500 MG PO TB24
500.0000 mg | ORAL_TABLET | Freq: Every day | ORAL | 3 refills | Status: DC
Start: 2024-04-03 — End: 2024-04-03

## 2024-04-03 MED ORDER — AMIODARONE HCL 200 MG PO TABS: 200.0000 mg | ORAL_TABLET | Freq: Every day | ORAL | 1 refills | Status: DC

## 2024-04-03 MED ORDER — CLOPIDOGREL BISULFATE 75 MG PO TABS: 75.0000 mg | ORAL_TABLET | Freq: Every day | ORAL | 1 refills | Status: DC

## 2024-04-03 MED ORDER — ATORVASTATIN CALCIUM 80 MG PO TABS: 80.0000 mg | ORAL_TABLET | Freq: Every day | ORAL | 3 refills | Status: AC

## 2024-04-03 MED ORDER — LOSARTAN POTASSIUM 25 MG PO TABS: 12.5000 mg | ORAL_TABLET | Freq: Every day | ORAL | 3 refills | Status: DC

## 2024-04-03 MED ORDER — OMEPRAZOLE 20 MG PO CPDR: 20.0000 mg | DELAYED_RELEASE_CAPSULE | Freq: Every morning | ORAL | 3 refills | Status: AC

## 2024-04-03 MED ORDER — METOPROLOL TARTRATE 25 MG PO TABS: 12.5000 mg | ORAL_TABLET | Freq: Two times a day (BID) | ORAL | 3 refills | Status: DC

## 2024-04-03 NOTE — Telephone Encounter (Signed)
 Rx sent.

## 2024-04-03 NOTE — Progress Notes (Signed)
 04/05/2024  CC:  Chief Complaint  Patient presents with   [name]    Patient is a 78 y.o. male who presents accompanied by his son for hospital follow up. Dates hospitalized: March 09, 2024 to March 20, 2024. Days since d/c from hospital: 14 days Patient was discharged from hospital to rehab. Reason for admission to hospital: Cardiac arrest, ST elevation MI, 4 vessel CABG.   I have reviewed patient's discharge summary plus pertinent specific notes, labs, and imaging from the hospitalization.  He had syncope while playing pickle ball.  Chest compressions were immediately begun, was in V-fib and was shocked on the scene.  Shocked again in the ambulance and he then became conscious.  He did not have any preceding chest pain, shortness of breath, or any other symptoms to suggest cardiopulmonary problems. Heart cath showed multivessel CAD.  He did sustain STEMI.  He underwent CABG on 03/10/2024.  Postop complicated by atrial fibrillation and he was started on amiodarone .  Converted to sinus by the time he was discharged.  No anticoagulant due to being on aspirin and Plavix . He had some postop anemia, mild TSH elevation.  Renal function remained normal. He has preserved LV function.  Currently: He is doing very well, coming along nicely with energy level.  He is eating well and drinking well. Sleep is broken.  He has had questionable results with Remeron prescribed by the hospital.  He is taking a 15 mg dose.  He has tried melatonin.  Not clearly helpful. He has no lower extremity swelling. He has chronic overactive bladder symptoms.  His diuretic has been making this more of a problem, particularly at night.  Medication reconciliation was done today and patient is taking meds as recommended by discharging hospitalist/specialist.   Discharge medications: Aspirin 81 mg a day, Plavix  75 mg a day, atorvastatin  80 mg a day, Lopressor  12.5 mg twice daily, torsemide 10 mg daily,  losartan  37.5 mg daily, Zetia  10 mg daily, amiodarone  200 mg a day,  ROS as above, plus--> no fevers, no CP, no SOB, no wheezing, no cough, no dizziness, no HAs, no rashes, no melena/hematochezia.  No polyuria or polydipsia.  No myalgias or arthralgias.  No focal weakness, paresthesias, or tremors.  No acute vision or hearing abnormalities.  No dysuria or unusual/new urinary urgency or frequency.  No leg pains or swelling. No n/v/d or abd pain.  No palpitations.    PMH:  Past Medical History:  Diagnosis Date   BPH with obstruction/lower urinary tract symptoms    Cataract    Diabetes mellitus without complication (HCC)    Dx 05/2023-->fasting gluc 200, Hba1c 7.7%   Diverticulosis    2014 colonoscopy   Elevated PSA    MRI prostate was done 2016 to eval persistently rising PSAs (no biopsy had been done): this showed no sign of prostate ca.  Stable 07/2017, 01/2018, 01/2019, 12/2019, 05/2020.   Encounter for hepatitis C virus screening test for high risk patient    Done by [facility] physicians 01/18/17   Hypercholesterolemia    Leg DVT (deep venous thromboembolism), acute, left (HCC)    11/2022   Lumbar spondylosis    MRI L spine 2005: severe facet deg, SI deg   Obesity, Class II, BMI 35-39.9    OSA on CPAP    compliant   Prostate nodule 2016   prostate nodule or ridge in R prostate lobe--w/u reassuring 2016    Pulmonary nodule    11/2022.  F/u imaging stable,  no further imaging needed.   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   STEMI (ST elevation myocardial infarction) (HCC)    Vfib arretst 02/2024-->CABG   Tubular adenoma of colon 2014   recommend repeat colonoscopy 2019    PSH:  Past Surgical History:  Procedure Laterality Date   COLONOSCOPY     02/14/13 adenoma x 1 ([facility]).  +diverticulosis and int hem   CORONARY ARTERY BYPASS GRAFT     03/10/24 (4 vessel)   FRACTURE SURGERY     INGUINAL HERNIA REPAIR Left    right clavicle fracture     pin inserted (Dr. Donavon Fudge)    TONSILLECTOMY     TRANSTHORACIC ECHOCARDIOGRAM     12/22/22 normal except grd I DD    MEDS:  Outpatient Medications Prior to Visit  Medication Sig Dispense Refill   acetaminophen  (TYLENOL ) 500 MG tablet Take 2 tablets by mouth every 6 (six) hours as needed for mild pain (pain score 1-3).     ascorbic acid (VITAMIN C) 500 MG tablet Take 500 mg by mouth daily.     aspirin 81 MG chewable tablet Chew 81 mg by mouth daily.     Boswellia-Glucosamine-Vit D (OSTEO BI-FLEX ONE PER DAY PO) Take 1 tablet by mouth daily.     mirtazapine (REMERON SOL-TAB) 15 MG disintegrating tablet Take 15 mg by mouth at bedtime. 1/2 tab po qhs     zinc gluconate 50 MG tablet Take 50 mg by mouth daily.     amiodarone  (PACERONE ) 200 MG tablet Take 200 mg by mouth daily.     atorvastatin  (LIPITOR) 80 MG tablet Take 80 mg by mouth at bedtime.     clopidogrel  (PLAVIX ) 75 MG tablet Take 75 mg by mouth daily.     ezetimibe  (ZETIA ) 10 MG tablet Take 10 mg by mouth at bedtime.     losartan  (COZAAR ) 25 MG tablet Take 0.5 tablets by mouth daily.     metformin  (FORTAMET ) 500 MG (OSM) 24 hr tablet Take 500 mg by mouth daily with breakfast.     metoprolol  tartrate (LOPRESSOR ) 25 MG tablet Take 0.5 tablets by mouth 2 (two) times daily.     omeprazole  (PRILOSEC) 20 MG capsule Take 20 mg by mouth every morning.     torsemide (DEMADEX) 10 MG tablet Take 10 mg by mouth daily.     No facility-administered medications prior to visit.    Physical Exam    04/03/2024   11:14 AM 01/01/2024    9:07 AM 01/01/2024    9:00 AM  Vitals with BMI  Height 5\' 8"     Weight 198 lbs 10 oz  214 lbs 10 oz  BMI 30.2    Systolic 94 136 140  Diastolic 60 88 92  Pulse 66  75   Gen: Alert, well appearing.  Patient is oriented to person, place, time, and situation. AFFECT: pleasant, lucid thought and speech. CV: RRR, no m/r/g.   LUNGS: CTA bilat, nonlabored resps, good aeration in all lung fields. EXT: no clubbing or cyanosis.  no edema.    Pertinent labs/imaging 03/25/2024: White blood cell 6.8, hemoglobin 9.8, MCV 91.6, platelets 450. Urinalysis normal on 03/23/2024. 03/09/2024 hemoglobin A1c 8.1%, TSH 13.3, free T4 1.1  Lab Results  Component Value Date   HGBA1C 9.0 (A) 01/01/2024   HGBA1C 9.0 01/01/2024   HGBA1C 9.0 (A) 01/01/2024   HGBA1C 9.0 (A) 01/01/2024   ASSESSMENT/PLAN:  #1 coronary artery disease, out-of-hospital arrest, ST  elevation MI, CABG x 4. Preserved LV function. He is doing very well.  Asymptomatic. He is compliant with his medications. Will discontinue his torsemide today.  Continue  Aspirin 81 mg a day, Plavix  75 mg a day, atorvastatin  80 mg a day, Lopressor  12.5 mg twice daily, losartan  37.5 mg daily, Zetia  10 mg daily, amiodarone  200 mg a day. We are in the process of arranging cardiology follow-up with a cardiologist in the [facility] network. We are also in the process of arranging cardiac rehab.  2.  Postop atrial fibrillation. He is in sinus rhythm, has been continued on amiodarone  200 mg a day. No anticoagulant because he is on aspirin and Plavix  at this time. 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 Monitor TSH every 3 months while on amiodarone --> TSH today.  3.  Diabetes without complication. His hemoglobin A1c has improved from 9% to 8.1% when checked on 03/09/2024. Continue metformin  500 mg a day.  He is doing excellent dietary changes.  4.  Sleep dysfunction. Part of this is due to his worsening nocturia due to torsemide. We are discontinuing torsemide today. Additionally, will have him take one half of his 15 mg Remeron SoluTab.  Check CBC, c-Met, and TSH today.   Spent 50 min with pt today reviewing HPI, reviewing relevant past history, doing exam, reviewing and discussing lab and imaging data, and formulating plans.  FOLLOW UP:  4 wks  Signed:  Arletha Lady, MD           04/05/2024

## 2024-04-04 ENCOUNTER — Encounter: Payer: Self-pay | Admitting: Family Medicine

## 2024-04-04 DIAGNOSIS — R748 Abnormal levels of other serum enzymes: Secondary | ICD-10-CM

## 2024-04-05 ENCOUNTER — Encounter (HOSPITAL_COMMUNITY): Payer: Self-pay

## 2024-04-05 ENCOUNTER — Encounter: Payer: Self-pay | Admitting: Family Medicine

## 2024-04-08 ENCOUNTER — Encounter: Payer: Self-pay | Admitting: Family Medicine

## 2024-04-08 NOTE — Telephone Encounter (Signed)
 FYI  Please see below

## 2024-04-09 DIAGNOSIS — Z951 Presence of aortocoronary bypass graft: Secondary | ICD-10-CM | POA: Diagnosis not present

## 2024-04-10 ENCOUNTER — Other Ambulatory Visit (INDEPENDENT_AMBULATORY_CARE_PROVIDER_SITE_OTHER)

## 2024-04-10 DIAGNOSIS — R748 Abnormal levels of other serum enzymes: Secondary | ICD-10-CM | POA: Diagnosis not present

## 2024-04-10 LAB — BASIC METABOLIC PANEL WITH GFR
BUN: 15 mg/dL (ref 6–23)
CO2: 25 meq/L (ref 19–32)
Calcium: 9.3 mg/dL (ref 8.4–10.5)
Chloride: 104 meq/L (ref 96–112)
Creatinine, Ser: 1.07 mg/dL (ref 0.40–1.50)
GFR: 66.88 mL/min (ref >60.00)
Glucose, Bld: 136 mg/dL — ABNORMAL HIGH (ref 70–99)
Potassium: 4.7 meq/L (ref 3.5–5.1)
Sodium: 139 meq/L (ref 135–145)

## 2024-04-11 ENCOUNTER — Ambulatory Visit: Payer: Self-pay | Admitting: Family Medicine

## 2024-04-15 ENCOUNTER — Encounter: Payer: Self-pay | Admitting: Family Medicine

## 2024-04-15 NOTE — Telephone Encounter (Signed)
 MyChart message read by pt.

## 2024-04-15 NOTE — Telephone Encounter (Signed)
 Increase losartan  to a whole 25 mg tab daily instead of half.

## 2024-04-18 ENCOUNTER — Encounter: Payer: Self-pay | Admitting: Family Medicine

## 2024-04-18 ENCOUNTER — Other Ambulatory Visit: Payer: Self-pay

## 2024-04-18 DIAGNOSIS — I10 Essential (primary) hypertension: Secondary | ICD-10-CM

## 2024-04-18 MED ORDER — LOSARTAN POTASSIUM 25 MG PO TABS: ORAL_TABLET | ORAL | 3 refills | Status: DC

## 2024-04-18 NOTE — Telephone Encounter (Signed)
 Hi Stefano, I do not have a good explanation for your blood pressure being higher in the morning but this is fairly common. I would like you to take a half of your 25 mg losartan  with supper.  Continue taking a whole 25 mg tab in the morning. Make a lab appointment for early next week to monitor your kidney function and potassium. Gwynn Lesches

## 2024-04-24 ENCOUNTER — Other Ambulatory Visit (INDEPENDENT_AMBULATORY_CARE_PROVIDER_SITE_OTHER)

## 2024-04-24 ENCOUNTER — Ambulatory Visit: Payer: Self-pay | Admitting: Family Medicine

## 2024-04-24 DIAGNOSIS — I10 Essential (primary) hypertension: Secondary | ICD-10-CM

## 2024-04-24 LAB — BASIC METABOLIC PANEL WITH GFR
BUN: 17 mg/dL (ref 6–23)
CO2: 28 meq/L (ref 19–32)
Calcium: 9.6 mg/dL (ref 8.4–10.5)
Chloride: 102 meq/L (ref 96–112)
Creatinine, Ser: 1.08 mg/dL (ref 0.40–1.50)
GFR: 66.12 mL/min (ref >60.00)
Glucose, Bld: 164 mg/dL — ABNORMAL HIGH (ref 70–99)
Potassium: 4.3 meq/L (ref 3.5–5.1)
Sodium: 138 meq/L (ref 135–145)

## 2024-05-01 ENCOUNTER — Encounter: Payer: Self-pay | Admitting: Family Medicine

## 2024-05-01 ENCOUNTER — Ambulatory Visit: Admitting: Family Medicine

## 2024-05-01 VITALS — BP 132/70 | HR 58 | Temp 97.8°F | Ht 68.0 in | Wt 192.4 lb

## 2024-05-01 DIAGNOSIS — I10 Essential (primary) hypertension: Secondary | ICD-10-CM

## 2024-05-01 DIAGNOSIS — I4891 Unspecified atrial fibrillation: Secondary | ICD-10-CM

## 2024-05-01 DIAGNOSIS — E7800 Pure hypercholesterolemia, unspecified: Secondary | ICD-10-CM | POA: Diagnosis not present

## 2024-05-01 DIAGNOSIS — I2510 Atherosclerotic heart disease of native coronary artery without angina pectoris: Secondary | ICD-10-CM

## 2024-05-01 DIAGNOSIS — E119 Type 2 diabetes mellitus without complications: Secondary | ICD-10-CM

## 2024-05-01 DIAGNOSIS — Z7984 Long term (current) use of oral hypoglycemic drugs: Secondary | ICD-10-CM | POA: Diagnosis not present

## 2024-05-01 MED ORDER — AMIODARONE HCL 200 MG PO TABS: 200.0000 mg | ORAL_TABLET | Freq: Every day | ORAL | 1 refills | Status: DC

## 2024-05-01 MED ORDER — LOSARTAN POTASSIUM 25 MG PO TABS: ORAL_TABLET | ORAL | Status: DC

## 2024-05-01 MED ORDER — METFORMIN HCL 500 MG PO TABS: 500.0000 mg | ORAL_TABLET | Freq: Two times a day (BID) | ORAL | 3 refills | Status: DC

## 2024-05-01 NOTE — Progress Notes (Signed)
 OFFICE VISIT  05/01/2024  CC:  Chief Complaint  Patient presents with   Medical Management of Chronic Issues    Patient is a 78 y.o. male who presents for 1 month follow-up coronary artery disease, postop A-fib, diabetes, sleep dysfunction. A/P as of last visit: "#1 coronary artery disease, out-of-hospital arrest, ST elevation MI, CABG x 4. Preserved LV function. He is doing very well.  Asymptomatic. He is compliant with his medications. Will discontinue his torsemide today.  Continue  Aspirin 81 mg a day, Plavix  75 mg a day, atorvastatin  80 mg a day, Lopressor  12.5 mg twice daily, losartan  37.5 mg daily, Zetia  10 mg daily, amiodarone  200 mg a day. We are in the process of arranging cardiology follow-up with a cardiologist in the [facility] network. We are also in the process of arranging cardiac rehab.   2.  Postop atrial fibrillation. He is in sinus rhythm, has been continued on amiodarone  200 mg a day. No anticoagulant because he is on aspirin and Plavix  at this time. 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 Monitor TSH every 3 months while on amiodarone --> TSH today.   3.  Diabetes without complication. His hemoglobin A1c has improved from 9% to 8.1% when checked on 03/09/2024. Continue metformin  500 mg a day.  He is doing excellent dietary changes.   4.  Sleep dysfunction. Part of this is due to his worsening nocturia due to torsemide. We are discontinuing torsemide today. Additionally, will have him take one half of his 15 mg Remeron SoluTab.   Check CBC, c-Met, and TSH today."  INTERIM HX: After last visit there were some blood pressure elevations at home and we increased his losartan  to a whole 25 mg in the mornings and a half of the 25 mg losartan  in the evenings.   He is approximately 6 weeks out from his bypass surgery and he continues to do very well. Blood pressures still elevated up into the 130s -140s for the most part, usually in the  mornings. Sometimes blood pressures down into 120s over 70s. Heart rate ranging 60-80. He has no chest pain, shortness of breath, nausea, arm pain, diaphoresis, or palpitations.  No lower extremity swelling. No headaches or dizziness.  He walks extensively every day and some days he does an hour of recumbent bicycle.  There was a problem with getting the metformin  refilled after last visit so he has not been taking this. He says his fasting glucoses are typically in the 130-135 range.   Past Medical History:  Diagnosis Date   BPH with obstruction/lower urinary tract symptoms    Cataract    Diabetes mellitus without complication (HCC)    Dx 05/2023-->fasting gluc 200, Hba1c 7.7%   Diverticulosis    2014 colonoscopy   Elevated PSA    MRI prostate was done 2016 to eval persistently rising PSAs (no biopsy had been done): this showed no sign of prostate ca.  Stable 07/2017, 01/2018, 01/2019, 12/2019, 05/2020.   Encounter for hepatitis C virus screening test for high risk patient    Done by [facility] physicians 01/18/17   Hypercholesterolemia    Leg DVT (deep venous thromboembolism), acute, left (HCC)    11/2022   Lumbar spondylosis    MRI L spine 2005: severe facet deg, SI deg   Obesity, Class II, BMI 35-39.9    OSA on CPAP    compliant   Prostate nodule 2016   prostate nodule or ridge in R prostate lobe--w/u reassuring  2016    Pulmonary nodule    11/2022.  F/u imaging stable, no further imaging needed.   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  +diverticulosis and int hem   CORONARY ARTERY BYPASS GRAFT     03/10/24 (4 vessel)   FRACTURE SURGERY     INGUINAL HERNIA REPAIR Left    right clavicle fracture     pin  inserted (Dr. Donavon Fudge)   TONSILLECTOMY     TRANSTHORACIC ECHOCARDIOGRAM     12/22/22 normal except grd I DD    Outpatient Medications Prior to Visit  Medication Sig Dispense Refill   acetaminophen  (TYLENOL ) 500 MG tablet Take 2 tablets by mouth every 6 (six) hours as needed for mild pain (pain score 1-3).     ascorbic acid (VITAMIN C) 500 MG tablet Take 500 mg by mouth daily.     aspirin 81 MG chewable tablet Chew 81 mg by mouth daily.     atorvastatin  (LIPITOR) 80 MG tablet Take 1 tablet (80 mg total) by mouth at bedtime. 90 tablet 3   Boswellia-Glucosamine-Vit D (OSTEO BI-FLEX ONE PER DAY PO) Take 1 tablet by mouth daily.     clopidogrel  (PLAVIX ) 75 MG tablet Take 1 tablet (75 mg total) by mouth daily. 30 tablet 1   ezetimibe  (ZETIA ) 10 MG tablet Take 1 tablet (10 mg total) by mouth at bedtime. 90 tablet 3   metoprolol  tartrate (LOPRESSOR ) 25 MG tablet Take 0.5 tablets (12.5 mg total) by mouth 2 (two) times daily. 90 tablet 3   mirtazapine (REMERON SOL-TAB) 15 MG disintegrating tablet Take 15 mg by mouth at bedtime. 1/2 tab po qhs     omeprazole  (PRILOSEC) 20 MG capsule Take 1 capsule (20 mg total) by mouth every morning. 90 capsule 3   zinc gluconate 50 MG tablet Take 50 mg by mouth daily.     losartan  (COZAAR ) 25 MG tablet Take a whole (25 mg) tab in the morning and a half (12.5 mg) a tablet with supper. 135 tablet 3   amiodarone  (PACERONE ) 200 MG tablet Take 1 tablet (200 mg total) by mouth daily. 30 tablet 1   metformin  (FORTAMET ) 500 MG (OSM) 24 hr tablet Take 1 tablet (500 mg total) by mouth daily with breakfast. (Patient not taking: Reported on 05/01/2024) 90 tablet 3   No facility-administered medications prior to visit.    No Known Allergies  Review of Systems As per HPI  PE:    05/01/2024   10:10 AM 04/03/2024   11:14 AM 01/01/2024    9:07 AM  Vitals with BMI  Height 5\' 8"  5\' 8"    Weight 192 lbs 6 oz 198 lbs 10 oz   BMI 29.26 30.2   Systolic 132 94 136  Diastolic 70 60 88   Pulse 58 66      Physical Exam  Gen: Alert, well appearing.  Patient is oriented to person, place, time, and situation. AFFECT: pleasant, lucid thought and speech. CV: RRR, no m/r/g.   LUNGS: CTA bilat, nonlabored resps, good aeration in all lung fields. EXT: no clubbing or cyanosis.  no edema.    LABS:  Last CBC Lab Results  Component Value  Date   WBC 5.8 04/03/2024   HGB 12.7 (L) 04/03/2024   HCT 39.1 04/03/2024   MCV 90.3 04/03/2024   MCH 30.0 12/23/2022   RDW 15.3 04/03/2024   PLT 377.0 04/03/2024   Last metabolic panel Lab Results  Component Value Date   GLUCOSE 164 (H) 04/24/2024   NA 138 04/24/2024   K 4.3 04/24/2024   CL 102 04/24/2024   CO2 28 04/24/2024   BUN 17 04/24/2024   CREATININE 1.08 04/24/2024   GFR 66.12 04/24/2024   CALCIUM  9.6 04/24/2024   PROT 7.2 04/03/2024   ALBUMIN 4.3 04/03/2024   BILITOT 0.8 04/03/2024   ALKPHOS 200 (H) 04/03/2024   AST 20 04/03/2024   ALT 17 04/03/2024   ANIONGAP 11 12/20/2022   Last lipids Lab Results  Component Value Date   CHOL 236 (H) 08/23/2019   HDL 51.10 08/23/2019   LDLCALC 152 (H) 08/23/2019   LDLDIRECT 159.0 08/16/2017   TRIG 164.0 (H) 08/23/2019   CHOLHDL 5 08/23/2019   Last hemoglobin A1c Lab Results  Component Value Date   HGBA1C 9.0 (A) 01/01/2024   HGBA1C 9.0 01/01/2024   HGBA1C 9.0 (A) 01/01/2024   HGBA1C 9.0 (A) 01/01/2024   Last thyroid  functions Lab Results  Component Value Date   TSH 5.45 04/03/2024   IMPRESSION AND PLAN:  #1 CAD, NSTEMI, CABG about 6 weeks ago. He continues to recover extremely well. He has his first follow-up with a cardiologist on June 16.  Hopefully after that time that we will get him into formal cardiac rehab. He does very well currently with home exercising. Continue aspirin, Plavix , atorvastatin , Zetia , and Lopressor .  2.  Hypertension, not ideal control. Increase losartan  to a whole 25 mg tab in the afternoon and continue the whole tab in the  morning as well. Also continue Lopressor  12.5 mg twice daily. Electrolytes and creatinine normal 1 week ago.  3.  Diabetes without complication. Home glucoses are good.   Prescription refill problems with metformin , as noted in HPI. We will go ahead and send in a new prescription for 500 mg metformin  tabs and take 1 daily. Next A1c due around 7/1  #4 postoperative atrial fibrillation. Currently sinus rhythm, on amiodarone  200 mg a day.  No anticoagulation because already on aspirin and Plavix . He has cardiology follow-up set for June 16.  An After Visit Summary was printed and given to the patient.  FOLLOW UP: Return in about 4 weeks (around 05/29/2024) for routine chronic illness f/u.  Signed:  Arletha Lady, MD           05/01/2024

## 2024-05-13 ENCOUNTER — Telehealth (HOSPITAL_COMMUNITY): Payer: Self-pay

## 2024-05-13 DIAGNOSIS — I252 Old myocardial infarction: Secondary | ICD-10-CM | POA: Diagnosis not present

## 2024-05-13 DIAGNOSIS — I25118 Atherosclerotic heart disease of native coronary artery with other forms of angina pectoris: Secondary | ICD-10-CM | POA: Diagnosis not present

## 2024-05-13 DIAGNOSIS — I480 Paroxysmal atrial fibrillation: Secondary | ICD-10-CM | POA: Diagnosis not present

## 2024-05-13 DIAGNOSIS — E119 Type 2 diabetes mellitus without complications: Secondary | ICD-10-CM | POA: Diagnosis not present

## 2024-05-13 NOTE — Telephone Encounter (Signed)
 Pt called in regards to CR, pt stated he did complete his f/u appt today at 10AM. Adv pt once RN has reviewed and clear him, hopefully we will call to schedule later this week.

## 2024-05-16 ENCOUNTER — Encounter (HOSPITAL_COMMUNITY): Payer: Self-pay

## 2024-05-16 ENCOUNTER — Telehealth (HOSPITAL_COMMUNITY): Payer: Self-pay

## 2024-05-16 ENCOUNTER — Encounter: Payer: Self-pay | Admitting: Family Medicine

## 2024-05-16 NOTE — Telephone Encounter (Signed)
 Pt insurance is active and benefits verified through [employer]. Co-pay $15, DED $0/$0 met, out of pocket $3,400/$177.50 met, co-insurance 0%. No pre-authorization required. Art Bigness spoke with Burdette Carolin from [employer] on 04/05/24 @ 2:37pm, REF# [id number].  How many CR sessions are covered? (36 visits for TCR, 72 visits for ICR)72 ICR Is this a lifetime maximum or an annual maximum? Annual Has the member used any of these services to date? No Is there a time limit (weeks/months) on start of program and/or program completion? No

## 2024-05-16 NOTE — Telephone Encounter (Signed)
 Attempted to call patient to schedule cardiac rehab- no answer, left message to call back.  Mailed letter & sent MyChart message.

## 2024-05-16 NOTE — Telephone Encounter (Signed)
 Patient was contacted and scheduled for tomorrow morning with PCP

## 2024-05-16 NOTE — Telephone Encounter (Signed)
 Pls have Wayne Lamb arrange office visit with me.

## 2024-05-17 ENCOUNTER — Ambulatory Visit: Admitting: Family Medicine

## 2024-05-17 ENCOUNTER — Telehealth (HOSPITAL_COMMUNITY): Payer: Self-pay

## 2024-05-17 ENCOUNTER — Ambulatory Visit: Attending: Family Medicine

## 2024-05-17 ENCOUNTER — Encounter: Payer: Self-pay | Admitting: Family Medicine

## 2024-05-17 VITALS — BP 107/62 | HR 59 | Temp 97.7°F | Ht 68.0 in | Wt 191.2 lb

## 2024-05-17 DIAGNOSIS — R310 Gross hematuria: Secondary | ICD-10-CM | POA: Diagnosis not present

## 2024-05-17 DIAGNOSIS — Z8679 Personal history of other diseases of the circulatory system: Secondary | ICD-10-CM

## 2024-05-17 DIAGNOSIS — R972 Elevated prostate specific antigen [PSA]: Secondary | ICD-10-CM | POA: Diagnosis not present

## 2024-05-17 DIAGNOSIS — R319 Hematuria, unspecified: Secondary | ICD-10-CM

## 2024-05-17 DIAGNOSIS — Z125 Encounter for screening for malignant neoplasm of prostate: Secondary | ICD-10-CM | POA: Diagnosis not present

## 2024-05-17 DIAGNOSIS — G459 Transient cerebral ischemic attack, unspecified: Secondary | ICD-10-CM

## 2024-05-17 LAB — POCT URINALYSIS DIPSTICK
Bilirubin, UA: NEGATIVE
Blood, UA: NEGATIVE
Glucose, UA: NEGATIVE
Ketones, UA: NEGATIVE
Leukocytes, UA: NEGATIVE
Nitrite, UA: NEGATIVE
Protein, UA: POSITIVE — AB
Spec Grav, UA: 1.015 (ref 1.010–1.025)
Urobilinogen, UA: 0.2 U/dL
pH, UA: 6 (ref 5.0–8.0)

## 2024-05-17 LAB — PSA, MEDICARE: PSA: 5.21 ng/mL — ABNORMAL HIGH (ref 0.10–4.00)

## 2024-05-17 NOTE — Progress Notes (Unsigned)
 EP To read

## 2024-05-17 NOTE — Telephone Encounter (Signed)
 Called patient to see if he was interested in participating in the Cardiac Rehab Program. Patient will come in for orientation on 6/26 and will attend the 10:15 exercise class.  Sent MyChart message.

## 2024-05-17 NOTE — Progress Notes (Signed)
 OFFICE VISIT  05/17/2024  CC:  Chief Complaint  Patient presents with   Urinary Concern    Pt has blood in urine, 2 incidents. Denies chills, night sweats, fever, side or back pain.   Patient is a 78 y.o. male who presents for urinary concerns.  HPI: 2 days ago Wayne Lamb had 2 episodes of blood in the urine. No associated voiding discomfort or new urgency or frequency. He takes aspirin and Plavix .  He has history of elevated PSA.  This was followed with surveillance for quite a while and prostate MRI during this time showed no abnormality other than with very large prostate. Renal ultrasound done 07/03/2023 to follow-up renal cyst showed diffuse bladder wall thickening likely due to underdistention.  Chronic cystitis and outlet obstruction can have similar appearance.  Also noted was prostate is markedly enlarged measuring 127 cc.  Of note, he saw Dr. Bearl Limes in cardiology with [facility] 4 days ago.  No changes were made.  He is getting set up for cardiac rehab.  He continues to feel well, has great energy and appetite. During that evaluation Eligio told him about an episode in March before he had his heart issue this year. He felt acute right arm disassociation/dysfunction combined with inability to express his thoughts with the right words.  This lasted about 3 minutes and never recurred.  He did not tell anyone about it until his visit with Dr. Bearl Limes 4 days ago. 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    Past Medical History:  Diagnosis Date   BPH with obstruction/lower urinary tract symptoms    Cataract    Diabetes mellitus without complication (HCC)    Dx 05/2023-->fasting gluc 200, Hba1c 7.7%   Diverticulosis    2014 colonoscopy   Elevated PSA    MRI prostate was done 2016 to eval persistently rising PSAs (no biopsy had been done): this showed no sign of prostate ca.  Stable 07/2017,  01/2018, 01/2019, 12/2019, 05/2020.   Encounter for hepatitis C virus screening test for high risk patient    Done by [facility] physicians 01/18/17   Hypercholesterolemia    Leg DVT (deep venous thromboembolism), acute, left (HCC)    11/2022   Lumbar spondylosis    MRI L spine 2005: severe facet deg, SI deg   Obesity, Class II, BMI 35-39.9    OSA on CPAP    compliant   Prostate nodule 2016   prostate nodule or ridge in R prostate lobe--w/u reassuring 2016    Pulmonary nodule    11/2022.  F/u imaging stable, no further imaging needed.   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  +diverticulosis and int hem   CORONARY ARTERY BYPASS GRAFT     03/10/24 (4 vessel)   FRACTURE SURGERY     INGUINAL HERNIA REPAIR Left    right clavicle fracture     pin inserted (Dr. Donavon Fudge)   TONSILLECTOMY     TRANSTHORACIC ECHOCARDIOGRAM     12/22/22 normal except grd I DD    Outpatient Medications Prior to Visit  Medication Sig Dispense Refill  acetaminophen  (TYLENOL ) 500 MG tablet Take 2 tablets by mouth every 6 (six) hours as needed for mild pain (pain score 1-3).     amiodarone  (PACERONE ) 200 MG tablet Take 1 tablet (200 mg total) by mouth daily. 30 tablet 1   ascorbic acid (VITAMIN C) 500 MG tablet Take 500 mg by mouth daily.     aspirin 81 MG chewable tablet Chew 81 mg by mouth daily.     atorvastatin  (LIPITOR) 80 MG tablet Take 1 tablet (80 mg total) by mouth at bedtime. 90 tablet 3   Boswellia-Glucosamine-Vit D (OSTEO BI-FLEX ONE PER DAY PO) Take 1 tablet by mouth daily.     clopidogrel  (PLAVIX ) 75 MG tablet Take 1 tablet (75 mg total) by mouth daily. 30 tablet 1   ezetimibe  (ZETIA ) 10 MG tablet Take 1 tablet (10 mg total) by mouth at  bedtime. 90 tablet 3   losartan  (COZAAR ) 25 MG tablet Take a whole (25 mg) tab in the morning and a half (12.5 mg) a tablet with supper.     metFORMIN  (GLUCOPHAGE ) 500 MG tablet Take 1 tablet (500 mg total) by mouth 2 (two) times daily with a meal. 180 tablet 3   metoprolol  tartrate (LOPRESSOR ) 25 MG tablet Take 0.5 tablets (12.5 mg total) by mouth 2 (two) times daily. 90 tablet 3   mirtazapine (REMERON SOL-TAB) 15 MG disintegrating tablet Take 15 mg by mouth at bedtime. 1/2 tab po qhs     omeprazole  (PRILOSEC) 20 MG capsule Take 1 capsule (20 mg total) by mouth every morning. 90 capsule 3   zinc gluconate 50 MG tablet Take 50 mg by mouth daily.     No facility-administered medications prior to visit.    No Known Allergies  Review of Systems  As per HPI  PE:    05/17/2024   10:50 AM 05/01/2024   10:10 AM 04/03/2024   11:14 AM  Vitals with BMI  Height 5' 8 5' 8 5' 8  Weight 191 lbs 3 oz 192 lbs 6 oz 198 lbs 10 oz  BMI 29.08 29.26 30.2  Systolic 107 132 94  Diastolic 62 70 60  Pulse 59 58 66     Physical Exam  Gen: Alert, well appearing.  Patient is oriented to person, place, time, and situation. AFFECT: pleasant, lucid thought and speech. DRE: Moderate symmetric prostate enlargement, no nodule or induration or tenderness.  LABS:  Last CBC Lab Results  Component Value Date   WBC 5.8 04/03/2024   HGB 12.7 (L) 04/03/2024   HCT 39.1 04/03/2024   MCV 90.3 04/03/2024   MCH 30.0 12/23/2022   RDW 15.3 04/03/2024   PLT 377.0 04/03/2024   Last metabolic panel Lab Results  Component Value Date   GLUCOSE 164 (H) 04/24/2024   NA 138 04/24/2024   K 4.3 04/24/2024   CL 102 04/24/2024   CO2 28 04/24/2024   BUN 17 04/24/2024   CREATININE 1.08 04/24/2024   GFR 66.12 04/24/2024   CALCIUM  9.6 04/24/2024   PROT 7.2 04/03/2024   ALBUMIN 4.3 04/03/2024   BILITOT 0.8 04/03/2024   ALKPHOS 200 (H) 04/03/2024   AST 20 04/03/2024   ALT 17 04/03/2024   ANIONGAP 11 12/20/2022    Last lipids Lab Results  Component Value Date   CHOL 236 (H) 08/23/2019   HDL 51.10 08/23/2019   LDLCALC 152 (H) 08/23/2019   LDLDIRECT 159.0 08/16/2017   TRIG 164.0 (H) 08/23/2019   CHOLHDL 5 08/23/2019   Last hemoglobin A1c  Lab Results  Component Value Date   HGBA1C 9.0 (A) 01/01/2024   HGBA1C 9.0 01/01/2024   HGBA1C 9.0 (A) 01/01/2024   HGBA1C 9.0 (A) 01/01/2024   Last thyroid  functions Lab Results  Component Value Date   TSH 5.45 04/03/2024   Lab Results  Component Value Date   PSA 5.21 (H) 05/17/2024   PSA 7.37 06/26/2020   PSA 6.68 12/27/2019   IMPRESSION AND PLAN:  #1 gross hematuria. UA today with trace protein, otherwise normal.   Sent for culture for completeness. Refer to urology for consideration of CT imaging and cystoscopy (he saw Dr. Derrick Fling for his PSA elevations in the past). At this point we will go ahead and continue aspirin and Plavix . Check PSA today.  2.  TIA. Carotid Dopplers when he was in the hospital for his CABG 2 months ago showed Hemodynamically significant stenosis in both internal carotid arteries. He also had postoperative A-fib. Will check 14-day ZIO monitoring. He is on appropriate medical therapy: Atorvastatin  80 mg a day, aspirin 81 mg a day, and Plavix  75 mg a day. If outpatient monitoring normal then we will refer to vascular surgery.  3.  Coronary artery disease with recent history of CABG. He is doing very well, about to start cardiac rehab. He just got established with a cardiologist with [facility] but has an appointment with Dr. Starr Eddy with [facility] cardiology on 06/25/2024.  He wants to keep this appointment and I encouraged him to do so as well.  ?psa, ?cbc, UA+micro, clx, Has seen [facility] in [place]  Spent 50 min with pt today reviewing HPI, reviewing relevant past history, doing exam, reviewing and discussing lab and imaging data, and formulating plans.  An After Visit Summary was printed and given to the  patient.  FOLLOW UP: No follow-ups on file. Has appointment already set for 06/03/2024 Signed:  Arletha Lady, MD           05/17/2024

## 2024-05-18 LAB — URINE CULTURE
MICRO NUMBER:: 16606458
Result:: NO GROWTH
SPECIMEN QUALITY:: ADEQUATE

## 2024-05-19 ENCOUNTER — Ambulatory Visit: Payer: Self-pay | Admitting: Family Medicine

## 2024-05-20 ENCOUNTER — Encounter: Payer: Self-pay | Admitting: Family Medicine

## 2024-05-22 ENCOUNTER — Telehealth (HOSPITAL_COMMUNITY): Payer: Self-pay

## 2024-05-22 NOTE — Telephone Encounter (Signed)
 Called patient to confirm Cardiac Rehab appointment tomorrow. RN completed Nursing Assessment with patient. Directions and instructions provided for the appointment. Pt understands without assistance.

## 2024-05-23 ENCOUNTER — Encounter (HOSPITAL_COMMUNITY)
Admission: RE | Admit: 2024-05-23 | Discharge: 2024-05-23 | Disposition: A | Discharge status: Home / Self Care | Source: Ambulatory Visit | Attending: Cardiology | Admitting: Cardiology

## 2024-05-23 ENCOUNTER — Telehealth: Payer: Self-pay

## 2024-05-23 ENCOUNTER — Telehealth (HOSPITAL_COMMUNITY): Payer: Self-pay | Admitting: *Deleted

## 2024-05-23 VITALS — BP 104/68 | HR 45 | Ht 68.0 in | Wt 191.8 lb

## 2024-05-23 DIAGNOSIS — Z951 Presence of aortocoronary bypass graft: Secondary | ICD-10-CM | POA: Insufficient documentation

## 2024-05-23 DIAGNOSIS — I213 ST elevation (STEMI) myocardial infarction of unspecified site: Secondary | ICD-10-CM | POA: Insufficient documentation

## 2024-05-23 DIAGNOSIS — I4891 Unspecified atrial fibrillation: Secondary | ICD-10-CM | POA: Diagnosis not present

## 2024-05-23 LAB — GLUCOSE, CAPILLARY: Glucose-Capillary: 114 mg/dL — ABNORMAL HIGH (ref 70–99)

## 2024-05-23 NOTE — Progress Notes (Addendum)
 Patient here for cardiac rehab orientation. Nola cardia noted.  Heart rate Upper 30's to mid to upper 40's. Patient appears to be in sinus brady with a small p wave with PAC's.Patient asymptomatic. Blood pressure 104/68. Oxygen saturation 97% on room air. Medications reviewed. Taking as prescribed. Patient is wearing a Zio patch per his PCP Dr McGowen.  Will show today's ECG tracings to onsite provider Katlyn West NP for review and also. notify patient's primary care provider who referred the patient for cardiac rehab.Discussed with onsite provider 12 lead ECG ordered and obtained. Katlyn West NP onsite provider  talked with the patient and reviewed 12 lead ECG which  showed marked Sinus brady with a first degree heart block. Heart rate 38. Patient remains asymptomatic. Okay to proceed with 6 minute walk test as the patient feels fine. Will contact Dr McGowen for further instructions regarding medications.   Patient completed 6 minute walk test. Norleen did have a an approximate 6 minute run of nonsustained trigeminal PVC's. Patient asymptomatic. For approximately 2 minutes. Onsite provider Katlyn West NP notified about ectopy. Will also notify patient's current cardiologist Dr Toribio at [facility]. Patient instructed to report to the ED if he has any symptoms such as feeling lightheadedness or continued slow heart rate. The patient states understanding.   Patient's primary care provider Dr Candise is not in the office today. I was able to speak with Debra Triage nurse at [facility] at  Dr Shayne office.. Will fax today's ECG tracings to [phone]. Patient was instructed to hold his evening dose  of his metoprolol  until he receives instructions from [facility]. Patient states understanding. Patient left cardiac rehab without further complaints or symptoms. Exit resting heart rate 48. ECG tracings, 12 Lead ECG and walk test data faxed.   Hadassah Elpidio Quan RN BSN

## 2024-05-23 NOTE — Telephone Encounter (Signed)
 Copied from CRM [id number]. Topic: General - Other >> May 23, 2024 11:50 AM Franky GRADE wrote: Reason for CRM: Hadassah from Cardiac Rehab is calling to speak with Dr.Mcgowen nurse [phone].  Spoke with nurse who states pt had a resting heart rate of 38 they did perform an EKG and walked pt and his heart rate increased to 69. Nurse stated she would call pts cardiologist to see what he recommends.

## 2024-05-23 NOTE — Progress Notes (Signed)
 Cardiac Rehab Medication Review   Does the patient  feel that his/her medications are working for him/her?  yes  Has the patient been experiencing any side effects to the medications prescribed?  no  Does the patient measure his/her own blood pressure or blood glucose at home?  yes   Does the patient have any problems obtaining medications due to transportation or finances?   no  Understanding of regimen: good Understanding of indications: good Potential of compliance: good    Comments: Patient feels good about his medication routine, and is compliant  with all of his medications. He checks BP daily and keeps a log, not currently checking glucose at home.     Wayne Lamb Wayne Lamb 05/23/2024 2:30 PM

## 2024-05-23 NOTE — Progress Notes (Addendum)
 Cardiac Individual Treatment Plan  Patient Details  Name: Wayne Lamb MRN: [id number] Date of Birth: [birth year] Referring Provider:   Flowsheet Row INTENSIVE CARDIAC REHAB ORIENT from 05/23/2024 in [facility]  Referring Provider Dr. Terrall (Dr. 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  03/21/24 ST elevation myocardial infarction (STEMI), unspecified artery (HCC)  Atrial fibrillation, unspecified type (HCC) - Plan: EKG 12-Lead, EKG 12-Lead  Patient's Home Medications on Admission:  Current Outpatient Medications:    acetaminophen  (TYLENOL ) 500 MG tablet, Take 2 tablets by mouth every 6 (six) hours as needed for mild pain (pain score 1-3)., Disp: , Rfl:    amiodarone  (PACERONE ) 200 MG tablet, Take 1 tablet (200 mg total) by mouth daily., Disp: 30 tablet, Rfl: 1   ascorbic acid (VITAMIN C) 500 MG tablet, Take 500 mg by mouth daily., Disp: , Rfl:    aspirin 81 MG chewable tablet, Chew 81 mg by mouth daily., Disp: , Rfl:    atorvastatin  (LIPITOR) 80 MG tablet, Take 1 tablet (80 mg total) by mouth at bedtime., Disp: 90 tablet, Rfl: 3   clopidogrel  (PLAVIX ) 75 MG tablet, Take 1 tablet (75 mg total) by mouth daily., Disp: 30 tablet, Rfl: 1   co-enzyme Q-10 30 MG capsule, Take 100 mg by mouth daily., Disp: , Rfl:    ezetimibe  (ZETIA ) 10 MG tablet, Take 1 tablet (10 mg total) by mouth at bedtime., Disp: 90 tablet, Rfl: 3   losartan  (COZAAR ) 25 MG tablet, Take a whole (25 mg) tab in the morning and a half (12.5 mg) a tablet with supper., Disp: , Rfl:    metFORMIN  (GLUCOPHAGE ) 500 MG tablet, Take 1 tablet (500 mg total) by mouth 2 (two) times daily with a meal., Disp: 180 tablet, Rfl: 3   metoprolol  tartrate (LOPRESSOR ) 25 MG tablet, Take  0.5 tablets (12.5 mg total) by mouth 2 (two) times daily., Disp: 90 tablet, Rfl: 3   mirtazapine (REMERON SOL-TAB) 15 MG disintegrating tablet, Take 15 mg by mouth at bedtime. 1/2 tab po qhs, Disp: , Rfl:    omeprazole  (PRILOSEC) 20 MG capsule, Take 1 capsule (20 mg total) by mouth every morning., Disp: 90 capsule, Rfl: 3   zinc gluconate 50 MG tablet, Take 50 mg by mouth daily., Disp: , Rfl:    Boswellia-Glucosamine-Vit D (OSTEO BI-FLEX ONE PER DAY PO), Take 1 tablet by mouth daily. (Patient not taking: Reported on 05/23/2024), Disp: , Rfl:   Past Medical History: Past Medical History:  Diagnosis Date   BPH with obstruction/lower urinary tract symptoms    Cataract    Diabetes mellitus without complication (HCC)    Dx 05/2023-->fasting gluc 200, Hba1c 7.7%   Diverticulosis    2014 colonoscopy   Elevated PSA    MRI prostate was done 2016 to eval persistently rising PSAs (no biopsy had been done): this showed no sign of prostate ca.  Stable 07/2017, 01/2018, 01/2019, 12/2019, 05/2020.   Encounter for hepatitis C virus screening test for high risk patient    Done by [facility] physicians 01/18/17   Hypercholesterolemia    Leg DVT (deep venous thromboembolism), acute, left (HCC)    11/2022   Lumbar spondylosis    MRI L spine 2005: severe facet deg, SI deg  Obesity, Class II, BMI 35-39.9    OSA on CPAP    compliant   Prostate nodule 2016   prostate nodule or ridge in R prostate lobe--w/u reassuring 2016    Pulmonary nodule    11/2022.  F/u imaging stable, no further imaging needed.   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   STEMI (ST elevation myocardial infarction) (HCC)    Vfib arretst 02/2024-->CABG   Tubular adenoma of colon 2014   recommend repeat colonoscopy 2019    Tobacco Use: Social History   Tobacco Use  Smoking Status Former   Current packs/day: 0.00   Average packs/day: 1 pack/day for 15.0 years (15.0 ttl pk-yrs)   Types: Cigarettes   Start date: 04/19/1965    Quit date: 04/19/1980   Years since quitting: 44.1  Smokeless Tobacco Never    Labs: Review Flowsheet  More data exists      Latest Ref Rng & Units 08/17/2018 02/15/2019 08/23/2019 06/28/2023 01/01/2024  Labs for ITP Cardiac and Pulmonary Rehab  Cholestrol 0 - 200 mg/dL 759  727  763  - -  LDL (calc) 0 - 99 mg/dL 830  791  847  - -  HDL-C >39.00 mg/dL 57.49  60.99  48.89  - -  Trlycerides 0.0 - 149.0 mg/dL 856.9  874.9  835.9  - -  Hemoglobin A1c 5.7 - 6.4 % 0.0 - 7.0 % 4.0 - 5.6 % 4.0 - 5.6 % 6.2  5.7  6.2  7.7  9.0  9.0  9.0  9.0     Details       Multiple values from one day are sorted in reverse-chronological order         Capillary Blood Glucose: Lab Results  Component Value Date   GLUCAP 114 (H) 05/23/2024     Exercise Target Goals: Exercise Program Goal: Individual exercise prescription set using results from initial 6 min walk test and THRR while considering  patient's activity barriers and safety.   Exercise Prescription Goal: Initial exercise prescription builds to 30-45 minutes a day of aerobic activity, 2-3 days per week.  Home exercise guidelines will be given to patient during program as part of exercise prescription that the participant will acknowledge.  Activity Barriers & Risk Stratification:  Activity Barriers & Cardiac Risk Stratification - 05/23/24 1423       Activity Barriers & Cardiac Risk Stratification   Activity Barriers Arthritis;Deconditioning;Balance Concerns;History of Falls    Cardiac Risk Stratification High          6 Minute Walk:  6 Minute Walk     Row Name 05/23/24 1418         6 Minute Walk   Phase Initial     Distance 1275 feet     Walk Time 6 minutes     # of Rest Breaks 0     MPH 2.41     METS 2.17     RPE 11     Perceived Dyspnea  0     VO2 Peak 7.58     Symptoms Yes (comment)     Comments Patient remained asymptomatic, but had PVC's during walk test only, and irregular bradycardia throughout stay.      Resting HR 45 bpm     Resting BP 104/68     Resting Oxygen Saturation  97 %     Exercise Oxygen Saturation  during 6 min walk 100 %     Max Ex.  HR 81 bpm     Max Ex. BP 124/76     2 Minute Post BP 118/72        Oxygen Initial Assessment:   Oxygen Re-Evaluation:   Oxygen Discharge (Final Oxygen Re-Evaluation):   Initial Exercise Prescription:  Initial Exercise Prescription - 05/23/24 1400       Date of Initial Exercise RX and Referring Provider   Date 05/23/24    Referring Provider Dr. Terrall (Dr. Wilbert Lamb covering)    Expected Discharge Date 08/14/24      Treadmill   MPH 1.8    Grade 0    Minutes 15    METs 2.38      Recumbant Bike   Level 1    RPM 50    Watts 10    Minutes 15    METs 1.9      Prescription Details   Frequency (times per week) 3    Duration Progress to 30 minutes of continuous aerobic without signs/symptoms of physical distress      Intensity   THRR 40-80% of Max Heartrate 57-114    Ratings of Perceived Exertion 11-13    Perceived Dyspnea 0-4      Progression   Progression Continue progressive overload as per policy without signs/symptoms or physical distress.      Resistance Training   Training Prescription Yes    Weight 3    Reps 10-15          Perform Capillary Blood Glucose checks as needed.  Exercise Prescription Changes:   Exercise Comments:   Exercise Goals and Review:   Exercise Goals     Row Name 05/23/24 1427             Exercise Goals   Increase Physical Activity Yes       Intervention Provide advice, education, support and counseling about physical activity/exercise needs.;Develop an individualized exercise prescription for aerobic and resistive training based on initial evaluation findings, risk stratification, comorbidities and participant's personal goals.       Expected Outcomes Short Term: Attend rehab on a regular basis to increase amount of physical activity.;Long Term: Exercising regularly at  least 3-5 days a week.;Long Term: Add in home exercise to make exercise part of routine and to increase amount of physical activity.       Increase Strength and Stamina Yes       Intervention Provide advice, education, support and counseling about physical activity/exercise needs.;Develop an individualized exercise prescription for aerobic and resistive training based on initial evaluation findings, risk stratification, comorbidities and participant's personal goals.       Expected Outcomes Short Term: Increase workloads from initial exercise prescription for resistance, speed, and METs.;Short Term: Perform resistance training exercises routinely during rehab and add in resistance training at home;Long Term: Improve cardiorespiratory fitness, muscular endurance and strength as measured by increased METs and functional capacity ( )       Able to understand and use rate of perceived exertion (RPE) scale Yes       Intervention Provide education and explanation on how to use RPE scale       Expected Outcomes Short Term: Able to use RPE daily in rehab to express subjective intensity level;Long Term:  Able to use RPE to guide intensity level when exercising independently       Knowledge and understanding of Target Heart Rate Range (THRR) Yes       Intervention Provide education and explanation of THRR including how the numbers  were predicted and where they are located for reference       Expected Outcomes Short Term: Able to state/look up THRR;Long Term: Able to use THRR to govern intensity when exercising independently;Short Term: Able to use daily as guideline for intensity in rehab       Understanding of Exercise Prescription Yes       Intervention Provide education, explanation, and written materials on patient's individual exercise prescription       Expected Outcomes Short Term: Able to explain program exercise prescription;Long Term: Able to explain home exercise prescription to exercise  independently          Exercise Goals Re-Evaluation :   Discharge Exercise Prescription (Final Exercise Prescription Changes):   Nutrition:  Target Goals: Understanding of nutrition guidelines, daily intake of sodium 1500mg , cholesterol 200mg , calories 30% from fat and 7% or less from saturated fats, daily to have 5 or more servings of fruits and vegetables.  Biometrics:  Pre Biometrics - 05/23/24 1428       Pre Biometrics   Height 5' 8 (1.727 m)    Weight 87 kg    Waist Circumference 39.75 inches    Hip Circumference 39 inches    Waist to Hip Ratio 1.02 %    BMI (Calculated) 29.17    Triceps Skinfold 11 mm    % Body Fat 27 %    Grip Strength 23 kg    Flexibility --   Unable to reach   Single Leg Stand 2 seconds           Nutrition Therapy Plan and Nutrition Goals:   Nutrition Assessments:  Nutrition Assessments - 05/23/24 1449       Rate Your Plate Scores   Pre Score 62         MEDIFICTS Score Key: >=70 Need to make dietary changes  40-70 Heart Healthy Diet <= 40 Therapeutic Level Cholesterol Diet   Flowsheet Row INTENSIVE CARDIAC REHAB ORIENT from 05/23/2024 in [facility]  Picture Your Plate Total Score on Admission 62   Picture Your Plate Scores: <59 Unhealthy dietary pattern with much room for improvement. 41-50 Dietary pattern unlikely to meet recommendations for good health and room for improvement. 51-60 More healthful dietary pattern, with some room for improvement.  >60 Healthy dietary pattern, although there may be some specific behaviors that could be improved.    Nutrition Goals Re-Evaluation:   Nutrition Goals Re-Evaluation:   Nutrition Goals Discharge (Final Nutrition Goals Re-Evaluation):   Psychosocial: Target Goals: Acknowledge presence or absence of significant depression and/or stress, maximize coping skills, provide positive support system. Participant is able to  verbalize types and ability to use techniques and skills needed for reducing stress and depression.  Initial Review & Psychosocial Screening:  Initial Psych Review & Screening - 05/23/24 1412       Initial Review   Current issues with None Identified;Current Sleep Concerns   Some mild sleep concerns, but says it in normal for him     Family Dynamics   Good Support System? Yes   He has good support from his wife, church family and friends.     Barriers   Psychosocial barriers to participate in program There are no identifiable barriers or psychosocial needs.;The patient should benefit from training in stress management and relaxation.      Screening Interventions   Interventions Encouraged to exercise;Provide feedback about the scores to participant    Expected  Outcomes Long Term Goal: Stressors or current issues are controlled or eliminated.;Short Term goal: Identification and review with participant of any Quality of Life or Depression concerns found by scoring the questionnaire.;Long Term goal: The participant improves quality of Life and PHQ9 Scores as seen by post scores and/or verbalization of changes   He has no additional needs at this time         Quality of Life Scores:  Quality of Life - 05/23/24 1446       Quality of Life   Select Quality of Life      Quality of Life Scores   Health/Function Pre 27.37 %    Socioeconomic Pre 30 %    Psych/Spiritual Pre 29.14 %    Family Pre 28.5 %    GLOBAL Pre 28.44 %         Scores of 19 and below usually indicate a poorer quality of life in these areas.  A difference of  2-3 points is a clinically meaningful difference.  A difference of 2-3 points in the total score of the Quality of Life Index has been associated with significant improvement in overall quality of life, self-image, physical symptoms, and general health in studies assessing change in quality of life.  PHQ-9: Review Flowsheet  More data exists      05/23/2024  01/31/2024 07/04/2023 06/28/2023 01/25/2023  Depression screen PHQ 2/9  Decreased Interest 0 0 0 0 0  Down, Depressed, Hopeless 0 0 0 0 0  PHQ - 2 Score 0 0 0 0 0  Altered sleeping 1 0 - 0 -  Tired, decreased energy 1 0 - 1 -  Change in appetite 0 0 - 0 -  Feeling bad or failure about yourself  0 0 - 0 -  Trouble concentrating 0 0 - 0 -  Moving slowly or fidgety/restless 0 0 - 0 -  Suicidal thoughts 0 0 - 0 -  PHQ-9 Score 2 0 - 1 -  Difficult doing work/chores Not difficult at all Not difficult at all - Not difficult at all -   Interpretation of Total Score  Total Score Depression Severity:  1-4 = Minimal depression, 5-9 = Mild depression, 10-14 = Moderate depression, 15-19 = Moderately severe depression, 20-27 = Severe depression   Psychosocial Evaluation and Intervention:   Psychosocial Re-Evaluation:   Psychosocial Discharge (Final Psychosocial Re-Evaluation):   Vocational Rehabilitation: Provide vocational rehab assistance to qualifying candidates.   Vocational Rehab Evaluation & Intervention:  Vocational Rehab - 05/23/24 1415       Initial Vocational Rehab Evaluation & Intervention   Assessment shows need for Vocational Rehabilitation No   Patient is retired. No needs         Education: Education Goals: Education classes will be provided on a weekly basis, covering required topics. Participant will state understanding/return demonstration of topics presented.     Core Videos: Exercise    Move It!  Clinical staff conducted group or individual video education with verbal and written material and guidebook.  Patient learns the recommended Pritikin exercise program. Exercise with the goal of living a long, healthy life. Some of the health benefits of exercise include controlled diabetes, healthier blood pressure levels, improved cholesterol levels, improved heart and lung capacity, improved sleep, and better body composition. Everyone should speak with their doctor  before starting or changing an exercise routine.  Biomechanical Limitations Clinical staff conducted group or individual video education with verbal and written material and guidebook.  Patient learns how  biomechanical limitations can impact exercise and how we can mitigate and possibly overcome limitations to have an impactful and balanced exercise routine.  Body Composition Clinical staff conducted group or individual video education with verbal and written material and guidebook.  Patient learns that body composition (ratio of muscle mass to fat mass) is a key component to assessing overall fitness, rather than body weight alone. Increased fat mass, especially visceral belly fat, can put us  at increased risk for metabolic syndrome, type 2 diabetes, heart disease, and even death. It is recommended to combine diet and exercise (cardiovascular and resistance training) to improve your body composition. Seek guidance from your physician and exercise physiologist before implementing an exercise routine.  Exercise Action Plan Clinical staff conducted group or individual video education with verbal and written material and guidebook.  Patient learns the recommended strategies to achieve and enjoy long-term exercise adherence, including variety, self-motivation, self-efficacy, and positive decision making. Benefits of exercise include fitness, good health, weight management, more energy, better sleep, less stress, and overall well-being.  Medical   Heart Disease Risk Reduction Clinical staff conducted group or individual video education with verbal and written material and guidebook.  Patient learns our heart is our most vital organ as it circulates oxygen, nutrients, white blood cells, and hormones throughout the entire body, and carries waste away. Data supports a plant-based eating plan like the Pritikin Program for its effectiveness in slowing progression of and reversing heart disease. The video  provides a number of recommendations to address heart disease.   Metabolic Syndrome and Belly Fat  Clinical staff conducted group or individual video education with verbal and written material and guidebook.  Patient learns what metabolic syndrome is, how it leads to heart disease, and how one can reverse it and keep it from coming back. You have metabolic syndrome if you have 3 of the following 5 criteria: abdominal obesity, high blood pressure, high triglycerides, low HDL cholesterol, and high blood sugar.  Hypertension and Heart Disease Clinical staff conducted group or individual video education with verbal and written material and guidebook.  Patient learns that high blood pressure, or hypertension, is very common in the [place] . Hypertension is largely due to excessive salt intake, but other important risk factors include being overweight, physical inactivity, drinking too much alcohol, smoking, and not eating enough potassium from fruits and vegetables. High blood pressure is a leading risk factor for heart attack, stroke, congestive heart failure, dementia, kidney failure, and premature death. Long-term effects of excessive salt intake include stiffening of the arteries and thickening of heart muscle and organ damage. Recommendations include ways to reduce hypertension and the risk of heart disease.  Diseases of Our Time - Focusing on Diabetes Clinical staff conducted group or individual video education with verbal and written material and guidebook.  Patient learns why the best way to stop diseases of our time is prevention, through food and other lifestyle changes. Medicine (such as prescription pills and surgeries) is often only a Band-Aid on the problem, not a long-term solution. Most common diseases of our time include obesity, type 2 diabetes, hypertension, heart disease, and cancer. The Pritikin Program is recommended and has been proven to help reduce, reverse, and/or prevent the  damaging effects of metabolic syndrome.  Nutrition   Overview of the Pritikin Eating Plan  Clinical staff conducted group or individual video education with verbal and written material and guidebook.  Patient learns about the Pritikin Eating Plan for disease risk reduction. The Pritikin Eating  Plan emphasizes a wide variety of unrefined, minimally-processed carbohydrates, like fruits, vegetables, whole grains, and legumes. Go, Caution, and Stop food choices are explained. Plant-based and lean animal proteins are emphasized. Rationale provided for low sodium intake for blood pressure control, low added sugars for blood sugar stabilization, and low added fats and oils for coronary artery disease risk reduction and weight management.  Calorie Density  Clinical staff conducted group or individual video education with verbal and written material and guidebook.  Patient learns about calorie density and how it impacts the Pritikin Eating Plan. Knowing the characteristics of the food you choose will help you decide whether those foods will lead to weight gain or weight loss, and whether you want to consume more or less of them. Weight loss is usually a side effect of the Pritikin Eating Plan because of its focus on low calorie-dense foods.  Label Reading  Clinical staff conducted group or individual video education with verbal and written material and guidebook.  Patient learns about the Pritikin recommended label reading guidelines and corresponding recommendations regarding calorie density, added sugars, sodium content, and whole grains.  Dining Out - Part 1  Clinical staff conducted group or individual video education with verbal and written material and guidebook.  Patient learns that restaurant meals can be sabotaging because they can be so high in calories, fat, sodium, and/or sugar. Patient learns recommended strategies on how to positively address this and avoid unhealthy pitfalls.  Facts on Fats   Clinical staff conducted group or individual video education with verbal and written material and guidebook.  Patient learns that lifestyle modifications can be just as effective, if not more so, as many medications for lowering your risk of heart disease. A Pritikin lifestyle can help to reduce your risk of inflammation and atherosclerosis (cholesterol build-up, or plaque, in the artery walls). Lifestyle interventions such as dietary choices and physical activity address the cause of atherosclerosis. A review of the types of fats and their impact on blood cholesterol levels, along with dietary recommendations to reduce fat intake is also included.  Nutrition Action Plan  Clinical staff conducted group or individual video education with verbal and written material and guidebook.  Patient learns how to incorporate Pritikin recommendations into their lifestyle. Recommendations include planning and keeping personal health goals in mind as an important part of their success.  Healthy Mind-Set    Healthy Minds, Bodies, Hearts  Clinical staff conducted group or individual video education with verbal and written material and guidebook.  Patient learns how to identify when they are stressed. Video will discuss the impact of that stress, as well as the many benefits of stress management. Patient will also be introduced to stress management techniques. The way we think, act, and feel has an impact on our hearts.  How Our Thoughts Can Heal Our Hearts  Clinical staff conducted group or individual video education with verbal and written material and guidebook.  Patient learns that negative thoughts can cause depression and anxiety. This can result in negative lifestyle behavior and serious health problems. Cognitive behavioral therapy is an effective method to help control our thoughts in order to change and improve our emotional outlook.  Additional Videos:  Exercise    Improving Performance  Clinical  staff conducted group or individual video education with verbal and written material and guidebook.  Patient learns to use a non-linear approach by alternating intensity levels and lengths of time spent exercising to help burn more calories and lose more body fat.  Cardiovascular exercise helps improve heart health, metabolism, hormonal balance, blood sugar control, and recovery from fatigue. Resistance training improves strength, endurance, balance, coordination, reaction time, metabolism, and muscle mass. Flexibility exercise improves circulation, posture, and balance. Seek guidance from your physician and exercise physiologist before implementing an exercise routine and learn your capabilities and proper form for all exercise.  Introduction to Yoga  Clinical staff conducted group or individual video education with verbal and written material and guidebook.  Patient learns about yoga, a discipline of the coming together of mind, breath, and body. The benefits of yoga include improved flexibility, improved range of motion, better posture and core strength, increased lung function, weight loss, and positive self-image. Yoga's heart health benefits include lowered blood pressure, healthier heart rate, decreased cholesterol and triglyceride levels, improved immune function, and reduced stress. Seek guidance from your physician and exercise physiologist before implementing an exercise routine and learn your capabilities and proper form for all exercise.  Medical   Aging: Enhancing Your Quality of Life  Clinical staff conducted group or individual video education with verbal and written material and guidebook.  Patient learns key strategies and recommendations to stay in good physical health and enhance quality of life, such as prevention strategies, having an advocate, securing a Health Care Proxy and Power of Attorney, and keeping a list of medications and system for tracking them. It also discusses how to  avoid risk for bone loss.  Biology of Weight Control  Clinical staff conducted group or individual video education with verbal and written material and guidebook.  Patient learns that weight gain occurs because we consume more calories than we burn (eating more, moving less). Even if your body weight is normal, you may have higher ratios of fat compared to muscle mass. Too much body fat puts you at increased risk for cardiovascular disease, heart attack, stroke, type 2 diabetes, and obesity-related cancers. In addition to exercise, following the Pritikin Eating Plan can help reduce your risk.  Decoding Lab Results  Clinical staff conducted group or individual video education with verbal and written material and guidebook.  Patient learns that lab test reflects one measurement whose values change over time and are influenced by many factors, including medication, stress, sleep, exercise, food, hydration, pre-existing medical conditions, and more. It is recommended to use the knowledge from this video to become more involved with your lab results and evaluate your numbers to speak with your doctor.   Diseases of Our Time - Overview  Clinical staff conducted group or individual video education with verbal and written material and guidebook.  Patient learns that according to the CDC, 50% to 70% of chronic diseases (such as obesity, type 2 diabetes, elevated lipids, hypertension, and heart disease) are avoidable through lifestyle improvements including healthier food choices, listening to satiety cues, and increased physical activity.  Sleep Disorders Clinical staff conducted group or individual video education with verbal and written material and guidebook.  Patient learns how good quality and duration of sleep are important to overall health and well-being. Patient also learns about sleep disorders and how they impact health along with recommendations to address them, including discussing with a  physician.  Nutrition  Dining Out - Part 2 Clinical staff conducted group or individual video education with verbal and written material and guidebook.  Patient learns how to plan ahead and communicate in order to maximize their dining experience in a healthy and nutritious manner. Included are recommended food choices based on the type of restaurant the  patient is visiting.   Fueling a Banker conducted group or individual video education with verbal and written material and guidebook.  There is a strong connection between our food choices and our health. Diseases like obesity and type 2 diabetes are very prevalent and are in large-part due to lifestyle choices. The Pritikin Eating Plan provides plenty of food and hunger-curbing satisfaction. It is easy to follow, affordable, and helps reduce health risks.  Menu Workshop  Clinical staff conducted group or individual video education with verbal and written material and guidebook.  Patient learns that restaurant meals can sabotage health goals because they are often packed with calories, fat, sodium, and sugar. Recommendations include strategies to plan ahead and to communicate with the manager, chef, or server to help order a healthier meal.  Planning Your Eating Strategy  Clinical staff conducted group or individual video education with verbal and written material and guidebook.  Patient learns about the Pritikin Eating Plan and its benefit of reducing the risk of disease. The Pritikin Eating Plan does not focus on calories. Instead, it emphasizes high-quality, nutrient-rich foods. By knowing the characteristics of the foods, we choose, we can determine their calorie density and make informed decisions.  Targeting Your Nutrition Priorities  Clinical staff conducted group or individual video education with verbal and written material and guidebook.  Patient learns that lifestyle habits have a tremendous impact on disease  risk and progression. This video provides eating and physical activity recommendations based on your personal health goals, such as reducing LDL cholesterol, losing weight, preventing or controlling type 2 diabetes, and reducing high blood pressure.  Vitamins and Minerals  Clinical staff conducted group or individual video education with verbal and written material and guidebook.  Patient learns different ways to obtain key vitamins and minerals, including through a recommended healthy diet. It is important to discuss all supplements you take with your doctor.   Healthy Mind-Set    Smoking Cessation  Clinical staff conducted group or individual video education with verbal and written material and guidebook.  Patient learns that cigarette smoking and tobacco addiction pose a serious health risk which affects millions of people. Stopping smoking will significantly reduce the risk of heart disease, lung disease, and many forms of cancer. Recommended strategies for quitting are covered, including working with your doctor to develop a successful plan.  Culinary   Becoming a Set designer conducted group or individual video education with verbal and written material and guidebook.  Patient learns that cooking at home can be healthy, cost-effective, quick, and puts them in control. Keys to cooking healthy recipes will include looking at your recipe, assessing your equipment needs, planning ahead, making it simple, choosing cost-effective seasonal ingredients, and limiting the use of added fats, salts, and sugars.  Cooking - Breakfast and Snacks  Clinical staff conducted group or individual video education with verbal and written material and guidebook.  Patient learns how important breakfast is to satiety and nutrition through the entire day. Recommendations include key foods to eat during breakfast to help stabilize blood sugar levels and to prevent overeating at meals later in the day.  Planning ahead is also a key component.  Cooking - Educational psychologist conducted group or individual video education with verbal and written material and guidebook.  Patient learns eating strategies to improve overall health, including an approach to cook more at home. Recommendations include thinking of animal protein as a side on your  plate rather than center stage and focusing instead on lower calorie dense options like vegetables, fruits, whole grains, and plant-based proteins, such as beans. Making sauces in large quantities to freeze for later and leaving the skin on your vegetables are also recommended to maximize your experience.  Cooking - Healthy Salads and Dressing Clinical staff conducted group or individual video education with verbal and written material and guidebook.  Patient learns that vegetables, fruits, whole grains, and legumes are the foundations of the Pritikin Eating Plan. Recommendations include how to incorporate each of these in flavorful and healthy salads, and how to create homemade salad dressings. Proper handling of ingredients is also covered. Cooking - Soups and [employer] - Soups and Desserts Clinical staff conducted group or individual video education with verbal and written material and guidebook.  Patient learns that Pritikin soups and desserts make for easy, nutritious, and delicious snacks and meal components that are low in sodium, fat, sugar, and calorie density, while high in vitamins, minerals, and filling fiber. Recommendations include simple and healthy ideas for soups and desserts.   Overview     The Pritikin Solution Program Overview Clinical staff conducted group or individual video education with verbal and written material and guidebook.  Patient learns that the results of the Pritikin Program have been documented in more than 100 articles published in peer-reviewed journals, and the benefits include reducing risk factors for  (and, in some cases, even reversing) high cholesterol, high blood pressure, type 2 diabetes, obesity, and more! An overview of the three key pillars of the Pritikin Program will be covered: eating well, doing regular exercise, and having a healthy mind-set.  WORKSHOPS  Exercise: Exercise Basics: Building Your Action Plan Clinical staff led group instruction and group discussion with PowerPoint presentation and patient guidebook. To enhance the learning environment the use of posters, models and videos may be added. At the conclusion of this workshop, patients will comprehend the difference between physical activity and exercise, as well as the benefits of incorporating both, into their routine. Patients will understand the FITT (Frequency, Intensity, Time, and Type) principle and how to use it to build an exercise action plan. In addition, safety concerns and other considerations for exercise and cardiac rehab will be addressed by the presenter. The purpose of this lesson is to promote a comprehensive and effective weekly exercise routine in order to improve patients' overall level of fitness.   Managing Heart Disease: Your Path to a Healthier Heart Clinical staff led group instruction and group discussion with PowerPoint presentation and patient guidebook. To enhance the learning environment the use of posters, models and videos may be added.At the conclusion of this workshop, patients will understand the anatomy and physiology of the heart. Additionally, they will understand how Pritikin's three pillars impact the risk factors, the progression, and the management of heart disease.  The purpose of this lesson is to provide a high-level overview of the heart, heart disease, and how the Pritikin lifestyle positively impacts risk factors.  Exercise Biomechanics Clinical staff led group instruction and group discussion with PowerPoint presentation and patient guidebook. To enhance the learning  environment the use of posters, models and videos may be added. Patients will learn how the structural parts of their bodies function and how these functions impact their daily activities, movement, and exercise. Patients will learn how to promote a neutral spine, learn how to manage pain, and identify ways to improve their physical movement in order to promote healthy living. The  purpose of this lesson is to expose patients to common physical limitations that impact physical activity. Participants will learn practical ways to adapt and manage aches and pains, and to minimize their effect on regular exercise. Patients will learn how to maintain good posture while sitting, walking, and lifting.  Balance Training and Fall Prevention  Clinical staff led group instruction and group discussion with PowerPoint presentation and patient guidebook. To enhance the learning environment the use of posters, models and videos may be added. At the conclusion of this workshop, patients will understand the importance of their sensorimotor skills (vision, proprioception, and the vestibular system) in maintaining their ability to balance as they age. Patients will apply a variety of balancing exercises that are appropriate for their current level of function. Patients will understand the common causes for poor balance, possible solutions to these problems, and ways to modify their physical environment in order to minimize their fall risk. The purpose of this lesson is to teach patients about the importance of maintaining balance as they age and ways to minimize their risk of falling.  WORKSHOPS   Nutrition:  Fueling a Ship broker led group instruction and group discussion with PowerPoint presentation and patient guidebook. To enhance the learning environment the use of posters, models and videos may be added. Patients will review the foundational principles of the Pritikin Eating Plan and understand  what constitutes a serving size in each of the food groups. Patients will also learn Pritikin-friendly foods that are better choices when away from home and review make-ahead meal and snack options. Calorie density will be reviewed and applied to three nutrition priorities: weight maintenance, weight loss, and weight gain. The purpose of this lesson is to reinforce (in a group setting) the key concepts around what patients are recommended to eat and how to apply these guidelines when away from home by planning and selecting Pritikin-friendly options. Patients will understand how calorie density may be adjusted for different weight management goals.  Mindful Eating  Clinical staff led group instruction and group discussion with PowerPoint presentation and patient guidebook. To enhance the learning environment the use of posters, models and videos may be added. Patients will briefly review the concepts of the Pritikin Eating Plan and the importance of low-calorie dense foods. The concept of mindful eating will be introduced as well as the importance of paying attention to internal hunger signals. Triggers for non-hunger eating and techniques for dealing with triggers will be explored. The purpose of this lesson is to provide patients with the opportunity to review the basic principles of the Pritikin Eating Plan, discuss the value of eating mindfully and how to measure internal cues of hunger and fullness using the Hunger Scale. Patients will also discuss reasons for non-hunger eating and learn strategies to use for controlling emotional eating.  Targeting Your Nutrition Priorities Clinical staff led group instruction and group discussion with PowerPoint presentation and patient guidebook. To enhance the learning environment the use of posters, models and videos may be added. Patients will learn how to determine their genetic susceptibility to disease by reviewing their family history. Patients will gain  insight into the importance of diet as part of an overall healthy lifestyle in mitigating the impact of genetics and other environmental insults. The purpose of this lesson is to provide patients with the opportunity to assess their personal nutrition priorities by looking at their family history, their own health history and current risk factors. Patients will also be able to discuss  ways of prioritizing and modifying the Pritikin Eating Plan for their highest risk areas  Menu  Clinical staff led group instruction and group discussion with PowerPoint presentation and patient guidebook. To enhance the learning environment the use of posters, models and videos may be added. Using menus brought in from [employer], or printed from [employer], patients will apply the Pritikin dining out guidelines that were presented in the [employer] video. Patients will also be able to practice these guidelines in a variety of provided scenarios. The purpose of this lesson is to provide patients with the opportunity to practice hands-on learning of the Pritikin Dining Out guidelines with actual menus and practice scenarios.  Label Reading Clinical staff led group instruction and group discussion with PowerPoint presentation and patient guidebook. To enhance the learning environment the use of posters, models and videos may be added. Patients will review and discuss the Pritikin label reading guidelines presented in Pritikin's Label Reading Educational series video. Using fool labels brought in from local grocery stores and markets, patients will apply the label reading guidelines and determine if the packaged food meet the Pritikin guidelines. The purpose of this lesson is to provide patients with the opportunity to review, discuss, and practice hands-on learning of the Pritikin Label Reading guidelines with actual packaged food labels. Cooking School  Pritikin's [employer] are  designed to teach patients ways to prepare quick, simple, and affordable recipes at home. The importance of nutrition's role in chronic disease risk reduction is reflected in its emphasis in the overall Pritikin program. By learning how to prepare essential core Pritikin Eating Plan recipes, patients will increase control over what they eat; be able to customize the flavor of foods without the use of added salt, sugar, or fat; and improve the quality of the food they consume. By learning a set of core recipes which are easily assembled, quickly prepared, and affordable, patients are more likely to prepare more healthy foods at home. These workshops focus on convenient breakfasts, simple entres, side dishes, and desserts which can be prepared with minimal effort and are consistent with nutrition recommendations for cardiovascular risk reduction. Cooking [employer] are taught by a Armed forces logistics/support/administrative officer (RD) who has been trained by the [employer]. The chef or RD has a clear understanding of the importance of minimizing - if not completely eliminating - added fat, sugar, and sodium in recipes. Throughout the series of Cooking School Workshop sessions, patients will learn about healthy ingredients and efficient methods of cooking to build confidence in their capability to prepare    Cooking School weekly topics:  Adding Flavor- Sodium-Free  Fast and Healthy Breakfasts  Powerhouse Plant-Based Proteins  Satisfying Salads and Dressings  Simple Sides and Sauces  International Cuisine-Spotlight on the [employer] Zones  Delicious Desserts  Savory Soups  [employer] - Meals in a Astronomer Appetizers and Snacks  Comforting Weekend Breakfasts  One-Pot Wonders   Fast Evening Meals  Landscape architect Your Pritikin Plate  WORKSHOPS   Healthy Mindset (Psychosocial):  Focused Goals, Sustainable Changes Clinical staff led group instruction and group discussion  with PowerPoint presentation and patient guidebook. To enhance the learning environment the use of posters, models and videos may be added. Patients will be able to apply effective goal setting strategies to establish at least one personal goal, and then take consistent, meaningful action toward that goal. They will learn to identify common barriers to achieving personal goals  and develop strategies to overcome them. Patients will also gain an understanding of how our mind-set can impact our ability to achieve goals and the importance of cultivating a positive and growth-oriented mind-set. The purpose of this lesson is to provide patients with a deeper understanding of how to set and achieve personal goals, as well as the tools and strategies needed to overcome common obstacles which may arise along the way.  From Head to Heart: The Power of a Healthy Outlook  Clinical staff led group instruction and group discussion with PowerPoint presentation and patient guidebook. To enhance the learning environment the use of posters, models and videos may be added. Patients will be able to recognize and describe the impact of emotions and mood on physical health. They will discover the importance of self-care and explore self-care practices which may work for them. Patients will also learn how to utilize the 4 C's to cultivate a healthier outlook and better manage stress and challenges. The purpose of this lesson is to demonstrate to patients how a healthy outlook is an essential part of maintaining good health, especially as they continue their cardiac rehab journey.  Healthy Sleep for a Healthy Heart Clinical staff led group instruction and group discussion with PowerPoint presentation and patient guidebook. To enhance the learning environment the use of posters, models and videos may be added. At the conclusion of this workshop, patients will be able to demonstrate knowledge of the importance of sleep to overall  health, well-being, and quality of life. They will understand the symptoms of, and treatments for, common sleep disorders. Patients will also be able to identify daytime and nighttime behaviors which impact sleep, and they will be able to apply these tools to help manage sleep-related challenges. The purpose of this lesson is to provide patients with a general overview of sleep and outline the importance of quality sleep. Patients will learn about a few of the most common sleep disorders. Patients will also be introduced to the concept of "sleep hygiene," and discover ways to self-manage certain sleeping problems through simple daily behavior changes. Finally, the workshop will motivate patients by clarifying the links between quality sleep and their goals of heart-healthy living.   Recognizing and Reducing Stress Clinical staff led group instruction and group discussion with PowerPoint presentation and patient guidebook. To enhance the learning environment the use of posters, models and videos may be added. At the conclusion of this workshop, patients will be able to understand the types of stress reactions, differentiate between acute and chronic stress, and recognize the impact that chronic stress has on their health. They will also be able to apply different coping mechanisms, such as reframing negative self-talk. Patients will have the opportunity to practice a variety of stress management techniques, such as deep abdominal breathing, progressive muscle relaxation, and/or guided imagery.  The purpose of this lesson is to educate patients on the role of stress in their lives and to provide healthy techniques for coping with it.  Learning Barriers/Preferences:  Learning Barriers/Preferences - 05/23/24 1414       Learning Barriers/Preferences   Learning Barriers Exercise Concerns   Some balance concerns   Learning Preferences Written Material;Pictoral          Education Topics:  Knowledge  Questionnaire Score:  Knowledge Questionnaire Score - 05/23/24 1437       Knowledge Questionnaire Score   Pre Score 19/24          Core Components/Risk Factors/Patient Goals at Admission:  Personal  Goals and Risk Factors at Admission - 05/23/24 1415       Core Components/Risk Factors/Patient Goals on Admission    Weight Management Yes;Weight Loss    Intervention Weight Management: Develop a combined nutrition and exercise program designed to reach desired caloric intake, while maintaining appropriate intake of nutrient and fiber, sodium and fats, and appropriate energy expenditure required for the weight goal.;Weight Management: Provide education and appropriate resources to help participant work on and attain dietary goals.;Weight Management/Obesity: Establish reasonable short term and long term weight goals.    Admit Weight 191 lb 12.8 oz (87 kg)    Expected Outcomes Short Term: Continue to assess and modify interventions until short term weight is achieved;Long Term: Adherence to nutrition and physical activity/exercise program aimed toward attainment of established weight goal;Weight Loss: Understanding of general recommendations for a balanced deficit meal plan, which promotes 1-2 lb weight loss per week and includes a negative energy balance of [phone] kcal/d;Understanding recommendations for meals to include 15-35% energy as protein, 25-35% energy from fat, 35-60% energy from carbohydrates, less than 200mg  of dietary cholesterol, 20-35 gm of total fiber daily;Understanding of distribution of calorie intake throughout the day with the consumption of 4-5 meals/snacks    Diabetes Yes    Intervention Provide education about signs/symptoms and action to take for hypo/hyperglycemia.;Provide education about proper nutrition, including hydration, and aerobic/resistive exercise prescription along with prescribed medications to achieve blood glucose in normal ranges: Fasting glucose 65-99 mg/dL     Expected Outcomes Short Term: Participant verbalizes understanding of the signs/symptoms and immediate care of hyper/hypoglycemia, proper foot care and importance of medication, aerobic/resistive exercise and nutrition plan for blood glucose control.;Long Term: Attainment of HbA1C < 7%.    Hypertension Yes    Intervention Provide education on lifestyle modifcations including regular physical activity/exercise, weight management, moderate sodium restriction and increased consumption of fresh fruit, vegetables, and low fat dairy, alcohol moderation, and smoking cessation.;Monitor prescription use compliance.    Expected Outcomes Short Term: Continued assessment and intervention until BP is < 140/52mm HG in hypertensive participants. < 130/49mm HG in hypertensive participants with diabetes, heart failure or chronic kidney disease.;Long Term: Maintenance of blood pressure at goal levels.    Lipids Yes    Intervention Provide education and support for participant on nutrition & aerobic/resistive exercise along with prescribed medications to achieve LDL 70mg , HDL >40mg .    Expected Outcomes Short Term: Participant states understanding of desired cholesterol values and is compliant with medications prescribed. Participant is following exercise prescription and nutrition guidelines.;Long Term: Cholesterol controlled with medications as prescribed, with individualized exercise RX and with personalized nutrition plan. Value goals: LDL < 70mg , HDL > 40 mg.    Personal Goal Other Yes    Personal Goal Short: Heart healthy diet Long term: Play pickleball, go back to kettlebell training weight loss    Intervention will continue to monitor pt and progress workloads as tolerated without sign or symptom    Expected Outcomes Pt will achieve his goals          Core Components/Risk Factors/Patient Goals Review:    Core Components/Risk Factors/Patient Goals at Discharge (Final Review):    ITP Comments:  ITP  Comments     Row Name 05/23/24 1028           ITP Comments Wayne Bihari, MD: Medical Director.  Introduction to the [facility] / Intensive Cardiac Rehab.  Initial orientation packet reviewed with the patient.  Comments: Participant attended orientation for the cardiac rehabilitation program on  05/23/2024  to perform initial intake and exercise walk test. Patient introduced to the [facility] education and orientation packet was reviewed. Completed 6-minute walk test, measurements, initial ITP, and exercise prescription. Vital signs stable. Telemetry sinus-brady with 1st degree HB rhythm, PVC's and PAC's with exertion, patient remained asymptomatic. See nurse note for additional information  Service time was from 1030 to 1420.

## 2024-05-23 NOTE — Telephone Encounter (Signed)
 Spoke with Norleen and also with Adrien the triage nurse from [facility] health cardiology. Dr Toribio received the ECG tracings that were faxed. The patient was instructed to discontinue his metoprolol . Per Dr Shayne Mr Hugo may proceed with exercise next week as long as he is asymptomatic. Stylianos will report to exercise on 05/29/24.Hadassah Elpidio Quan RN BSN

## 2024-05-27 ENCOUNTER — Ambulatory Visit: Payer: Self-pay | Admitting: Cardiology

## 2024-05-29 ENCOUNTER — Encounter (HOSPITAL_COMMUNITY)
Admission: RE | Admit: 2024-05-29 | Discharge: 2024-05-29 | Disposition: A | Discharge status: Home / Self Care | Source: Ambulatory Visit | Attending: Cardiology | Admitting: Cardiology

## 2024-05-29 DIAGNOSIS — I252 Old myocardial infarction: Secondary | ICD-10-CM | POA: Diagnosis not present

## 2024-05-29 DIAGNOSIS — I213 ST elevation (STEMI) myocardial infarction of unspecified site: Secondary | ICD-10-CM

## 2024-05-29 DIAGNOSIS — Z48812 Encounter for surgical aftercare following surgery on the circulatory system: Secondary | ICD-10-CM | POA: Diagnosis not present

## 2024-05-29 DIAGNOSIS — Z951 Presence of aortocoronary bypass graft: Secondary | ICD-10-CM | POA: Insufficient documentation

## 2024-05-29 DIAGNOSIS — I4891 Unspecified atrial fibrillation: Secondary | ICD-10-CM

## 2024-05-29 LAB — GLUCOSE, CAPILLARY
Glucose-Capillary: 156 mg/dL — ABNORMAL HIGH (ref 70–99)
Glucose-Capillary: 131 mg/dL — ABNORMAL HIGH (ref 70–99)

## 2024-05-29 NOTE — Progress Notes (Signed)
 Daily Session Note  Patient Details  Name: Wayne Lamb MRN: [id number] Date of Birth: [birth year] Referring Provider:   Flowsheet Row INTENSIVE CARDIAC REHAB ORIENT from 05/23/2024 in [facility]  Referring Provider Dr. Terrall (Dr. Wilbert Bihari covering)    Encounter Date: 05/29/2024  Check In:  Session Check In - 05/29/24 1041       Check-In   Supervising physician immediately available to respond to emergencies [facility] MD immediately available    Physician(s) Lum Ran NP    Location [facility]    Staff Present Alec Finder BS, ACSM-CEP, Exercise Physiologist;Deandre Brannan, RN, Marcine Pereyra, MS, Exercise Physiologist;David Janann, MS, ACSM-CEP, CCRP, Exercise Physiologist;Olinty Valere, MS, ACSM-CEP, Exercise Physiologist;Other;Johnny Fayette, MS, Exercise Physiologist    Virtual Visit No    Medication changes reported     No    Fall or balance concerns reported    No    Tobacco Cessation No Change    Warm-up and Cool-down Performed as group-led instruction   CRP2 orientation today   Resistance Training Performed Yes    VAD Patient? No    PAD/SET Patient? No      Pain Assessment   Currently in Pain? No/denies    Pain Score 0-No pain    Multiple Pain Sites No          Capillary Blood Glucose: Results for orders placed or performed during the hospital encounter of 05/23/24 (from the past 24 hours)  Glucose, capillary     Status: Abnormal   Collection Time: 05/29/24 10:27 AM  Result Value Ref Range   Glucose-Capillary 156 (H) 70 - 99 mg/dL  Glucose, capillary     Status: Abnormal   Collection Time: 05/29/24 11:33 AM  Result Value Ref Range   Glucose-Capillary 131 (H) 70 - 99 mg/dL     Exercise Prescription Changes - 05/29/24 1031       Response to Exercise   Blood Pressure (Admit) 138/78    Blood Pressure (Exercise) 138/74    Blood Pressure (Exit) 104/60    Heart Rate (Admit)  58 bpm    Heart Rate (Exercise) 108 bpm    Heart Rate (Exit) 70 bpm    Rating of Perceived Exertion (Exercise) 8    Symptoms None    Comments Off to a good start with exercise.    Duration Continue with 30 min of aerobic exercise without signs/symptoms of physical distress.    Intensity THRR unchanged      Progression   Progression Continue to progress workloads to maintain intensity without signs/symptoms of physical distress.    Average METs 2.3      Resistance Training   Training Prescription No    Weight Relaxation day, no weights.      Interval Training   Interval Training No      Treadmill   MPH 2    Grade 0    Minutes 15    METs 2.53      Recumbant Bike   Level 1    RPM 88    Watts 23    Minutes 15    METs 2.1          Social History   Tobacco Use  Smoking Status Former   Current packs/day: 0.00   Average packs/day: 1 pack/day for 15.0 years (15.0 ttl pk-yrs)   Types: Cigarettes   Start date: 04/19/1965   Quit date: 04/19/1980   Years since  quitting: 44.1  Smokeless Tobacco Never    Goals Met:  Exercise tolerated well No report of concerns or symptoms today  Goals Unmet:  Not Applicable  Comments:  Wayne Lamb started cardiac rehab today.  Pt tolerated light exercise without difficulty. VSS, telemetry-Sinus Brady, Sinus Rhythm, occasional PVC, asymptomatic.  Medication list reconciled. Pt denies barriers to medicaiton compliance.  PSYCHOSOCIAL ASSESSMENT:  PHQ-2. Pt exhibits positive coping skills, hopeful outlook with supportive family. No psychosocial needs identified at this time, no psychosocial interventions necessary.    Pt enjoys pickelball and music.   Pt oriented to exercise equipment and routine.    Understanding verbalized.Wayne Elpidio Quan RN BSN    Dr. Wilbert Bihari is Medical Director for Cardiac Rehab at [facility].

## 2024-06-03 ENCOUNTER — Ambulatory Visit: Payer: Self-pay | Admitting: Cardiology

## 2024-06-03 ENCOUNTER — Ambulatory Visit: Admitting: Family Medicine

## 2024-06-03 ENCOUNTER — Encounter (HOSPITAL_COMMUNITY)
Admission: RE | Admit: 2024-06-03 | Discharge: 2024-06-03 | Disposition: A | Discharge status: Home / Self Care | Source: Ambulatory Visit | Attending: Cardiology | Admitting: Cardiology

## 2024-06-03 DIAGNOSIS — Z48812 Encounter for surgical aftercare following surgery on the circulatory system: Secondary | ICD-10-CM | POA: Diagnosis not present

## 2024-06-03 DIAGNOSIS — I213 ST elevation (STEMI) myocardial infarction of unspecified site: Secondary | ICD-10-CM

## 2024-06-03 DIAGNOSIS — I4891 Unspecified atrial fibrillation: Secondary | ICD-10-CM

## 2024-06-03 DIAGNOSIS — Z951 Presence of aortocoronary bypass graft: Secondary | ICD-10-CM

## 2024-06-03 LAB — GLUCOSE, CAPILLARY
Glucose-Capillary: 176 mg/dL — ABNORMAL HIGH (ref 70–99)
Glucose-Capillary: 126 mg/dL — ABNORMAL HIGH (ref 70–99)

## 2024-06-05 ENCOUNTER — Encounter (HOSPITAL_COMMUNITY)
Admission: RE | Admit: 2024-06-05 | Discharge: 2024-06-05 | Disposition: A | Discharge status: Home / Self Care | Source: Ambulatory Visit | Attending: Cardiology

## 2024-06-05 ENCOUNTER — Ambulatory Visit: Payer: Self-pay | Admitting: Cardiology

## 2024-06-05 DIAGNOSIS — I213 ST elevation (STEMI) myocardial infarction of unspecified site: Secondary | ICD-10-CM

## 2024-06-05 DIAGNOSIS — I4891 Unspecified atrial fibrillation: Secondary | ICD-10-CM

## 2024-06-05 DIAGNOSIS — Z951 Presence of aortocoronary bypass graft: Secondary | ICD-10-CM

## 2024-06-05 DIAGNOSIS — Z48812 Encounter for surgical aftercare following surgery on the circulatory system: Secondary | ICD-10-CM | POA: Diagnosis not present

## 2024-06-05 LAB — GLUCOSE, CAPILLARY: Glucose-Capillary: 106 mg/dL — ABNORMAL HIGH (ref 70–99)

## 2024-06-07 ENCOUNTER — Encounter (HOSPITAL_COMMUNITY)
Admission: RE | Admit: 2024-06-07 | Discharge: 2024-06-07 | Disposition: A | Discharge status: Home / Self Care | Source: Ambulatory Visit | Attending: Cardiology

## 2024-06-07 DIAGNOSIS — I213 ST elevation (STEMI) myocardial infarction of unspecified site: Secondary | ICD-10-CM

## 2024-06-07 DIAGNOSIS — Z951 Presence of aortocoronary bypass graft: Secondary | ICD-10-CM

## 2024-06-07 DIAGNOSIS — Z48812 Encounter for surgical aftercare following surgery on the circulatory system: Secondary | ICD-10-CM | POA: Diagnosis not present

## 2024-06-07 DIAGNOSIS — I4891 Unspecified atrial fibrillation: Secondary | ICD-10-CM

## 2024-06-10 ENCOUNTER — Encounter (HOSPITAL_COMMUNITY)
Admission: RE | Admit: 2024-06-10 | Discharge: 2024-06-10 | Disposition: A | Discharge status: Home / Self Care | Source: Ambulatory Visit | Attending: Cardiology

## 2024-06-10 DIAGNOSIS — Z951 Presence of aortocoronary bypass graft: Secondary | ICD-10-CM

## 2024-06-10 DIAGNOSIS — Z48812 Encounter for surgical aftercare following surgery on the circulatory system: Secondary | ICD-10-CM | POA: Diagnosis not present

## 2024-06-10 DIAGNOSIS — I213 ST elevation (STEMI) myocardial infarction of unspecified site: Secondary | ICD-10-CM

## 2024-06-10 NOTE — Progress Notes (Signed)
 Cardiac Individual Treatment Plan  Patient Details  Name: Wayne Lamb MRN: [id number] Date of Birth: 05-Mar-1946 Referring Provider:   Flowsheet Row INTENSIVE CARDIAC REHAB ORIENT from 05/23/2024 in [facility]  Referring Provider Dr. Terrall (Dr. Wilbert Bihari covering)    Initial Encounter Date:  Flowsheet Row INTENSIVE CARDIAC REHAB ORIENT from 05/23/2024 in [facility]  Date 05/23/24    Visit Diagnosis: 03/21/24 S/P CABG x 4  03/21/24 ST elevation myocardial infarction (STEMI), unspecified artery (HCC)  Patient's Home Medications on Admission:  Current Outpatient Medications:    acetaminophen  (TYLENOL ) 500 MG tablet, Take 2 tablets by mouth every 6 (six) hours as needed for mild pain (pain score 1-3)., Disp: , Rfl:    amiodarone  (PACERONE ) 200 MG tablet, Take 1 tablet (200 mg total) by mouth daily., Disp: 30 tablet, Rfl: 1   ascorbic acid (VITAMIN C) 500 MG tablet, Take 500 mg by mouth daily., Disp: , Rfl:    aspirin 81 MG chewable tablet, Chew 81 mg by mouth daily., Disp: , Rfl:    atorvastatin  (LIPITOR) 80 MG tablet, Take 1 tablet (80 mg total) by mouth at bedtime., Disp: 90 tablet, Rfl: 3   Boswellia-Glucosamine-Vit D (OSTEO BI-FLEX ONE PER DAY PO), Take 1 tablet by mouth daily. (Patient not taking: Reported on 05/23/2024), Disp: , Rfl:    clopidogrel  (PLAVIX ) 75 MG tablet, Take 1 tablet (75 mg total) by mouth daily., Disp: 30 tablet, Rfl: 1   co-enzyme Q-10 30 MG capsule, Take 100 mg by mouth daily., Disp: , Rfl:    ezetimibe  (ZETIA ) 10 MG tablet, Take 1 tablet (10 mg total) by mouth at bedtime., Disp: 90 tablet, Rfl: 3   losartan  (COZAAR ) 25 MG tablet, Take a whole (25 mg) tab in the morning and a half (12.5 mg) a tablet with supper., Disp: , Rfl:    metFORMIN  (GLUCOPHAGE ) 500 MG tablet, Take 1 tablet (500 mg total) by mouth 2 (two) times daily with a meal., Disp: 180  tablet, Rfl: 3   metoprolol  tartrate (LOPRESSOR ) 25 MG tablet, Take 0.5 tablets (12.5 mg total) by mouth 2 (two) times daily., Disp: 90 tablet, Rfl: 3   mirtazapine (REMERON SOL-TAB) 15 MG disintegrating tablet, Take 15 mg by mouth at bedtime. 1/2 tab po qhs, Disp: , Rfl:    omeprazole  (PRILOSEC) 20 MG capsule, Take 1 capsule (20 mg total) by mouth every morning., Disp: 90 capsule, Rfl: 3   zinc gluconate 50 MG tablet, Take 50 mg by mouth daily., Disp: , Rfl:   Past Medical History: Past Medical History:  Diagnosis Date   BPH with obstruction/lower urinary tract symptoms    Cataract    Diabetes mellitus without complication (HCC)    Dx 05/2023-->fasting gluc 200, Hba1c 7.7%   Diverticulosis    2014 colonoscopy   Elevated PSA    MRI prostate was done 2016 to eval persistently rising PSAs (no biopsy had been done): this showed no sign of prostate ca.  Stable 07/2017, 01/2018, 01/2019, 12/2019, 05/2020.   Encounter for hepatitis C virus screening test for high risk patient    Done by [facility] physicians 01/18/17   Hypercholesterolemia    Leg DVT (deep venous thromboembolism), acute, left (HCC)    11/2022   Lumbar spondylosis    MRI L spine 2005: severe facet deg, SI deg   Obesity, Class II, BMI 35-39.9    OSA on  CPAP    compliant   Prostate nodule 2016   prostate nodule or ridge in R prostate lobe--w/u reassuring 2016    Pulmonary nodule    11/2022.  F/u imaging stable, no further imaging needed.   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   STEMI (ST elevation myocardial infarction) (HCC)    Vfib arretst 02/2024-->CABG   Tubular adenoma of colon 2014   recommend repeat colonoscopy 2019    Tobacco Use: Social History   Tobacco Use  Smoking Status Former   Current packs/day: 0.00   Average packs/day: 1 pack/day for 15.0 years (15.0 ttl pk-yrs)   Types: Cigarettes   Start date: 04/19/1965   Quit date: 04/19/1980   Years since quitting: 44.1  Smokeless Tobacco Never     Labs: Review Flowsheet  More data exists      Latest Ref Rng & Units 08/17/2018 02/15/2019 08/23/2019 06/28/2023 01/01/2024  Labs for ITP Cardiac and Pulmonary Rehab  Cholestrol 0 - 200 mg/dL 759  727  763  - -  LDL (calc) 0 - 99 mg/dL 830  791  847  - -  HDL-C >39.00 mg/dL 57.49  60.99  48.89  - -  Trlycerides 0.0 - 149.0 mg/dL 856.9  874.9  835.9  - -  Hemoglobin A1c 5.7 - 6.4 % 0.0 - 7.0 % 4.0 - 5.6 % 4.0 - 5.6 % 6.2  5.7  6.2  7.7  9.0  9.0  9.0  9.0     Details       Multiple values from one day are sorted in reverse-chronological order         Capillary Blood Glucose: Lab Results  Component Value Date   GLUCAP 106 (H) 06/05/2024   GLUCAP 126 (H) 06/03/2024   GLUCAP 176 (H) 06/03/2024   GLUCAP 131 (H) 05/29/2024   GLUCAP 156 (H) 05/29/2024     Exercise Target Goals: Exercise Program Goal: Individual exercise prescription set using results from initial 6 min walk test and THRR while considering  patient's activity barriers and safety.   Exercise Prescription Goal: Initial exercise prescription builds to 30-45 minutes a day of aerobic activity, 2-3 days per week.  Home exercise guidelines will be given to patient during program as part of exercise prescription that the participant will acknowledge.  Activity Barriers & Risk Stratification:  Activity Barriers & Cardiac Risk Stratification - 05/23/24 1423       Activity Barriers & Cardiac Risk Stratification   Activity Barriers Arthritis;Deconditioning;Balance Concerns;History of Falls    Cardiac Risk Stratification High          6 Minute Walk:  6 Minute Walk     Row Name 05/23/24 1418         6 Minute Walk   Phase Initial     Distance 1275 feet     Walk Time 6 minutes     # of Rest Breaks 0     MPH 2.41     METS 2.17     RPE 11     Perceived Dyspnea  0     VO2 Peak 7.58     Symptoms Yes (comment)     Comments Patient remained asymptomatic, but had PVC's during walk test only, and irregular  bradycardia throughout stay.     Resting HR 45 bpm     Resting BP 104/68     Resting Oxygen Saturation  97 %     Exercise Oxygen  Saturation  during 6 min walk 100 %     Max Ex. HR 81 bpm     Max Ex. BP 124/76     2 Minute Post BP 118/72        Oxygen Initial Assessment:   Oxygen Re-Evaluation:   Oxygen Discharge (Final Oxygen Re-Evaluation):   Initial Exercise Prescription:  Initial Exercise Prescription - 05/23/24 1400       Date of Initial Exercise RX and Referring Provider   Date 05/23/24    Referring Provider Dr. Terrall (Dr. Wilbert Bihari covering)    Expected Discharge Date 08/14/24      Treadmill   MPH 1.8    Grade 0    Minutes 15    METs 2.38      Recumbant Bike   Level 1    RPM 50    Watts 10    Minutes 15    METs 1.9      Prescription Details   Frequency (times per week) 3    Duration Progress to 30 minutes of continuous aerobic without signs/symptoms of physical distress      Intensity   THRR 40-80% of Max Heartrate 57-114    Ratings of Perceived Exertion 11-13    Perceived Dyspnea 0-4      Progression   Progression Continue progressive overload as per policy without signs/symptoms or physical distress.      Resistance Training   Training Prescription Yes    Weight 3    Reps 10-15          Perform Capillary Blood Glucose checks as needed.  Exercise Prescription Changes:   Exercise Prescription Changes     Row Name 05/29/24 1031             Response to Exercise   Blood Pressure (Admit) 138/78       Blood Pressure (Exercise) 138/74       Blood Pressure (Exit) 104/60       Heart Rate (Admit) 58 bpm       Heart Rate (Exercise) 108 bpm       Heart Rate (Exit) 70 bpm       Rating of Perceived Exertion (Exercise) 8       Symptoms None       Comments Off to a good start with exercise.       Duration Continue with 30 min of aerobic exercise without signs/symptoms of physical distress.       Intensity THRR unchanged          Progression   Progression Continue to progress workloads to maintain intensity without signs/symptoms of physical distress.       Average METs 2.3         Resistance Training   Training Prescription No       Weight Relaxation day, no weights.         Interval Training   Interval Training No         Treadmill   MPH 2       Grade 0       Minutes 15       METs 2.53         Recumbant Bike   Level 1       RPM 88       Watts 23       Minutes 15       METs 2.1          Exercise Comments:   Exercise  Comments     Row Name 05/29/24 1134           Exercise Comments Wayne Lamb tolerated low intensity exercise well without symptoms. Oriented him to the exercise equipment and stretching routine.          Exercise Goals and Review:   Exercise Goals     Row Name 05/23/24 1427             Exercise Goals   Increase Physical Activity Yes       Intervention Provide advice, education, support and counseling about physical activity/exercise needs.;Develop an individualized exercise prescription for aerobic and resistive training based on initial evaluation findings, risk stratification, comorbidities and participant's personal goals.       Expected Outcomes Short Term: Attend rehab on a regular basis to increase amount of physical activity.;Long Term: Exercising regularly at least 3-5 days a week.;Long Term: Add in home exercise to make exercise part of routine and to increase amount of physical activity.       Increase Strength and Stamina Yes       Intervention Provide advice, education, support and counseling about physical activity/exercise needs.;Develop an individualized exercise prescription for aerobic and resistive training based on initial evaluation findings, risk stratification, comorbidities and participant's personal goals.       Expected Outcomes Short Term: Increase workloads from initial exercise prescription for resistance, speed, and METs.;Short Term: Perform resistance  training exercises routinely during rehab and add in resistance training at home;Long Term: Improve cardiorespiratory fitness, muscular endurance and strength as measured by increased METs and functional capacity ( )       Able to understand and use rate of perceived exertion (RPE) scale Yes       Intervention Provide education and explanation on how to use RPE scale       Expected Outcomes Short Term: Able to use RPE daily in rehab to express subjective intensity level;Long Term:  Able to use RPE to guide intensity level when exercising independently       Knowledge and understanding of Target Heart Rate Range (THRR) Yes       Intervention Provide education and explanation of THRR including how the numbers were predicted and where they are located for reference       Expected Outcomes Short Term: Able to state/look up THRR;Long Term: Able to use THRR to govern intensity when exercising independently;Short Term: Able to use daily as guideline for intensity in rehab       Understanding of Exercise Prescription Yes       Intervention Provide education, explanation, and written materials on patient's individual exercise prescription       Expected Outcomes Short Term: Able to explain program exercise prescription;Long Term: Able to explain home exercise prescription to exercise independently          Exercise Goals Re-Evaluation :  Exercise Goals Re-Evaluation     Row Name 05/29/24 1134             Exercise Goal Re-Evaluation   Exercise Goals Review Increase Physical Activity;Increase Strength and Stamina;Able to understand and use rate of perceived exertion (RPE) scale       Comments Wayne Lamb was able to understand and use RPE scale appropriately.       Expected Outcomes Progress workloads as tolerated to help improve cardiorespiratory fitness.          Discharge Exercise Prescription (Final Exercise Prescription Changes):  Exercise Prescription Changes - 05/29/24 1031  Response to  Exercise   Blood Pressure (Admit) 138/78    Blood Pressure (Exercise) 138/74    Blood Pressure (Exit) 104/60    Heart Rate (Admit) 58 bpm    Heart Rate (Exercise) 108 bpm    Heart Rate (Exit) 70 bpm    Rating of Perceived Exertion (Exercise) 8    Symptoms None    Comments Off to a good start with exercise.    Duration Continue with 30 min of aerobic exercise without signs/symptoms of physical distress.    Intensity THRR unchanged      Progression   Progression Continue to progress workloads to maintain intensity without signs/symptoms of physical distress.    Average METs 2.3      Resistance Training   Training Prescription No    Weight Relaxation day, no weights.      Interval Training   Interval Training No      Treadmill   MPH 2    Grade 0    Minutes 15    METs 2.53      Recumbant Bike   Level 1    RPM 88    Watts 23    Minutes 15    METs 2.1          Nutrition:  Target Goals: Understanding of nutrition guidelines, daily intake of sodium 1500mg , cholesterol 200mg , calories 30% from fat and 7% or less from saturated fats, daily to have 5 or more servings of fruits and vegetables.  Biometrics:  Pre Biometrics - 05/23/24 1428       Pre Biometrics   Height 5' 8 (1.727 m)    Weight 87 kg    Waist Circumference 39.75 inches    Hip Circumference 39 inches    Waist to Hip Ratio 1.02 %    BMI (Calculated) 29.17    Triceps Skinfold 11 mm    % Body Fat 27 %    Grip Strength 23 kg    Flexibility --   Unable to reach   Single Leg Stand 2 seconds           Nutrition Therapy Plan and Nutrition Goals:  Nutrition Therapy & Goals - 05/29/24 1053       Nutrition Therapy   Diet Heart Healthy Diet    Drug/Food Interactions Statins/Certain Fruits      Personal Nutrition Goals   Nutrition Goal Patient to identify strategies for reducing cardiovascular risk by attending the [employer] education and nutrition series weekly.    Personal Goal #2 Patient to improve  diet quality by using the plate method as a guide for meal planning to include lean protein/plant protein, fruits, vegetables, whole grains, nonfat dairy as part of a well-balanced diet.    Personal Goal #3 Patient to identify strategies for weight loss of 0.5-2.0# per week.    Comments Wayne Lamb has medical history of CABGx4, STEMI, Afib, DM2, TIA symptoms. LDL is not at goal. A1c is not at goal. Patient will benefit from participation in intensive cardiac rehab for nutrition, exercise, and lifestyle modification.      Intervention Plan   Intervention Prescribe, educate and counsel regarding individualized specific dietary modifications aiming towards targeted core components such as weight, hypertension, lipid management, diabetes, heart failure and other comorbidities.;Nutrition handout(s) given to patient.    Expected Outcomes Short Term Goal: Understand basic principles of dietary content, such as calories, fat, sodium, cholesterol and nutrients.;Long Term Goal: Adherence to prescribed nutrition plan.  Nutrition Assessments:  Nutrition Assessments - 05/23/24 1449       Rate Your Plate Scores   Pre Score 62         MEDIFICTS Score Key: >=70 Need to make dietary changes  40-70 Heart Healthy Diet <= 40 Therapeutic Level Cholesterol Diet   Flowsheet Row INTENSIVE CARDIAC REHAB ORIENT from 05/23/2024 in [facility] for Heart, Vascular, & Lung Health  Picture Your Plate Total Score on Admission 62   Picture Your Plate Scores: <59 Unhealthy dietary pattern with much room for improvement. 41-50 Dietary pattern unlikely to meet recommendations for good health and room for improvement. 51-60 More healthful dietary pattern, with some room for improvement.  >60 Healthy dietary pattern, although there may be some specific behaviors that could be improved.    Nutrition Goals Re-Evaluation:  Nutrition Goals Re-Evaluation     Row Name 05/29/24 1053              Goals   Current Weight 191 lb 12.8 oz (87 kg)       Comment cholester 253, LDL 161, HDL 44, A1c 8.1       Expected Outcome Wayne Lamb has medical history of CABGx4, STEMI, Afib, DM2, TIA symptoms. LDL is not at goal. A1c is not at goal. Patient will benefit from participation in intensive cardiac rehab for nutrition, exercise, and lifestyle modification.          Nutrition Goals Re-Evaluation:  Nutrition Goals Re-Evaluation     Row Name 05/29/24 1053             Goals   Current Weight 191 lb 12.8 oz (87 kg)       Comment cholester 253, LDL 161, HDL 44, A1c 8.1       Expected Outcome Wayne Lamb has medical history of CABGx4, STEMI, Afib, DM2, TIA symptoms. LDL is not at goal. A1c is not at goal. Patient will benefit from participation in intensive cardiac rehab for nutrition, exercise, and lifestyle modification.          Nutrition Goals Discharge (Final Nutrition Goals Re-Evaluation):  Nutrition Goals Re-Evaluation - 05/29/24 1053       Goals   Current Weight 191 lb 12.8 oz (87 kg)    Comment cholester 253, LDL 161, HDL 44, A1c 8.1    Expected Outcome Wayne Lamb has medical history of CABGx4, STEMI, Afib, DM2, TIA symptoms. LDL is not at goal. A1c is not at goal. Patient will benefit from participation in intensive cardiac rehab for nutrition, exercise, and lifestyle modification.          Psychosocial: Target Goals: Acknowledge presence or absence of significant depression and/or stress, maximize coping skills, provide positive support system. Participant is able to verbalize types and ability to use techniques and skills needed for reducing stress and depression.  Initial Review & Psychosocial Screening:  Initial Psych Review & Screening - 05/23/24 1412       Initial Review   Current issues with None Identified;Current Sleep Concerns   Some mild sleep concerns, but says it in normal for him     Family Dynamics   Good Support System? Yes   He has good support from his wife, church  family and friends.     Barriers   Psychosocial barriers to participate in program There are no identifiable barriers or psychosocial needs.;The patient should benefit from training in stress management and relaxation.      Screening Interventions   Interventions Encouraged to exercise;Provide feedback  about the scores to participant    Expected Outcomes Long Term Goal: Stressors or current issues are controlled or eliminated.;Short Term goal: Identification and review with participant of any Quality of Life or Depression concerns found by scoring the questionnaire.;Long Term goal: The participant improves quality of Life and PHQ9 Scores as seen by post scores and/or verbalization of changes   He has no additional needs at this time         Quality of Life Scores:  Quality of Life - 05/23/24 1446       Quality of Life   Select Quality of Life      Quality of Life Scores   Health/Function Pre 27.37 %    Socioeconomic Pre 30 %    Psych/Spiritual Pre 29.14 %    Family Pre 28.5 %    GLOBAL Pre 28.44 %         Scores of 19 and below usually indicate a poorer quality of life in these areas.  A difference of  2-3 points is a clinically meaningful difference.  A difference of 2-3 points in the total score of the Quality of Life Index has been associated with significant improvement in overall quality of life, self-image, physical symptoms, and general health in studies assessing change in quality of life.  PHQ-9: Review Flowsheet  More data exists      05/23/2024 01/31/2024 07/04/2023 06/28/2023 01/25/2023  Depression screen PHQ 2/9  Decreased Interest 0 0 0 0 0  Down, Depressed, Hopeless 0 0 0 0 0  PHQ - 2 Score 0 0 0 0 0  Altered sleeping 1 0 - 0 -  Tired, decreased energy 1 0 - 1 -  Change in appetite 0 0 - 0 -  Feeling bad or failure about yourself  0 0 - 0 -  Trouble concentrating 0 0 - 0 -  Moving slowly or fidgety/restless 0 0 - 0 -  Suicidal thoughts 0 0 - 0 -  PHQ-9 Score 2  0 - 1 -  Difficult doing work/chores Not difficult at all Not difficult at all - Not difficult at all -   Interpretation of Total Score  Total Score Depression Severity:  1-4 = Minimal depression, 5-9 = Mild depression, 10-14 = Moderate depression, 15-19 = Moderately severe depression, 20-27 = Severe depression   Psychosocial Evaluation and Intervention:   Psychosocial Re-Evaluation:  Psychosocial Re-Evaluation     Row Name 05/29/24 1652 06/04/24 1404           Psychosocial Re-Evaluation   Current issues with None Identified None Identified      Comments Wayne Lamb did not voice any increased concerns or stressors on his first day of exercise Wayne Lamb did not voice any increased concerns or stressors  during  exercise at cardiac rehab. Will review PHQ9 in the upcoming week      Continue Psychosocial Services  No Follow up required No Follow up required         Psychosocial Discharge (Final Psychosocial Re-Evaluation):  Psychosocial Re-Evaluation - 06/04/24 1404       Psychosocial Re-Evaluation   Current issues with None Identified    Comments Wayne Lamb did not voice any increased concerns or stressors  during  exercise at cardiac rehab. Will review PHQ9 in the upcoming week    Continue Psychosocial Services  No Follow up required          Vocational Rehabilitation: Provide vocational rehab assistance to qualifying candidates.   Vocational Rehab Evaluation &  Intervention:  Vocational Rehab - 05/23/24 1415       Initial Vocational Rehab Evaluation & Intervention   Assessment shows need for Vocational Rehabilitation No   Patient is retired. No needs         Education: Education Goals: Education classes will be provided on a weekly basis, covering required topics. Participant will state understanding/return demonstration of topics presented.    Education     Row Name 05/29/24 1500     Education   Cardiac Education Topics [employer]   Cabin crew   Weekly Topic International Cuisine- Spotlight on the [employer] Zones   Instruction Review Code 1- Verbalizes Understanding   Class Start Time 1145   Class Stop Time 1225   Class Time Calculation (min) 40 min    Row Name 06/03/24 1300     Education   Cardiac Education Topics [employer]   Geographical information systems officer Psychosocial   Psychosocial Workshop Healthy Sleep for a Healthy Heart   Instruction Review Code 1- Verbalizes Understanding   Class Start Time 1150   Class Stop Time 1230   Class Time Calculation (min) 40 min    Row Name 06/05/24 1100     Education   Cardiac Education Topics P[employer]ikin   Customer service manager   Weekly Topic Simple Sides and Sauces   Instruction Review Code 1- Verbalizes Understanding   Class Start Time 1145   Class Stop Time 1225   Class Time Calculation (min) 40 min    Row Name 06/07/24 1200     Education   Cardiac Education Topics [employer]ikin   Nurse, children's Exercise Physiologist   Select Psychosocial   Psychosocial How Our Thoughts Can Heal Our Hearts   Instruction Review Code 1- Verbalizes Understanding   Class Start Time 1153   Class Stop Time 1241   Class Time Calculation (min) 48 min    Row Name 06/10/24 1200     Education   Cardiac Education Topics [employer]in   Hospital doctor Education   General Education Hypertension and Heart Disease   Instruction Review Code 1- Verbalizes Understanding   Class Start Time 1150   Class Stop Time 1225   Class Time Calculation (min) 35 min      Core Videos: Exercise    Move It!  Clinical staff conducted group or individual video education with verbal and written material and guidebook.  Patient learns the recommended [employer]in exercise program. Exercise with the goal of living a long,  healthy life. Some of the health benefits of exercise include controlled diabetes, healthier blood pressure levels, improved cholesterol levels, improved heart and lung capacity, improved sleep, and better body composition. Everyone should speak with their doctor before starting or changing an exercise routine.  Biomechanical Limitations Clinical staff conducted group or individual video education with verbal and written material and guidebook.  Patient learns how biomechanical limitations can impact exercise and how we can mitigate and possibly overcome limitations to have an impactful and balanced exercise routine.  Body Composition Clinical staff conducted group or individual video education with verbal and written material and guidebook.  Patient learns that body composition (ratio of muscle mass to  fat mass) is a key component to assessing overall fitness, rather than body weight alone. Increased fat mass, especially visceral belly fat, can put us  at increased risk for metabolic syndrome, type 2 diabetes, heart disease, and even death. It is recommended to combine diet and exercise (cardiovascular and resistance training) to improve your body composition. Seek guidance from your physician and exercise physiologist before implementing an exercise routine.  Exercise Action Plan Clinical staff conducted group or individual video education with verbal and written material and guidebook.  Patient learns the recommended strategies to achieve and enjoy long-term exercise adherence, including variety, self-motivation, self-efficacy, and positive decision making. Benefits of exercise include fitness, good health, weight management, more energy, better sleep, less stress, and overall well-being.  Medical   Heart Disease Risk Reduction Clinical staff conducted group or individual video education with verbal and written material and guidebook.  Patient learns our heart is our most vital organ as it  circulates oxygen, nutrients, white blood cells, and hormones throughout the entire body, and carries waste away. Data supports a plant-based eating plan like the Pritikin Program for its effectiveness in slowing progression of and reversing heart disease. The video provides a number of recommendations to address heart disease.   Metabolic Syndrome and Belly Fat  Clinical staff conducted group or individual video education with verbal and written material and guidebook.  Patient learns what metabolic syndrome is, how it leads to heart disease, and how one can reverse it and keep it from coming back. You have metabolic syndrome if you have 3 of the following 5 criteria: abdominal obesity, high blood pressure, high triglycerides, low HDL cholesterol, and high blood sugar.  Hypertension and Heart Disease Clinical staff conducted group or individual video education with verbal and written material and guidebook.  Patient learns that high blood pressure, or hypertension, is very common in the [place] . Hypertension is largely due to excessive salt intake, but other important risk factors include being overweight, physical inactivity, drinking too much alcohol, smoking, and not eating enough potassium from fruits and vegetables. High blood pressure is a leading risk factor for heart attack, stroke, congestive heart failure, dementia, kidney failure, and premature death. Long-term effects of excessive salt intake include stiffening of the arteries and thickening of heart muscle and organ damage. Recommendations include ways to reduce hypertension and the risk of heart disease.  Diseases of Our Time - Focusing on Diabetes Clinical staff conducted group or individual video education with verbal and written material and guidebook.  Patient learns why the best way to stop diseases of our time is prevention, through food and other lifestyle changes. Medicine (such as prescription pills and surgeries) is often  only a Band-Aid on the problem, not a long-term solution. Most common diseases of our time include obesity, type 2 diabetes, hypertension, heart disease, and cancer. The Pritikin Program is recommended and has been proven to help reduce, reverse, and/or prevent the damaging effects of metabolic syndrome.  Nutrition   Overview of the Pritikin Eating Plan  Clinical staff conducted group or individual video education with verbal and written material and guidebook.  Patient learns about the Pritikin Eating Plan for disease risk reduction. The Pritikin Eating Plan emphasizes a wide variety of unrefined, minimally-processed carbohydrates, like fruits, vegetables, whole grains, and legumes. Go, Caution, and Stop food choices are explained. Plant-based and lean animal proteins are emphasized. Rationale provided for low sodium intake for blood pressure control, low added sugars for blood sugar stabilization, and low added  fats and oils for coronary artery disease risk reduction and weight management.  Calorie Density  Clinical staff conducted group or individual video education with verbal and written material and guidebook.  Patient learns about calorie density and how it impacts the Pritikin Eating Plan. Knowing the characteristics of the food you choose will help you decide whether those foods will lead to weight gain or weight loss, and whether you want to consume more or less of them. Weight loss is usually a side effect of the Pritikin Eating Plan because of its focus on low calorie-dense foods.  Label Reading  Clinical staff conducted group or individual video education with verbal and written material and guidebook.  Patient learns about the Pritikin recommended label reading guidelines and corresponding recommendations regarding calorie density, added sugars, sodium content, and whole grains.  Dining Out - Part 1  Clinical staff conducted group or individual video education with verbal and written  material and guidebook.  Patient learns that restaurant meals can be sabotaging because they can be so high in calories, fat, sodium, and/or sugar. Patient learns recommended strategies on how to positively address this and avoid unhealthy pitfalls.  Facts on Fats  Clinical staff conducted group or individual video education with verbal and written material and guidebook.  Patient learns that lifestyle modifications can be just as effective, if not more so, as many medications for lowering your risk of heart disease. A Pritikin lifestyle can help to reduce your risk of inflammation and atherosclerosis (cholesterol build-up, or plaque, in the artery walls). Lifestyle interventions such as dietary choices and physical activity address the cause of atherosclerosis. A review of the types of fats and their impact on blood cholesterol levels, along with dietary recommendations to reduce fat intake is also included.  Nutrition Action Plan  Clinical staff conducted group or individual video education with verbal and written material and guidebook.  Patient learns how to incorporate Pritikin recommendations into their lifestyle. Recommendations include planning and keeping personal health goals in mind as an important part of their success.  Healthy Mind-Set    Healthy Minds, Bodies, Hearts  Clinical staff conducted group or individual video education with verbal and written material and guidebook.  Patient learns how to identify when they are stressed. Video will discuss the impact of that stress, as well as the many benefits of stress management. Patient will also be introduced to stress management techniques. The way we think, act, and feel has an impact on our hearts.  How Our Thoughts Can Heal Our Hearts  Clinical staff conducted group or individual video education with verbal and written material and guidebook.  Patient learns that negative thoughts can cause depression and anxiety. This can result in  negative lifestyle behavior and serious health problems. Cognitive behavioral therapy is an effective method to help control our thoughts in order to change and improve our emotional outlook.  Additional Videos:  Exercise    Improving Performance  Clinical staff conducted group or individual video education with verbal and written material and guidebook.  Patient learns to use a non-linear approach by alternating intensity levels and lengths of time spent exercising to help burn more calories and lose more body fat. Cardiovascular exercise helps improve heart health, metabolism, hormonal balance, blood sugar control, and recovery from fatigue. Resistance training improves strength, endurance, balance, coordination, reaction time, metabolism, and muscle mass. Flexibility exercise improves circulation, posture, and balance. Seek guidance from your physician and exercise physiologist before implementing an exercise routine and learn  your capabilities and proper form for all exercise.  Introduction to Yoga  Clinical staff conducted group or individual video education with verbal and written material and guidebook.  Patient learns about yoga, a discipline of the coming together of mind, breath, and body. The benefits of yoga include improved flexibility, improved range of motion, better posture and core strength, increased lung function, weight loss, and positive self-image. Yoga's heart health benefits include lowered blood pressure, healthier heart rate, decreased cholesterol and triglyceride levels, improved immune function, and reduced stress. Seek guidance from your physician and exercise physiologist before implementing an exercise routine and learn your capabilities and proper form for all exercise.  Medical   Aging: Enhancing Your Quality of Life  Clinical staff conducted group or individual video education with verbal and written material and guidebook.  Patient learns key strategies and  recommendations to stay in good physical health and enhance quality of life, such as prevention strategies, having an advocate, securing a Health Care Proxy and Power of Attorney, and keeping a list of medications and system for tracking them. It also discusses how to avoid risk for bone loss.  Biology of Weight Control  Clinical staff conducted group or individual video education with verbal and written material and guidebook.  Patient learns that weight gain occurs because we consume more calories than we burn (eating more, moving less). Even if your body weight is normal, you may have higher ratios of fat compared to muscle mass. Too much body fat puts you at increased risk for cardiovascular disease, heart attack, stroke, type 2 diabetes, and obesity-related cancers. In addition to exercise, following the Pritikin Eating Plan can help reduce your risk.  Decoding Lab Results  Clinical staff conducted group or individual video education with verbal and written material and guidebook.  Patient learns that lab test reflects one measurement whose values change over time and are influenced by many factors, including medication, stress, sleep, exercise, food, hydration, pre-existing medical conditions, and more. It is recommended to use the knowledge from this video to become more involved with your lab results and evaluate your numbers to speak with your doctor.   Diseases of Our Time - Overview  Clinical staff conducted group or individual video education with verbal and written material and guidebook.  Patient learns that according to the CDC, 50% to 70% of chronic diseases (such as obesity, type 2 diabetes, elevated lipids, hypertension, and heart disease) are avoidable through lifestyle improvements including healthier food choices, listening to satiety cues, and increased physical activity.  Sleep Disorders Clinical staff conducted group or individual video education with verbal and written  material and guidebook.  Patient learns how good quality and duration of sleep are important to overall health and well-being. Patient also learns about sleep disorders and how they impact health along with recommendations to address them, including discussing with a physician.  Nutrition  Dining Out - Part 2 Clinical staff conducted group or individual video education with verbal and written material and guidebook.  Patient learns how to plan ahead and communicate in order to maximize their dining experience in a healthy and nutritious manner. Included are recommended food choices based on the type of restaurant the patient is visiting.   Fueling a Banker conducted group or individual video education with verbal and written material and guidebook.  There is a strong connection between our food choices and our health. Diseases like obesity and type 2 diabetes are very prevalent and are in  large-part due to lifestyle choices. The Pritikin Eating Plan provides plenty of food and hunger-curbing satisfaction. It is easy to follow, affordable, and helps reduce health risks.  Menu Workshop  Clinical staff conducted group or individual video education with verbal and written material and guidebook.  Patient learns that restaurant meals can sabotage health goals because they are often packed with calories, fat, sodium, and sugar. Recommendations include strategies to plan ahead and to communicate with the manager, chef, or server to help order a healthier meal.  Planning Your Eating Strategy  Clinical staff conducted group or individual video education with verbal and written material and guidebook.  Patient learns about the Pritikin Eating Plan and its benefit of reducing the risk of disease. The Pritikin Eating Plan does not focus on calories. Instead, it emphasizes high-quality, nutrient-rich foods. By knowing the characteristics of the foods, we choose, we can determine their  calorie density and make informed decisions.  Targeting Your Nutrition Priorities  Clinical staff conducted group or individual video education with verbal and written material and guidebook.  Patient learns that lifestyle habits have a tremendous impact on disease risk and progression. This video provides eating and physical activity recommendations based on your personal health goals, such as reducing LDL cholesterol, losing weight, preventing or controlling type 2 diabetes, and reducing high blood pressure.  Vitamins and Minerals  Clinical staff conducted group or individual video education with verbal and written material and guidebook.  Patient learns different ways to obtain key vitamins and minerals, including through a recommended healthy diet. It is important to discuss all supplements you take with your doctor.   Healthy Mind-Set    Smoking Cessation  Clinical staff conducted group or individual video education with verbal and written material and guidebook.  Patient learns that cigarette smoking and tobacco addiction pose a serious health risk which affects millions of people. Stopping smoking will significantly reduce the risk of heart disease, lung disease, and many forms of cancer. Recommended strategies for quitting are covered, including working with your doctor to develop a successful plan.  Culinary   Becoming a Set designer conducted group or individual video education with verbal and written material and guidebook.  Patient learns that cooking at home can be healthy, cost-effective, quick, and puts them in control. Keys to cooking healthy recipes will include looking at your recipe, assessing your equipment needs, planning ahead, making it simple, choosing cost-effective seasonal ingredients, and limiting the use of added fats, salts, and sugars.  Cooking - Breakfast and Snacks  Clinical staff conducted group or individual video education with verbal and  written material and guidebook.  Patient learns how important breakfast is to satiety and nutrition through the entire day. Recommendations include key foods to eat during breakfast to help stabilize blood sugar levels and to prevent overeating at meals later in the day. Planning ahead is also a key component.  Cooking - Educational psychologist conducted group or individual video education with verbal and written material and guidebook.  Patient learns eating strategies to improve overall health, including an approach to cook more at home. Recommendations include thinking of animal protein as a side on your plate rather than center stage and focusing instead on lower calorie dense options like vegetables, fruits, whole grains, and plant-based proteins, such as beans. Making sauces in large quantities to freeze for later and leaving the skin on your vegetables are also recommended to maximize your experience.  Cooking - Healthy  Salads and Dressing Clinical staff conducted group or individual video education with verbal and written material and guidebook.  Patient learns that vegetables, fruits, whole grains, and legumes are the foundations of the Pritikin Eating Plan. Recommendations include how to incorporate each of these in flavorful and healthy salads, and how to create homemade salad dressings. Proper handling of ingredients is also covered. Cooking - Soups and [employer] - Soups and Desserts Clinical staff conducted group or individual video education with verbal and written material and guidebook.  Patient learns that Pritikin soups and desserts make for easy, nutritious, and delicious snacks and meal components that are low in sodium, fat, sugar, and calorie density, while high in vitamins, minerals, and filling fiber. Recommendations include simple and healthy ideas for soups and desserts.   Overview     The Pritikin Solution Program Overview Clinical staff conducted group or  individual video education with verbal and written material and guidebook.  Patient learns that the results of the Pritikin Program have been documented in more than 100 articles published in peer-reviewed journals, and the benefits include reducing risk factors for (and, in some cases, even reversing) high cholesterol, high blood pressure, type 2 diabetes, obesity, and more! An overview of the three key pillars of the Pritikin Program will be covered: eating well, doing regular exercise, and having a healthy mind-set.  WORKSHOPS  Exercise: Exercise Basics: Building Your Action Plan Clinical staff led group instruction and group discussion with PowerPoint presentation and patient guidebook. To enhance the learning environment the use of posters, models and videos may be added. At the conclusion of this workshop, patients will comprehend the difference between physical activity and exercise, as well as the benefits of incorporating both, into their routine. Patients will understand the FITT (Frequency, Intensity, Time, and Type) principle and how to use it to build an exercise action plan. In addition, safety concerns and other considerations for exercise and cardiac rehab will be addressed by the presenter. The purpose of this lesson is to promote a comprehensive and effective weekly exercise routine in order to improve patients' overall level of fitness.   Managing Heart Disease: Your Path to a Healthier Heart Clinical staff led group instruction and group discussion with PowerPoint presentation and patient guidebook. To enhance the learning environment the use of posters, models and videos may be added.At the conclusion of this workshop, patients will understand the anatomy and physiology of the heart. Additionally, they will understand how Pritikin's three pillars impact the risk factors, the progression, and the management of heart disease.  The purpose of this lesson is to provide a high-level  overview of the heart, heart disease, and how the Pritikin lifestyle positively impacts risk factors.  Exercise Biomechanics Clinical staff led group instruction and group discussion with PowerPoint presentation and patient guidebook. To enhance the learning environment the use of posters, models and videos may be added. Patients will learn how the structural parts of their bodies function and how these functions impact their daily activities, movement, and exercise. Patients will learn how to promote a neutral spine, learn how to manage pain, and identify ways to improve their physical movement in order to promote healthy living. The purpose of this lesson is to expose patients to common physical limitations that impact physical activity. Participants will learn practical ways to adapt and manage aches and pains, and to minimize their effect on regular exercise. Patients will learn how to maintain good posture while sitting, walking, and lifting.  Balance  Training and Fall Prevention  Clinical staff led group instruction and group discussion with PowerPoint presentation and patient guidebook. To enhance the learning environment the use of posters, models and videos may be added. At the conclusion of this workshop, patients will understand the importance of their sensorimotor skills (vision, proprioception, and the vestibular system) in maintaining their ability to balance as they age. Patients will apply a variety of balancing exercises that are appropriate for their current level of function. Patients will understand the common causes for poor balance, possible solutions to these problems, and ways to modify their physical environment in order to minimize their fall risk. The purpose of this lesson is to teach patients about the importance of maintaining balance as they age and ways to minimize their risk of falling.  WORKSHOPS   Nutrition:  Fueling a Ship broker led group  instruction and group discussion with PowerPoint presentation and patient guidebook. To enhance the learning environment the use of posters, models and videos may be added. Patients will review the foundational principles of the Pritikin Eating Plan and understand what constitutes a serving size in each of the food groups. Patients will also learn Pritikin-friendly foods that are better choices when away from home and review make-ahead meal and snack options. Calorie density will be reviewed and applied to three nutrition priorities: weight maintenance, weight loss, and weight gain. The purpose of this lesson is to reinforce (in a group setting) the key concepts around what patients are recommended to eat and how to apply these guidelines when away from home by planning and selecting Pritikin-friendly options. Patients will understand how calorie density may be adjusted for different weight management goals.  Mindful Eating  Clinical staff led group instruction and group discussion with PowerPoint presentation and patient guidebook. To enhance the learning environment the use of posters, models and videos may be added. Patients will briefly review the concepts of the Pritikin Eating Plan and the importance of low-calorie dense foods. The concept of mindful eating will be introduced as well as the importance of paying attention to internal hunger signals. Triggers for non-hunger eating and techniques for dealing with triggers will be explored. The purpose of this lesson is to provide patients with the opportunity to review the basic principles of the Pritikin Eating Plan, discuss the value of eating mindfully and how to measure internal cues of hunger and fullness using the Hunger Scale. Patients will also discuss reasons for non-hunger eating and learn strategies to use for controlling emotional eating.  Targeting Your Nutrition Priorities Clinical staff led group instruction and group discussion with  PowerPoint presentation and patient guidebook. To enhance the learning environment the use of posters, models and videos may be added. Patients will learn how to determine their genetic susceptibility to disease by reviewing their family history. Patients will gain insight into the importance of diet as part of an overall healthy lifestyle in mitigating the impact of genetics and other environmental insults. The purpose of this lesson is to provide patients with the opportunity to assess their personal nutrition priorities by looking at their family history, their own health history and current risk factors. Patients will also be able to discuss ways of prioritizing and modifying the Pritikin Eating Plan for their highest risk areas  Menu  Clinical staff led group instruction and group discussion with PowerPoint presentation and patient guidebook. To enhance the learning environment the use of posters, models and videos may be added. Using menus brought in from  local restaurants, or printed from [employer], patients will apply the Pritikin dining out guidelines that were presented in the [employer] video. Patients will also be able to practice these guidelines in a variety of provided scenarios. The purpose of this lesson is to provide patients with the opportunity to practice hands-on learning of the Pritikin Dining Out guidelines with actual menus and practice scenarios.  Label Reading Clinical staff led group instruction and group discussion with PowerPoint presentation and patient guidebook. To enhance the learning environment the use of posters, models and videos may be added. Patients will review and discuss the Pritikin label reading guidelines presented in Pritikin's Label Reading Educational series video. Using fool labels brought in from local grocery stores and markets, patients will apply the label reading guidelines and determine if the packaged food meet the Pritikin  guidelines. The purpose of this lesson is to provide patients with the opportunity to review, discuss, and practice hands-on learning of the Pritikin Label Reading guidelines with actual packaged food labels. Cooking School  Pritikin's [employer] are designed to teach patients ways to prepare quick, simple, and affordable recipes at home. The importance of nutrition's role in chronic disease risk reduction is reflected in its emphasis in the overall Pritikin program. By learning how to prepare essential core Pritikin Eating Plan recipes, patients will increase control over what they eat; be able to customize the flavor of foods without the use of added salt, sugar, or fat; and improve the quality of the food they consume. By learning a set of core recipes which are easily assembled, quickly prepared, and affordable, patients are more likely to prepare more healthy foods at home. These workshops focus on convenient breakfasts, simple entres, side dishes, and desserts which can be prepared with minimal effort and are consistent with nutrition recommendations for cardiovascular risk reduction. Cooking [employer] are taught by a Armed forces logistics/support/administrative officer (RD) who has been trained by the [employer]. The chef or RD has a clear understanding of the importance of minimizing - if not completely eliminating - added fat, sugar, and sodium in recipes. Throughout the series of Cooking School Workshop sessions, patients will learn about healthy ingredients and efficient methods of cooking to build confidence in their capability to prepare    Cooking School weekly topics:  Adding Flavor- Sodium-Free  Fast and Healthy Breakfasts  Powerhouse Plant-Based Proteins  Satisfying Salads and Dressings  Simple Sides and Sauces  International Cuisine-Spotlight on the [employer] Zones  Delicious Desserts  Savory Soups  [employer] - Meals in a Astronomer Appetizers and  Snacks  Comforting Weekend Breakfasts  One-Pot Wonders   Fast Evening Meals  Landscape architect Your Pritikin Plate  WORKSHOPS   Healthy Mindset (Psychosocial):  Focused Goals, Sustainable Changes Clinical staff led group instruction and group discussion with PowerPoint presentation and patient guidebook. To enhance the learning environment the use of posters, models and videos may be added. Patients will be able to apply effective goal setting strategies to establish at least one personal goal, and then take consistent, meaningful action toward that goal. They will learn to identify common barriers to achieving personal goals and develop strategies to overcome them. Patients will also gain an understanding of how our mind-set can impact our ability to achieve goals and the importance of cultivating a positive and growth-oriented mind-set. The purpose of this lesson is to provide patients with a deeper understanding of how to set and  achieve personal goals, as well as the tools and strategies needed to overcome common obstacles which may arise along the way.  From Head to Heart: The Power of a Healthy Outlook  Clinical staff led group instruction and group discussion with PowerPoint presentation and patient guidebook. To enhance the learning environment the use of posters, models and videos may be added. Patients will be able to recognize and describe the impact of emotions and mood on physical health. They will discover the importance of self-care and explore self-care practices which may work for them. Patients will also learn how to utilize the 4 C's to cultivate a healthier outlook and better manage stress and challenges. The purpose of this lesson is to demonstrate to patients how a healthy outlook is an essential part of maintaining good health, especially as they continue their cardiac rehab journey.  Healthy Sleep for a Healthy Heart Clinical staff led group instruction and  group discussion with PowerPoint presentation and patient guidebook. To enhance the learning environment the use of posters, models and videos may be added. At the conclusion of this workshop, patients will be able to demonstrate knowledge of the importance of sleep to overall health, well-being, and quality of life. They will understand the symptoms of, and treatments for, common sleep disorders. Patients will also be able to identify daytime and nighttime behaviors which impact sleep, and they will be able to apply these tools to help manage sleep-related challenges. The purpose of this lesson is to provide patients with a general overview of sleep and outline the importance of quality sleep. Patients will learn about a few of the most common sleep disorders. Patients will also be introduced to the concept of "sleep hygiene," and discover ways to self-manage certain sleeping problems through simple daily behavior changes. Finally, the workshop will motivate patients by clarifying the links between quality sleep and their goals of heart-healthy living.   Recognizing and Reducing Stress Clinical staff led group instruction and group discussion with PowerPoint presentation and patient guidebook. To enhance the learning environment the use of posters, models and videos may be added. At the conclusion of this workshop, patients will be able to understand the types of stress reactions, differentiate between acute and chronic stress, and recognize the impact that chronic stress has on their health. They will also be able to apply different coping mechanisms, such as reframing negative self-talk. Patients will have the opportunity to practice a variety of stress management techniques, such as deep abdominal breathing, progressive muscle relaxation, and/or guided imagery.  The purpose of this lesson is to educate patients on the role of stress in their lives and to provide healthy techniques for coping with  it.  Learning Barriers/Preferences:  Learning Barriers/Preferences - 05/23/24 1414       Learning Barriers/Preferences   Learning Barriers Exercise Concerns   Some balance concerns   Learning Preferences Written Material;Pictoral          Education Topics:  Knowledge Questionnaire Score:  Knowledge Questionnaire Score - 05/23/24 1437       Knowledge Questionnaire Score   Pre Score 19/24          Core Components/Risk Factors/Patient Goals at Admission:  Personal Goals and Risk Factors at Admission - 05/23/24 1415       Core Components/Risk Factors/Patient Goals on Admission    Weight Management Yes;Weight Loss    Intervention Weight Management: Develop a combined nutrition and exercise program designed to reach desired caloric intake, while maintaining appropriate intake  of nutrient and fiber, sodium and fats, and appropriate energy expenditure required for the weight goal.;Weight Management: Provide education and appropriate resources to help participant work on and attain dietary goals.;Weight Management/Obesity: Establish reasonable short term and long term weight goals.    Admit Weight 191 lb 12.8 oz (87 kg)    Expected Outcomes Short Term: Continue to assess and modify interventions until short term weight is achieved;Long Term: Adherence to nutrition and physical activity/exercise program aimed toward attainment of established weight goal;Weight Loss: Understanding of general recommendations for a balanced deficit meal plan, which promotes 1-2 lb weight loss per week and includes a negative energy balance of [phone] kcal/d;Understanding recommendations for meals to include 15-35% energy as protein, 25-35% energy from fat, 35-60% energy from carbohydrates, less than 200mg  of dietary cholesterol, 20-35 gm of total fiber daily;Understanding of distribution of calorie intake throughout the day with the consumption of 4-5 meals/snacks    Diabetes Yes    Intervention Provide  education about signs/symptoms and action to take for hypo/hyperglycemia.;Provide education about proper nutrition, including hydration, and aerobic/resistive exercise prescription along with prescribed medications to achieve blood glucose in normal ranges: Fasting glucose 65-99 mg/dL    Expected Outcomes Short Term: Participant verbalizes understanding of the signs/symptoms and immediate care of hyper/hypoglycemia, proper foot care and importance of medication, aerobic/resistive exercise and nutrition plan for blood glucose control.;Long Term: Attainment of HbA1C < 7%.    Hypertension Yes    Intervention Provide education on lifestyle modifcations including regular physical activity/exercise, weight management, moderate sodium restriction and increased consumption of fresh fruit, vegetables, and low fat dairy, alcohol moderation, and smoking cessation.;Monitor prescription use compliance.    Expected Outcomes Short Term: Continued assessment and intervention until BP is < 140/28mm HG in hypertensive participants. < 130/28mm HG in hypertensive participants with diabetes, heart failure or chronic kidney disease.;Long Term: Maintenance of blood pressure at goal levels.    Lipids Yes    Intervention Provide education and support for participant on nutrition & aerobic/resistive exercise along with prescribed medications to achieve LDL 70mg , HDL >40mg .    Expected Outcomes Short Term: Participant states understanding of desired cholesterol values and is compliant with medications prescribed. Participant is following exercise prescription and nutrition guidelines.;Long Term: Cholesterol controlled with medications as prescribed, with individualized exercise RX and with personalized nutrition plan. Value goals: LDL < 70mg , HDL > 40 mg.    Personal Goal Other Yes    Personal Goal Short: Heart healthy diet Long term: Play pickleball, go back to kettlebell training weight loss    Intervention will continue to  monitor pt and progress workloads as tolerated without sign or symptom    Expected Outcomes Pt will achieve his goals          Core Components/Risk Factors/Patient Goals Review:   Goals and Risk Factor Review     Row Name 05/29/24 1653 06/04/24 1406           Core Components/Risk Factors/Patient Goals Review   Personal Goals Review Weight Management/Obesity;Hypertension;Lipids;Diabetes Weight Management/Obesity;Hypertension;Lipids;Diabetes      Review Wayne Lamb started cardiac rehab on 05/29/24. Vital signs  and CBG's were stable. heart rate improved as metoprolol  was discontinued. patient continues to wear a zio patch Wayne Lamb started cardiac rehab on 05/29/24. Angelica is off to a good start to exericise.  Vital signs  and CBG's were stable. heart rate improved as metoprolol  was discontinued. Wayne Lamb continues to wear a Zio patch monitor which he will turn in soon.  Expected Outcomes Wayne Lamb will continue to participate in cardiac rehab for exercise, nutrition and lifestyle modifications Wayne Lamb will continue to participate in cardiac rehab for exercise, nutrition and lifestyle modifications         Core Components/Risk Factors/Patient Goals at Discharge (Final Review):   Goals and Risk Factor Review - 06/04/24 1406       Core Components/Risk Factors/Patient Goals Review   Personal Goals Review Weight Management/Obesity;Hypertension;Lipids;Diabetes    Review Wayne Lamb started cardiac rehab on 05/29/24. Wayne Lamb is off to a good start to exericise.  Vital signs  and CBG's were stable. heart rate improved as metoprolol  was discontinued. Wayne Lamb continues to wear a Zio patch monitor which he will turn in soon.    Expected Outcomes Wayne Lamb will continue to participate in cardiac rehab for exercise, nutrition and lifestyle modifications          ITP Comments:  ITP Comments     Row Name 05/23/24 1028 05/29/24 1651 06/04/24 1359       ITP Comments Wilbert Bihari, MD: Medical Director.  Introduction to the [facility] / Intensive Cardiac Rehab.  Initial orientation packet reviewed with the patient. 30 Day ITP Review. Eyoel started cardiac rehab on 05/29/24. Heberto did well with exercise. 30 Day ITP Review. Antwoine started cardiac rehab on 05/29/24. Kamarian is off to a good start  with exercise.        Comments: See ITP Comments

## 2024-06-11 ENCOUNTER — Ambulatory Visit (INDEPENDENT_AMBULATORY_CARE_PROVIDER_SITE_OTHER): Admitting: Family Medicine

## 2024-06-11 ENCOUNTER — Encounter: Payer: Self-pay | Admitting: Family Medicine

## 2024-06-11 VITALS — BP 130/70 | HR 55 | Wt 190.2 lb

## 2024-06-11 DIAGNOSIS — Z7984 Long term (current) use of oral hypoglycemic drugs: Secondary | ICD-10-CM

## 2024-06-11 DIAGNOSIS — E119 Type 2 diabetes mellitus without complications: Secondary | ICD-10-CM | POA: Diagnosis not present

## 2024-06-11 DIAGNOSIS — E782 Mixed hyperlipidemia: Secondary | ICD-10-CM | POA: Diagnosis not present

## 2024-06-11 DIAGNOSIS — H9193 Unspecified hearing loss, bilateral: Secondary | ICD-10-CM

## 2024-06-11 LAB — LIPID PANEL
Cholesterol: 118 mg/dL (ref 0–200)
HDL: 49.2 mg/dL (ref >39.00)
LDL Cholesterol: 47 mg/dL (ref 0–99)
NonHDL: 68.31
Total CHOL/HDL Ratio: 2
Triglycerides: 106 mg/dL (ref 0.0–149.0)
VLDL: 21.2 mg/dL (ref 0.0–40.0)

## 2024-06-11 LAB — POCT GLYCOSYLATED HEMOGLOBIN (HGB A1C)
HbA1c POC (<> result, manual entry): 6.1 % (ref 4.0–5.6)
HbA1c, POC (controlled diabetic range): 6.1 % (ref 0.0–7.0)
HbA1c, POC (prediabetic range): 6.1 % (ref 5.7–6.4)
Hemoglobin A1C: 6.1 % — AB (ref 4.0–5.6)

## 2024-06-11 LAB — MICROALBUMIN / CREATININE URINE RATIO
Creatinine,U: 140 mg/dL
Microalb Creat Ratio: 20 mg/g (ref 0.0–30.0)
Microalb, Ur: 2.8 mg/dL — ABNORMAL HIGH (ref 0.0–1.9)

## 2024-06-11 MED ORDER — AMIODARONE HCL 200 MG PO TABS: 200.0000 mg | ORAL_TABLET | Freq: Every day | ORAL | 1 refills | Status: DC

## 2024-06-11 NOTE — Progress Notes (Signed)
 OFFICE VISIT  06/11/2024  CC:  Chief Complaint  Patient presents with   Medical Management of Chronic Issues    4 week follow up    Patient is a 78 y.o. male who presents for 1 month follow-up diabetes, hypertension, TIA, and gross hematuria. A/P as of last visit: #1 gross hematuria. UA today with trace protein, otherwise normal.   Sent for culture for completeness. Refer to urology for consideration of CT imaging and cystoscopy (he saw Dr. Nieves for his PSA elevations in the past). At this point we will go ahead and continue aspirin and Plavix . Check PSA today.   2.  TIA. Carotid Dopplers when he was in the hospital for his CABG 2 months ago showed Hemodynamically significant stenosis in both internal carotid arteries. He also had postoperative A-fib. Will check 14-day ZIO monitoring. He is on appropriate medical therapy: Atorvastatin  80 mg a day, aspirin 81 mg a day, and Plavix  75 mg a day. If outpatient monitoring normal then we will refer to vascular surgery.   3.  Coronary artery disease with recent history of CABG. He is doing very well, about to start cardiac rehab. He just got established with a cardiologist with [facility] but has an appointment with Dr. Randine Bihari with [facility] cardiology on 06/25/2024.  He wants to keep this appointment and I encouraged him to do so as well.  INTERIM HX: Brogen feels very well.  HR dec to 38 a couple weeks ago during cardiac rehab. Lopressor  d/c'd at that time. He was asymptomatic.  He is finished with ZIO monitor, results pending.  He has been working very hard on dietary changes to help his diabetes.  Past Medical History:  Diagnosis Date   BPH with obstruction/lower urinary tract symptoms    Cataract    Diabetes mellitus without complication (HCC)    Dx 05/2023-->fasting gluc 200, Hba1c 7.7%   Diverticulosis    2014 colonoscopy   Elevated PSA    MRI prostate was done 2016 to eval persistently rising PSAs (no biopsy had been  done): this showed no sign of prostate ca.  Stable 07/2017, 01/2018, 01/2019, 12/2019, 05/2020.   Encounter for hepatitis C virus screening test for high risk patient    Done by [facility] physicians 01/18/17   Hypercholesterolemia    Leg DVT (deep venous thromboembolism), acute, left (HCC)    11/2022   Lumbar spondylosis    MRI L spine 2005: severe facet deg, SI deg   Obesity, Class II, BMI 35-39.9    OSA on CPAP    compliant   PAF (paroxysmal atrial fibrillation) (HCC)    Postop CABG 2025   Prostate nodule 2016   prostate nodule or ridge in R prostate lobe--w/u reassuring 2016    Pulmonary nodule    11/2022.  F/u imaging stable, no further imaging needed.   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  +diverticulosis and int hem   CORONARY ARTERY BYPASS GRAFT     03/10/24 (4 vessel)   FRACTURE SURGERY     INGUINAL HERNIA REPAIR Left    right clavicle fracture     pin inserted (Dr. Deward)   TONSILLECTOMY  TRANSTHORACIC ECHOCARDIOGRAM     12/22/22 normal except grd I DD    Outpatient Medications Prior to Visit  Medication Sig Dispense Refill   acetaminophen  (TYLENOL ) 500 MG tablet Take 2 tablets by mouth every 6 (six) hours as needed for mild pain (pain score 1-3).     ascorbic acid (VITAMIN C) 500 MG tablet Take 500 mg by mouth daily.     aspirin 81 MG chewable tablet Chew 81 mg by mouth daily.     atorvastatin  (LIPITOR) 80 MG tablet Take 1 tablet (80 mg total) by mouth at bedtime. 90 tablet 3   clopidogrel  (PLAVIX ) 75 MG tablet Take 1 tablet (75 mg total) by mouth daily. 30 tablet 1   co-enzyme Q-10 30 MG capsule Take 100 mg by mouth daily.     ezetimibe  (ZETIA ) 10 MG tablet Take 1 tablet (10 mg total) by mouth at  bedtime. 90 tablet 3   losartan  (COZAAR ) 25 MG tablet Take a whole (25 mg) tab in the morning and a half (12.5 mg) a tablet with supper.     omeprazole  (PRILOSEC) 20 MG capsule Take 1 capsule (20 mg total) by mouth every morning. 90 capsule 3   zinc gluconate 50 MG tablet Take 50 mg by mouth daily.     metFORMIN  (GLUCOPHAGE ) 500 MG tablet Take 1 tablet (500 mg total) by mouth 2 (two) times daily with a meal. 180 tablet 3   amiodarone  (PACERONE ) 200 MG tablet Take 1 tablet (200 mg total) by mouth daily. 30 tablet 1   Boswellia-Glucosamine-Vit D (OSTEO BI-FLEX ONE PER DAY PO) Take 1 tablet by mouth daily. (Patient not taking: Reported on 06/11/2024)     metoprolol  tartrate (LOPRESSOR ) 25 MG tablet Take 0.5 tablets (12.5 mg total) by mouth 2 (two) times daily. (Patient not taking: Reported on 06/11/2024) 90 tablet 3   mirtazapine (REMERON SOL-TAB) 15 MG disintegrating tablet Take 15 mg by mouth at bedtime. 1/2 tab po qhs (Patient not taking: Reported on 06/11/2024)     No facility-administered medications prior to visit.    No Known Allergies  Review of Systems As per HPI  PE:    06/11/2024   10:16 AM 06/11/2024   10:01 AM 05/23/2024    2:28 PM  Vitals with BMI  Height   5' 8  Weight  190 lbs 3 oz 191 lbs 13 oz  BMI  28.93 29.17  Systolic 130 146 895  Diastolic 70 85 68  Pulse  55 45     Physical Exam  Gen: Alert, well appearing.  Patient is oriented to person, place, time, and situation. AFFECT: pleasant, lucid thought and speech. Midline sternotomy incision well-healed. Cardiovascular: Regular rhythm and rate, no murmur. Lungs clear to auscultation bilaterally. Extremities: No edema.  LABS:  Last CBC Lab Results  Component Value Date   WBC 5.8 04/03/2024   HGB 12.7 (L) 04/03/2024   HCT 39.1 04/03/2024   MCV 90.3 04/03/2024   MCH 30.0 12/23/2022   RDW 15.3 04/03/2024   PLT 377.0 04/03/2024   Last metabolic panel Lab Results  Component Value Date   GLUCOSE 164 (H)  04/24/2024   NA 138 04/24/2024   K 4.3 04/24/2024   CL 102 04/24/2024   CO2 28 04/24/2024   BUN 17 04/24/2024   CREATININE 1.08 04/24/2024   GFR 66.12 04/24/2024   CALCIUM  9.6 04/24/2024   PROT 7.2 04/03/2024   ALBUMIN 4.3 04/03/2024   BILITOT 0.8 04/03/2024   ALKPHOS 200 (H) 04/03/2024  AST 20 04/03/2024   ALT 17 04/03/2024   ANIONGAP 11 12/20/2022   Last lipids Lab Results  Component Value Date   CHOL 236 (H) 08/23/2019   HDL 51.10 08/23/2019   LDLCALC 152 (H) 08/23/2019   LDLDIRECT 159.0 08/16/2017   TRIG 164.0 (H) 08/23/2019   CHOLHDL 5 08/23/2019   Last hemoglobin A1c Lab Results  Component Value Date   HGBA1C 6.1 (A) 06/11/2024   HGBA1C 6.1 06/11/2024   HGBA1C 6.1 06/11/2024   HGBA1C 6.1 06/11/2024   Last thyroid  functions Lab Results  Component Value Date   TSH 5.45 04/03/2024   Lab Results  Component Value Date   PSA 5.21 (H) 05/17/2024   PSA 7.37 06/26/2020   PSA 6.68 12/27/2019   IMPRESSION AND PLAN:  1. diabetes without complication. POC Hba1c today is 6.1%. 03/09/2024 hemoglobin A1c 8.1%. Okay to stop metformin  (currently takes 500 mg once a day). Urine microalbumin/creatinine today..   2.  Coronary artery disease with recent history of CABG. He is doing very well with cardiac rehab.   He got estab with a cardiologist with [facility] but has an appointment to establish with with Dr. Randine Bihari with [facility] cardiology instead  (06/25/2024).    3. Hx TIA. Carotid Dopplers when he was in the hospital for his CABG 2 months ago showed Review of hospital records from MI/CABG admission shows carotid Doppler 03/18/2024 that showed 60 to 79% stenosis of the right internal and left internal carotid artery.  He also had postoperative A-fib. Zio monitoring was just completed, results pending. He is on appropriate medical therapy: Atorvastatin  80 mg a day, aspirin 81 mg a day, and Plavix  75 mg a day. If outpatient monitoring normal then we will refer to  vascular surgery.  4. gross hematuria. No further episodes. Okay to continue aspirin and Plavix . He has an appointment [facility] with [facility].  5.  Mixed hyperlipidemia. He was started on a atorvastatin  80 mg and Zetia  10 mg when he had his MI. I cannot find a lipid panel in [facility] records. Last cholesterol panel in our EMR was from 2020, LDL 152. Goal less than 70. Monitor lipid panel today. We discussed the possibility of needing PCSK9-I if LDL not at goal. He wants to avoid this medication if at all possible.  #6 decreased hearing, chronic. He requests audiology referral today--> ordered referral to [facility].  FOLLOW UP: Return in about 3 months (around 09/11/2024) for routine chronic illness f/u.  Signed:  Gerlene Hockey, MD           06/11/2024

## 2024-06-11 NOTE — Patient Instructions (Addendum)
 For Glaine:  allegra 60mg , 1 tab every morning for itching. OK to increase to 2 tabs every morning if needed. Most common side effects are drowsiness and dry mouth.

## 2024-06-12 ENCOUNTER — Ambulatory Visit: Payer: Self-pay | Admitting: Family Medicine

## 2024-06-12 ENCOUNTER — Ambulatory Visit: Payer: Self-pay | Admitting: Cardiology

## 2024-06-12 ENCOUNTER — Encounter (HOSPITAL_COMMUNITY)
Admission: RE | Admit: 2024-06-12 | Discharge: 2024-06-12 | Disposition: A | Discharge status: Home / Self Care | Source: Ambulatory Visit | Attending: Cardiology

## 2024-06-12 DIAGNOSIS — I213 ST elevation (STEMI) myocardial infarction of unspecified site: Secondary | ICD-10-CM

## 2024-06-12 DIAGNOSIS — G459 Transient cerebral ischemic attack, unspecified: Secondary | ICD-10-CM | POA: Diagnosis not present

## 2024-06-12 DIAGNOSIS — Z8679 Personal history of other diseases of the circulatory system: Secondary | ICD-10-CM | POA: Diagnosis not present

## 2024-06-12 DIAGNOSIS — Z48812 Encounter for surgical aftercare following surgery on the circulatory system: Secondary | ICD-10-CM | POA: Diagnosis not present

## 2024-06-12 DIAGNOSIS — Z951 Presence of aortocoronary bypass graft: Secondary | ICD-10-CM

## 2024-06-12 LAB — GLUCOSE, CAPILLARY: Glucose-Capillary: 131 mg/dL — ABNORMAL HIGH (ref 70–99)

## 2024-06-13 ENCOUNTER — Encounter: Payer: Self-pay | Admitting: Family Medicine

## 2024-06-13 DIAGNOSIS — Z8679 Personal history of other diseases of the circulatory system: Secondary | ICD-10-CM | POA: Diagnosis not present

## 2024-06-13 DIAGNOSIS — G459 Transient cerebral ischemic attack, unspecified: Secondary | ICD-10-CM

## 2024-06-14 ENCOUNTER — Encounter (HOSPITAL_COMMUNITY)
Admission: RE | Admit: 2024-06-14 | Discharge: 2024-06-14 | Disposition: A | Discharge status: Home / Self Care | Source: Ambulatory Visit | Attending: Cardiology | Admitting: Cardiology

## 2024-06-14 DIAGNOSIS — I213 ST elevation (STEMI) myocardial infarction of unspecified site: Secondary | ICD-10-CM

## 2024-06-14 DIAGNOSIS — Z48812 Encounter for surgical aftercare following surgery on the circulatory system: Secondary | ICD-10-CM | POA: Diagnosis not present

## 2024-06-14 DIAGNOSIS — Z951 Presence of aortocoronary bypass graft: Secondary | ICD-10-CM

## 2024-06-14 DIAGNOSIS — I4891 Unspecified atrial fibrillation: Secondary | ICD-10-CM

## 2024-06-17 ENCOUNTER — Encounter (HOSPITAL_COMMUNITY)
Admission: RE | Admit: 2024-06-17 | Discharge: 2024-06-17 | Disposition: A | Discharge status: Home / Self Care | Source: Ambulatory Visit | Attending: Cardiology | Admitting: Cardiology

## 2024-06-17 DIAGNOSIS — I4891 Unspecified atrial fibrillation: Secondary | ICD-10-CM

## 2024-06-17 DIAGNOSIS — Z48812 Encounter for surgical aftercare following surgery on the circulatory system: Secondary | ICD-10-CM | POA: Diagnosis not present

## 2024-06-17 DIAGNOSIS — Z951 Presence of aortocoronary bypass graft: Secondary | ICD-10-CM

## 2024-06-17 DIAGNOSIS — I213 ST elevation (STEMI) myocardial infarction of unspecified site: Secondary | ICD-10-CM

## 2024-06-17 NOTE — Progress Notes (Signed)
 Patient noticed to have increased PVC's, couplet's bigeminey one triplet noted. Wayne Lamb had increased his workload to a 4 on the recumbent bike. Patient asked to decreased workload back to a 3. The increased ectopy lasted about 9 minutes. Patient asymptomatic. Has a history of Bigeminal PVC's. Blood pressure 144/64. Telemetry rhythm Sinus 109 with exercise. Medications reviewed. Taking as prescribed. Will notify onsite provider  Wayne Bane NP and patient's cardiologist  Dr Toribio at [facility]. Onsite provider Wayne Bane NP notified. No new order received. Wayne said that Wayne Lamb is okay to continue exercise. Will continue to monitor the patient throughout  the program.[name] Elpidio Quan RN BSN

## 2024-06-17 NOTE — Progress Notes (Signed)
 Reviewed home exercise guidelines with [name] including endpoints, temperature precautions, target heart rate and rate of perceived exertion. [name] was previously walking 60 minutes at the gym as his mode of home exercise. Encouraged him to resume his walking 1-2 days/week in addition to exercise at cardiac rehab to achieve 150 minutes of aerobic exercise/week, and he is amenable to this. He will also use the equipment at the gym. He is exceeding his target heart rate range and having a lot of ectopy with exercise. The nurse case manager will let his cardiologist know of his heart rate/rhythm concerns. He will be starting cardiology service with Dr. Wilbert Bihari and has an appointment with her on 06/25/24. Will continue to monitor and adjust workloads accordingly. He has a smart watch to monitor his pulse at home. [name] voices understanding of instructions given.  Wayne CHRISTELLA Gal, MS, ACSM CEP

## 2024-06-19 ENCOUNTER — Encounter (HOSPITAL_COMMUNITY)
Admission: RE | Admit: 2024-06-19 | Discharge: 2024-06-19 | Disposition: A | Discharge status: Home / Self Care | Source: Ambulatory Visit | Attending: Cardiology

## 2024-06-19 DIAGNOSIS — Z48812 Encounter for surgical aftercare following surgery on the circulatory system: Secondary | ICD-10-CM | POA: Diagnosis not present

## 2024-06-19 DIAGNOSIS — Z951 Presence of aortocoronary bypass graft: Secondary | ICD-10-CM

## 2024-06-19 DIAGNOSIS — I213 ST elevation (STEMI) myocardial infarction of unspecified site: Secondary | ICD-10-CM

## 2024-06-21 ENCOUNTER — Encounter (HOSPITAL_COMMUNITY)
Admission: RE | Admit: 2024-06-21 | Discharge: 2024-06-21 | Disposition: A | Discharge status: Home / Self Care | Source: Ambulatory Visit | Attending: Cardiology | Admitting: Cardiology

## 2024-06-21 DIAGNOSIS — Z951 Presence of aortocoronary bypass graft: Secondary | ICD-10-CM

## 2024-06-21 DIAGNOSIS — I213 ST elevation (STEMI) myocardial infarction of unspecified site: Secondary | ICD-10-CM

## 2024-06-21 DIAGNOSIS — Z48812 Encounter for surgical aftercare following surgery on the circulatory system: Secondary | ICD-10-CM | POA: Diagnosis not present

## 2024-06-21 DIAGNOSIS — I4891 Unspecified atrial fibrillation: Secondary | ICD-10-CM

## 2024-06-24 ENCOUNTER — Encounter (HOSPITAL_COMMUNITY)
Admission: RE | Admit: 2024-06-24 | Discharge: 2024-06-24 | Disposition: A | Discharge status: Home / Self Care | Source: Ambulatory Visit | Attending: Cardiology

## 2024-06-24 DIAGNOSIS — I4891 Unspecified atrial fibrillation: Secondary | ICD-10-CM

## 2024-06-24 DIAGNOSIS — I213 ST elevation (STEMI) myocardial infarction of unspecified site: Secondary | ICD-10-CM

## 2024-06-24 DIAGNOSIS — Z48812 Encounter for surgical aftercare following surgery on the circulatory system: Secondary | ICD-10-CM | POA: Diagnosis not present

## 2024-06-24 DIAGNOSIS — Z951 Presence of aortocoronary bypass graft: Secondary | ICD-10-CM

## 2024-06-25 ENCOUNTER — Encounter: Payer: Self-pay | Admitting: Cardiology

## 2024-06-25 ENCOUNTER — Ambulatory Visit: Attending: Internal Medicine | Admitting: Cardiology

## 2024-06-25 ENCOUNTER — Ambulatory Visit

## 2024-06-25 VITALS — BP 128/72 | HR 58 | Ht 68.0 in | Wt 195.0 lb

## 2024-06-25 DIAGNOSIS — I4891 Unspecified atrial fibrillation: Secondary | ICD-10-CM | POA: Diagnosis not present

## 2024-06-25 DIAGNOSIS — I2510 Atherosclerotic heart disease of native coronary artery without angina pectoris: Secondary | ICD-10-CM | POA: Diagnosis not present

## 2024-06-25 DIAGNOSIS — E785 Hyperlipidemia, unspecified: Secondary | ICD-10-CM

## 2024-06-25 DIAGNOSIS — I2583 Coronary atherosclerosis due to lipid rich plaque: Secondary | ICD-10-CM | POA: Diagnosis not present

## 2024-06-25 DIAGNOSIS — I9789 Other postprocedural complications and disorders of the circulatory system, not elsewhere classified: Secondary | ICD-10-CM

## 2024-06-25 DIAGNOSIS — Z86711 Personal history of pulmonary embolism: Secondary | ICD-10-CM

## 2024-06-25 DIAGNOSIS — R001 Bradycardia, unspecified: Secondary | ICD-10-CM

## 2024-06-25 DIAGNOSIS — I469 Cardiac arrest, cause unspecified: Secondary | ICD-10-CM

## 2024-06-25 NOTE — Progress Notes (Signed)
 Cardiology CONSULT Note    Date:  06/25/2024   ID:  Wayne Lamb, Wayne Lamb [birth year], MRN [id number]  PCP:  Candise Aleene DEL, MD  Cardiologist:  Wilbert Bihari, MD   Chief Complaint  Patient presents with   New Patient (Initial Visit)    Status post inferior STEMI/V-fib arrest/CABG now here to establish cardiac care to be closer to his home    Patient Profile: Wayne Lamb is a 78 y.o. male who is being seen today for the evaluation of recent inferolateral STEMI/V-fib arrest/CABG at the request of McGowen, Aleene DEL, MD.  History of Present Illness:  Wayne Lamb is a 78 y.o. male who is being seen today for the evaluation of recent inferolateral STEMI/VT fib cardiac arrest and CABG at the request of McGowen, Aleene DEL, MD.  This is a 78 year old male who presented on 03/09/2024 with cardiac arrest while playing pickle ball by cardiologist.  Lyanne CPR was performed by cardiologist and pastor until EMS arrived.  He regained consciousness in the ambulance following defibrillation.  Cardiac cath revealed 90% ostial to proximal LAD, 80% mid LAD, 70% D1, 99% OM1 and occluded proximal RCA.  He underwent CABG with SVG to PDA, SVG to OM1, SVG to D1 and LIMA to LAD.  2D echo showed normal biventricular function with small PFO .  He had postoperative atrial fibrillation.  He did go to inpt rehab after discharge for a well.  He is now in Cardiac Rehab.  He was seen back by cardiology at [facility] and was doing well and was back to regular exercise and walking 60 minutes a day without problems.  He had plan to start cardiac rehab at [facility].  He is currently on amiodarone  with plans to stop amiodarone  in 1 year.  Current medications include aspirin, Plavix , atorvastatin , Zetia , Toprol , amiodarone  and omeprazole .  He also has a history of remote saddle pulmonary embolism in January 2024 in the setting of DVT as well as diabetes mellitus.  He is now referred by his PCP Dr.  Helon to establish cardiac care closer to home.  He denies any chest pain or pressure, shortness of breath, DOE, PND, orthopnea, lower extremity edema, dizziness, palpitations or syncope. He occasionally will feel a twinge to the right of his sternal incision but no anginal sx.  He has had some PVCs in CR.   Past Medical History:  Diagnosis Date   BPH with obstruction/lower urinary tract symptoms    Cataract    Diabetes mellitus without complication (HCC)    Dx 05/2023-->fasting gluc 200, Hba1c 7.7%   Diverticulosis    2014 colonoscopy   Elevated PSA    MRI prostate was done 2016 to eval persistently rising PSAs (no biopsy had been done): this showed no sign of prostate ca.  Stable 07/2017, 01/2018, 01/2019, 12/2019, 05/2020.   Encounter for hepatitis C virus screening test for high risk patient    Done by [facility] physicians 01/18/17   Hypercholesterolemia    Leg DVT (deep venous thromboembolism), acute, left (HCC)    11/2022   Lumbar spondylosis    MRI L spine 2005: severe facet deg, SI deg   Obesity, Class II, BMI 35-39.9    OSA on CPAP    compliant   PAF (paroxysmal atrial fibrillation) (HCC)    Postop CABG 2025   Prostate nodule 2016   prostate nodule or ridge in R prostate lobe--w/u reassuring 2016    Pulmonary nodule  11/2022.  F/u imaging stable, no further imaging needed.   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  +diverticulosis and int hem   CORONARY ARTERY BYPASS GRAFT     03/10/24 (4 vessel)   FRACTURE SURGERY     INGUINAL HERNIA REPAIR Left    right clavicle fracture     pin inserted (Dr. Deward)   TONSILLECTOMY     TRANSTHORACIC ECHOCARDIOGRAM     12/22/22 normal except grd I DD   ZIO monitor      05/2024 essentially normal    Current Medications: Current Meds  Medication Sig   amiodarone  (PACERONE ) 200 MG tablet Take 1 tablet (200 mg total) by mouth daily.   ascorbic acid (VITAMIN C) 500 MG tablet Take 500 mg by mouth daily.   aspirin 81 MG chewable tablet Chew 81 mg by mouth daily.   atorvastatin  (LIPITOR) 80 MG tablet Take 1 tablet (80 mg total) by mouth at bedtime.   clopidogrel  (PLAVIX ) 75 MG tablet Take 1 tablet (75 mg total) by mouth daily.   co-enzyme Q-10 30 MG capsule Take 100 mg by mouth daily.   ezetimibe  (ZETIA ) 10 MG tablet Take 1 tablet (10 mg total) by mouth at bedtime.   losartan  (COZAAR ) 25 MG tablet Take a whole (25 mg) tab in the morning and a half (12.5 mg) a tablet with supper.   omeprazole  (PRILOSEC) 20 MG capsule Take 1 capsule (20 mg total) by mouth every morning.   zinc gluconate 50 MG tablet Take 50 mg by mouth daily.    Allergies:   Patient has no known allergies.   Social History   Socioeconomic History   Marital status: Married    Spouse name: Not on file   Number of children: Not on file   Years of education: Not on file   Highest education level: Bachelor's degree (e.g., BA, AB, BS)  Occupational History   Not on file  Tobacco Use   Smoking status: Former    Current packs/day: 0.00    Average packs/day: 1 pack/day for 15.0 years (15.0 ttl pk-yrs)    Types: Cigarettes    Start date: 04/19/1965    Quit date: 04/19/1980    Years since quitting: 44.2   Smokeless tobacco: Never  Vaping Use   Vaping status: Never Used  Substance and Sexual Activity   Alcohol use: Yes    Alcohol/week: 1.0 standard drink of alcohol    Types: 1 Shots of liquor per week    Comment: occ   Drug use: No   Sexual activity: Not Currently  Other Topics Concern   Not on file  Social History Narrative   Married, 1 son who is an Pensions consultant in [place].   HE GREW UP IN [place] ---WENT TO [employer]!   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    Tob: former smoker, 15 pack-yr hx, quit 1970.   Alc: no   Exercise: 2-3 days per week, CV and wt training.   Social Drivers of Health   Financial Resource Strain: Low Risk  (05/16/2024)   Overall Financial Resource Strain (CARDIA)    Difficulty of Paying  Living Expenses: Not hard at all  Food Insecurity: No Food Insecurity (05/16/2024)   Hunger Vital Sign    Worried About Running Out of Food in the Last Year: Never true    Ran Out of Food in the Last Year: Never true  Transportation Needs: No Transportation Needs (05/16/2024)   PRAPARE - Administrator, Civil Service (Medical): No    Lack of Transportation (Non-Medical): No  Physical Activity: Sufficiently Active (05/16/2024)   Exercise Vital Sign    Days of Exercise per Week: 6 days    Minutes of Exercise per Session: 60 min  Stress: No Stress Concern Present (05/16/2024)   [employer] of Occupational Health - Occupational Stress Questionnaire    Feeling of Stress: Not at all  Social Connections: Socially Integrated (05/16/2024)   Social Connection and Isolation Panel    Frequency of Communication with Friends and Family: More than three times a week    Frequency of Social Gatherings with Friends and Family: Once a week    Attends Religious Services: More than 4 times per year    Active Member of [employer] or Organizations: Yes    Attends Engineer, structural: More than 4 times per year    Marital Status: Married     Family History:  The patient's family history includes Alcohol abuse in his father; Early death (age of onset: 76) in his father; Heart disease in his mother and sister; Learning disabilities in his brother.   ROS:   Please see the history of present illness.    ROS All other systems reviewed and are negative.     06/21/2024    2:39 PM  PAD Screen  Previous PAD dx? No  Previous surgical procedure? No  Pain with walking? No  Feet/toe relief with dangling? No  Painful, non-healing ulcers? No   Extremities discolored? No       PHYSICAL EXAM:   VS:  BP 128/72   Pulse (!) 58   Ht 5' 8 (1.727 m)   Wt 195 lb (88.5 kg)   SpO2 98%   BMI 29.65 kg/m    GEN: Well nourished, well developed, in no acute distress  HEENT: normal  Neck: no JVD, carotid bruits, or masses Cardiac: RRR; no murmurs, rubs, or gallops,no edema.  Intact distal pulses bilaterally.  Respiratory:  clear to auscultation bilaterally, normal work of breathing GI: soft, nontender, nondistended, + BS MS: no deformity or atrophy  Skin: warm and dry, no rash Neuro:  Alert and Oriented x 3, Strength and sensation are intact Psych: euthymic mood, full affect  Wt Readings from Last 3 Encounters:  06/25/24 195 lb (88.5 kg)  06/11/24 190 lb 3.2 oz (86.3 kg)  05/23/24 191 lb 12.8 oz (87 kg)      Studies/Labs Reviewed:          Recent Labs: 04/03/2024: ALT 17; Hemoglobin 12.7; Platelets 377.0; TSH 5.45 04/24/2024: BUN 17; Creatinine, Ser 1.08; Potassium 4.3; Sodium 138   Lipid Panel    Component Value Date/Time   CHOL 118 06/11/2024 1051   TRIG 106.0 06/11/2024 1051   HDL 49.20 06/11/2024 1051   CHOLHDL 2 06/11/2024 1051   VLDL 21.2 06/11/2024 1051   LDLCALC 47 06/11/2024 1051   LDLDIRECT 159.0 08/16/2017 1130     Additional studies/ records that were reviewed today include:  Office notes and [facility] cardiology as well as cardiac surgery op note, 2D echo at [facility] during his hospitalization in April  2025    ASSESSMENT:    1. Coronary artery disease due to lipid rich plaque   2. Cardiac arrest (HCC)   3. Postoperative atrial fibrillation (HCC)   4. Hyperlipidemia LDL goal <70   5. History of pulmonary embolism   6. Coronary artery disease involving native coronary artery of native heart without angina pectoris      PLAN:  In order of problems listed above:  #ASCAD #Status post V-fib arrest in the setting of inferolateral STEMI - Admitted after V-fib arrest while playing  pickle ball 03/09/2024 with prompt CPR by bystanders  - cardiac cath revealed 90% ostial to proximal LAD, 80% mid LAD, 70% D1, 99% OM1 and occluded proximal RCA.   - He underwent CABG with SVG to PDA, SVG to OM1, SVG to D1 and LIMA to LAD. - 2D echo showed normal biventricular function with small PFO . - Doing well post CABG without any complaints of angina.  He is walking 60 minutes a day without any problems. - Continue Plavix  75mg  daily, atorvastatin  80 mg daily - no BB due to resting bradycardia - He is participating in CR - will repeat 3day zio in 3 months after washout of Amio and if avg HR improved then will start low dose BB - repeat 2D echo since he has not had once since CABG  #Postoperative atrial fibrillation - He has not had any recurrence - recent 2 week ziopatch showed no afib and therefore since he is 3 months out from CABG I will discontinue amiodarone   - No indication at this time for anticoagulation  #Hyperlipidemia - LDL goal less than 70 -I have personally reviewed and interpreted outside labs performed by patient's PCP which showed LDL 47, HDL 49 on 06/11/2024 and ALT 17 on 04/03/2024 - Continue Zetia  10 mg daily and atorvastatin  80 mg daily with as needed refills  #History of pulmonary embolism #History of DVT - He had a saddle embolus in the setting of DVT in January 2024 - He was on anticoagulation for 6 months which was then stopped  #HTN -BP controlled today -continue Losartan  25mg  daily  Time Spent: 25 minutes total time of encounter, including 15 minutes spent in face-to-face patient care on the date of this encounter. This time includes coordination of care and counseling regarding above mentioned problem list. Remainder of non-face-to-face time involved reviewing chart documents/testing relevant to the patient encounter and documentation in the medical record. I have independently reviewed documentation from referring provider  Followup:  6  months  Medication Adjustments/Labs and Tests Ordered: Current medicines are reviewed at length with the patient today.  Concerns regarding medicines are outlined above.  Medication changes, Labs and Tests ordered today are listed in the Patient Instructions below.  There are no Patient Instructions on file for this visit.   Signed, Wilbert Bihari, MD  06/25/2024 1:41 PM    [facility] [address] [place], [place], [place]  [address] Phone: [phone]; Fax: [phone]

## 2024-06-25 NOTE — Progress Notes (Unsigned)
 Enrolled for [employer] to mail a ZIO XT long term holter monitor to the patients address on file.   Requested monitor to be mailed 09/25/2024.

## 2024-06-25 NOTE — Addendum Note (Signed)
 Addended by: Casmere Hollenbeck N on: 06/25/2024 01:59 PM   Modules accepted: Orders

## 2024-06-25 NOTE — Patient Instructions (Signed)
 Medication Instructions:  Stop Amiodarone  *If you need a refill on your cardiac medications before your next appointment, please call your pharmacy*  Lab Work: NONE If you have labs (blood work) drawn today and your tests are completely normal, you will receive your results only by: MyChart Message (if you have MyChart) OR A paper copy in the mail If you have any lab test that is abnormal or we need to change your treatment, we will call you to review the results.  Testing/Procedures: Echocardiogram Your physician has requested that you have an echocardiogram. Echocardiography is a painless test that uses sound waves to create images of your heart. It provides your doctor with information about the size and shape of your heart and how well your heart's chambers and valves are working. This procedure takes approximately one hour. There are no restrictions for this procedure. Please do NOT wear cologne, perfume, aftershave, or lotions (deodorant is allowed). Please arrive 15 minutes prior to your appointment time.  Please note: We ask at that you not bring children with you during ultrasound (echo/ vascular) testing. Due to room size and safety concerns, children are not allowed in the ultrasound rooms during exams. Our front office staff cannot provide observation of children in our lobby area while testing is being conducted. An adult accompanying a patient to their appointment will only be allowed in the ultrasound room at the discretion of the ultrasound technician under special circumstances. We apologize for any inconvenience.  3 Day Zio Heart Monitor in 3 months Your physician has requested that you wear a Zio heart monitor for ___3__ days. This will be mailed to your home with instructions on how to apply the monitor and how to return it when finished. Please allow 2 weeks after returning the heart monitor before our office calls you with the results.   Follow-Up: At [facility], you and your health needs are our priority.  As part of our continuing mission to provide you with exceptional heart care, our providers are all part of one team.  This team includes your primary Cardiologist (physician) and Advanced Practice Providers or APPs (Physician Assistants and Nurse Practitioners) who all work together to provide you with the care you need, when you need it.  Your next appointment:   6 month(s)  Provider:   Shlomo, MD  We recommend signing up for the patient portal called MyChart.  Sign up information is provided on this After Visit Summary.  MyChart is used to connect with patients for Virtual Visits (Telemedicine).  Patients are able to view lab/test results, encounter notes, upcoming appointments, etc.  Non-urgent messages can be sent to your provider as well.   To learn more about what you can do with MyChart, go to [email or web address].   Other Instructions ZIO XT- Long Term Monitor Instructions  Your physician has requested you wear a ZIO patch monitor for 3 days.  This is a single patch monitor. [employer] supplies one patch monitor per enrollment. Additional stickers are not available. Please do not apply patch if you will be having a Nuclear Stress Test,  Echocardiogram, Cardiac CT, MRI, or Chest Xray during the period you would be wearing the  monitor. The patch cannot be worn during these tests. You cannot remove and re-apply the  ZIO XT patch monitor.  Your ZIO patch monitor will be mailed 3 day USPS to your address on file. It may take 3-5 days  to receive your monitor after you have been enrolled.  Once you have received your monitor, please review the enclosed instructions. Your monitor  has already been registered assigning a specific monitor serial # to you.  Billing and Patient Assistance Program Information  We have supplied [employer] with any of your insurance information on file for billing purposes. [employer] offers a sliding scale  Patient Assistance Program for patients that do not have  insurance, or whose insurance does not completely cover the cost of the ZIO monitor.  You must apply for the Patient Assistance Program to qualify for this discounted rate.  To apply, please call [employer] at [phone], select option 4, select option 2, ask to apply for  Patient Assistance Program. Meredeth will ask your household income, and how many people  are in your household. They will quote your out-of-pocket cost based on that information.  [employer] will also be able to set up a 38-month, interest-free payment plan if needed.  Applying the monitor   Shave hair from upper left chest.  Hold abrader disc by orange tab. Rub abrader in 40 strokes over the upper left chest as  indicated in your monitor instructions.  Clean area with 4 enclosed alcohol pads. Let dry.  Apply patch as indicated in monitor instructions. Patch will be placed under collarbone on left  side of chest with arrow pointing upward.  Rub patch adhesive wings for 2 minutes. Remove white label marked 1. Remove the white  label marked 2. Rub patch adhesive wings for 2 additional minutes.  While looking in a mirror, press and release button in center of patch. A small green light will  flash 3-4 times. This will be your only indicator that the monitor has been turned on.  Do not shower for the first 24 hours. You may shower after the first 24 hours.  Press the button if you feel a symptom. You will hear a small click. Record Date, Time and  Symptom in the Patient Logbook.  When you are ready to remove the patch, follow instructions on the last 2 pages of Patient  Logbook. Stick patch monitor onto the last page of Patient Logbook.  Place Patient Logbook in the blue and white box. Use locking tab on box and tape box closed  securely. The blue and white box has prepaid postage on it. Please place it in the mailbox as  soon as possible. Your physician should have  your test results approximately 7 days after the  monitor has been mailed back to [facility].  Call [facility] Customer Care at [phone] if you have questions regarding  your ZIO XT patch monitor. Call them immediately if you see an orange light blinking on your  monitor.  If your monitor falls off in less than 4 days, contact our Monitor department at [phone].  If your monitor becomes loose or falls off after 4 days call [employer] at [phone] for  suggestions on securing your monitor

## 2024-06-26 ENCOUNTER — Encounter (HOSPITAL_COMMUNITY)
Admission: RE | Admit: 2024-06-26 | Discharge: 2024-06-26 | Disposition: A | Discharge status: Home / Self Care | Source: Ambulatory Visit | Attending: Cardiology | Admitting: Cardiology

## 2024-06-26 DIAGNOSIS — I213 ST elevation (STEMI) myocardial infarction of unspecified site: Secondary | ICD-10-CM

## 2024-06-26 DIAGNOSIS — Z48812 Encounter for surgical aftercare following surgery on the circulatory system: Secondary | ICD-10-CM | POA: Diagnosis not present

## 2024-06-26 DIAGNOSIS — Z951 Presence of aortocoronary bypass graft: Secondary | ICD-10-CM

## 2024-06-28 ENCOUNTER — Encounter (HOSPITAL_COMMUNITY)
Admission: RE | Admit: 2024-06-28 | Discharge: 2024-06-28 | Disposition: A | Discharge status: Home / Self Care | Source: Ambulatory Visit | Attending: Cardiology | Admitting: Cardiology

## 2024-06-28 DIAGNOSIS — I2583 Coronary atherosclerosis due to lipid rich plaque: Secondary | ICD-10-CM | POA: Diagnosis not present

## 2024-06-28 DIAGNOSIS — Z48812 Encounter for surgical aftercare following surgery on the circulatory system: Secondary | ICD-10-CM | POA: Insufficient documentation

## 2024-06-28 DIAGNOSIS — I2510 Atherosclerotic heart disease of native coronary artery without angina pectoris: Secondary | ICD-10-CM | POA: Insufficient documentation

## 2024-06-28 DIAGNOSIS — I213 ST elevation (STEMI) myocardial infarction of unspecified site: Secondary | ICD-10-CM

## 2024-06-28 DIAGNOSIS — I4891 Unspecified atrial fibrillation: Secondary | ICD-10-CM | POA: Diagnosis not present

## 2024-06-28 DIAGNOSIS — I252 Old myocardial infarction: Secondary | ICD-10-CM | POA: Diagnosis not present

## 2024-06-28 DIAGNOSIS — Z951 Presence of aortocoronary bypass graft: Secondary | ICD-10-CM | POA: Insufficient documentation

## 2024-06-29 ENCOUNTER — Encounter: Payer: Self-pay | Admitting: Cardiology

## 2024-06-30 ENCOUNTER — Other Ambulatory Visit: Payer: Self-pay | Admitting: Family Medicine

## 2024-07-01 ENCOUNTER — Encounter (HOSPITAL_COMMUNITY)
Admission: RE | Admit: 2024-07-01 | Discharge: 2024-07-01 | Disposition: A | Discharge status: Home / Self Care | Source: Ambulatory Visit | Attending: Cardiology

## 2024-07-01 DIAGNOSIS — I213 ST elevation (STEMI) myocardial infarction of unspecified site: Secondary | ICD-10-CM

## 2024-07-01 DIAGNOSIS — I4891 Unspecified atrial fibrillation: Secondary | ICD-10-CM

## 2024-07-01 DIAGNOSIS — Z48812 Encounter for surgical aftercare following surgery on the circulatory system: Secondary | ICD-10-CM | POA: Diagnosis not present

## 2024-07-01 DIAGNOSIS — Z951 Presence of aortocoronary bypass graft: Secondary | ICD-10-CM

## 2024-07-03 ENCOUNTER — Encounter (HOSPITAL_COMMUNITY)
Admission: RE | Admit: 2024-07-03 | Discharge: 2024-07-03 | Disposition: A | Discharge status: Home / Self Care | Source: Ambulatory Visit | Attending: Cardiology | Admitting: Cardiology

## 2024-07-03 DIAGNOSIS — Z48812 Encounter for surgical aftercare following surgery on the circulatory system: Secondary | ICD-10-CM | POA: Diagnosis not present

## 2024-07-03 DIAGNOSIS — Z951 Presence of aortocoronary bypass graft: Secondary | ICD-10-CM

## 2024-07-03 DIAGNOSIS — I213 ST elevation (STEMI) myocardial infarction of unspecified site: Secondary | ICD-10-CM

## 2024-07-04 DIAGNOSIS — H903 Sensorineural hearing loss, bilateral: Secondary | ICD-10-CM | POA: Diagnosis not present

## 2024-07-05 ENCOUNTER — Encounter (HOSPITAL_COMMUNITY)
Admission: RE | Admit: 2024-07-05 | Discharge: 2024-07-05 | Disposition: A | Discharge status: Home / Self Care | Source: Ambulatory Visit | Attending: Cardiology | Admitting: Cardiology

## 2024-07-05 DIAGNOSIS — I213 ST elevation (STEMI) myocardial infarction of unspecified site: Secondary | ICD-10-CM

## 2024-07-05 DIAGNOSIS — Z48812 Encounter for surgical aftercare following surgery on the circulatory system: Secondary | ICD-10-CM | POA: Diagnosis not present

## 2024-07-05 DIAGNOSIS — I4891 Unspecified atrial fibrillation: Secondary | ICD-10-CM

## 2024-07-05 DIAGNOSIS — Z951 Presence of aortocoronary bypass graft: Secondary | ICD-10-CM

## 2024-07-08 ENCOUNTER — Telehealth (HOSPITAL_COMMUNITY): Payer: Self-pay | Admitting: *Deleted

## 2024-07-08 ENCOUNTER — Encounter (HOSPITAL_COMMUNITY)
Admission: RE | Admit: 2024-07-08 | Discharge: 2024-07-08 | Disposition: A | Discharge status: Home / Self Care | Source: Ambulatory Visit | Attending: Cardiology

## 2024-07-08 DIAGNOSIS — Z48812 Encounter for surgical aftercare following surgery on the circulatory system: Secondary | ICD-10-CM | POA: Diagnosis not present

## 2024-07-08 DIAGNOSIS — Z951 Presence of aortocoronary bypass graft: Secondary | ICD-10-CM

## 2024-07-08 DIAGNOSIS — I213 ST elevation (STEMI) myocardial infarction of unspecified site: Secondary | ICD-10-CM

## 2024-07-08 NOTE — Progress Notes (Signed)
 Patient having frequent ectopy couplets and triplets. Exercise stopped. Blood pressure 124/70/ patient asymptomatic. Had Demontez to stop exercise and to restart at a lower workload.PVC's decreased when workload decreased.  Taking medications as prescribed. Onsite provider Katlyn West NP notified. Khale resumed exercise on the treadmill at 2.3/0 he started having increased PVC's and couplets again. Had the patient to stop PVC's were decreased when he decreased the speed to a lower workload. Per Katlyn West NP will reach out to Dr Dorine Ronelle's cardiologist for further guidance,Patient remains asymptomatic. Dr  Shlomo notified of ectopy and wants Mr Mcclane to wear a event monitor. Dr Shlomo said that Mr Fosdick may continue to exercise at cardiac rehab.Hadassah Elpidio Quan RN BSN

## 2024-07-08 NOTE — Telephone Encounter (Signed)
-----   Message from Wayne Lamb sent at 07/08/2024 12:46 PM EDT ----- yes ----- Message ----- From: Wayne Lamb ORN, RN Sent: 07/08/2024  12:24 PM EDT To: Wayne JONELLE Bihari, MD  Thanks for you input Dr Lamb are you okay with [name] continuing exercise at cardiac Rehab?  Lamb ----- Message ----- From: Lamb Wayne JONELLE, MD Sent: 07/08/2024  12:06 PM EDT To: Lamb ORN Cyrus, RN; Geni LITTIE Sar, RN; Kat#  Geni can we go ahead and get a 3 day ziopatch now instead of October ----- Message ----- From: Wayne Lamb ORN, RN Sent: 07/08/2024  12:01 PM EDT To: Wayne JONELLE Bihari, MD; Geni LITTIE Sar, RN; Katly#  Good afternoon Dr Lamb,  Patient having frequent ectopy couplets and triplets. Exercise stopped. Blood pressure 124/70/ patient asymptomatic. Had Chayce to stop exercise and to restart at a lower workload.PVC's decreased when workload decreased.  Taking medications as prescribed. Onsite provider Katlyn West NP notified. Judge resumed exercise on the treadmill at 2.3/0 he started having increased PVC's and couplets again. Had the patient to stop PVC's were decreased when he decreased the speed to a lower workload. Patient's betablocker was discontinued on 05/27/24 due sinus brady rate of 38. [name] told me to let you know that he does not have anymore metoprolol  he disposed it at the pharmacy. I appreciate your input!  Please review attached Media. Thanks you!  Sincerely, [facility]  Cardiac Rehab

## 2024-07-09 NOTE — Progress Notes (Signed)
 Cardiac Individual Treatment Plan  Patient Details  Name: Wayne Lamb MRN: [id number] Date of Birth: 15-Jun-1946 Referring Provider:   Flowsheet Row INTENSIVE CARDIAC REHAB ORIENT from 05/23/2024 in [facility]  Referring Provider Dr. Terrall (Dr. Wilbert Bihari covering)    Initial Encounter Date:  Flowsheet Row INTENSIVE CARDIAC REHAB ORIENT from 05/23/2024 in [facility] Heart, Vascular, & Lung [facility]  Date 05/23/24    Visit Diagnosis: 03/21/24 ST elevation myocardial infarction (STEMI), unspecified artery (HCC)  03/21/24 S/P CABG x 4  Patient's Home Medications on Admission:  Current Outpatient Medications:    acetaminophen  (TYLENOL ) 500 MG tablet, Take 2 tablets by mouth every 6 (six) hours as needed for mild pain (pain score 1-3). (Patient not taking: Reported on 06/25/2024), Disp: , Rfl:    ascorbic acid (VITAMIN C) 500 MG tablet, Take 500 mg by mouth daily., Disp: , Rfl:    aspirin 81 MG chewable tablet, Lamb 81 mg by mouth daily., Disp: , Rfl:    atorvastatin  (LIPITOR) 80 MG tablet, Take 1 tablet (80 mg total) by mouth at bedtime., Disp: 90 tablet, Rfl: 3   clopidogrel  (PLAVIX ) 75 MG tablet, TAKE 1 TABLET BY MOUTH EVERY DAY, Disp: 30 tablet, Rfl: 1   co-enzyme Q-10 30 MG capsule, Take 100 mg by mouth daily., Disp: , Rfl:    ezetimibe  (ZETIA ) 10 MG tablet, Take 1 tablet (10 mg total) by mouth at bedtime., Disp: 90 tablet, Rfl: 3   losartan  (COZAAR ) 25 MG tablet, Take a whole (25 mg) tab in the morning and a half (12.5 mg) a tablet with supper., Disp: , Rfl:    omeprazole  (PRILOSEC) 20 MG capsule, Take 1 capsule (20 mg total) by mouth every morning., Disp: 90 capsule, Rfl: 3   zinc gluconate 50 MG tablet, Take 50 mg by mouth daily., Disp: , Rfl:   Past Medical History: Past Medical History:  Diagnosis Date   BPH with obstruction/lower urinary tract symptoms    CAD (coronary artery disease),  native coronary artery    S/P inferolateral STEMI/Vfib arrest and Cardiac cath revealed 90% ostial to proximal LAD, 80% mid LAD, 70% D1, 99% OM1 and occluded proximal RCA.  S/P CABG with SVG to PDA, SVG to OM1, SVG to D1 and LIMA to LAD.   Cataract    Diabetes mellitus without complication (HCC)    Dx 05/2023-->fasting gluc 200, Hba1c 7.7%   Diverticulosis    2014 colonoscopy   Elevated PSA    MRI prostate was done 2016 to eval persistently rising PSAs (no biopsy had been done): this showed no sign of prostate ca.  Stable 07/2017, 01/2018, 01/2019, 12/2019, 05/2020.   Encounter for hepatitis C virus screening test for high risk patient    Done by [facility] physicians 01/18/17   Hypercholesterolemia    Leg DVT (deep venous thromboembolism), acute, left (HCC)    11/2022   Lumbar spondylosis    MRI L spine 2005: severe facet deg, SI deg   Obesity, Class II, BMI 35-39.9    OSA on CPAP    compliant   PAF (paroxysmal atrial fibrillation) (HCC)    Postop CABG 2025   Prostate nodule 2016   prostate nodule or ridge in R prostate lobe--w/u reassuring 2016    Pulmonary nodule    11/2022.  F/u imaging stable, no further imaging needed.   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/20/22  hosp   STEMI (ST elevation myocardial infarction) (HCC)    Vfib arretst 02/2024-->CABG   Tubular adenoma of colon 2014   recommend repeat colonoscopy 2019    Tobacco Use: Social History   Tobacco Use  Smoking Status Former   Current packs/day: 0.00   Average packs/day: 1 pack/day for 15.0 years (15.0 ttl pk-yrs)   Types: Cigarettes   Start date: 04/19/1965   Quit date: 04/19/1980   Years since quitting: 44.2  Smokeless Tobacco Never    Labs: Review Flowsheet  More data exists      Latest Ref Rng & Units 02/15/2019 08/23/2019 06/28/2023 01/01/2024 06/11/2024  Labs for ITP Cardiac and Pulmonary Rehab  Cholestrol 0 - 200 mg/dL 727  763  - - 881   LDL (calc) 0 - 99 mg/dL 791  847  - - 47   HDL-C >39.00 mg/dL  60.99  48.89  - - 50.79   Trlycerides 0.0 - 149.0 mg/dL 874.9  835.9  - - 893.9   Hemoglobin A1c 5.7 - 6.4 % 0.0 - 7.0 % 4.0 - 5.6 % 4.0 - 5.6 % 5.7  6.2  7.7  9.0  9.0  9.0  9.0  6.1  6.1  6.1  6.1     Details       Multiple values from one day are sorted in reverse-chronological order         Capillary Blood Glucose: Lab Results  Component Value Date   GLUCAP 131 (H) 06/12/2024   GLUCAP 106 (H) 06/05/2024   GLUCAP 126 (H) 06/03/2024   GLUCAP 176 (H) 06/03/2024   GLUCAP 131 (H) 05/29/2024     Exercise Target Goals: Exercise Program Goal: Individual exercise prescription set using results from initial 6 min walk test and THRR while considering  patient's activity barriers and safety.   Exercise Prescription Goal: Initial exercise prescription builds to 30-45 minutes a day of aerobic activity, 2-3 days per week.  Home exercise guidelines will be given to patient during program as part of exercise prescription that the participant will acknowledge.  Activity Barriers & Risk Stratification:  Activity Barriers & Cardiac Risk Stratification - 05/23/24 1423       Activity Barriers & Cardiac Risk Stratification   Activity Barriers Arthritis;Deconditioning;Balance Concerns;History of Falls    Cardiac Risk Stratification High          6 Minute Walk:  6 Minute Walk     Row Name 05/23/24 1418         6 Minute Walk   Phase Initial     Distance 1275 feet     Walk Time 6 minutes     # of Rest Breaks 0     MPH 2.41     METS 2.17     RPE 11     Perceived Dyspnea  0     VO2 Peak 7.58     Symptoms Yes (comment)     Comments Patient remained asymptomatic, but had PVC's during walk test only, and irregular bradycardia throughout stay.     Resting HR 45 bpm     Resting BP 104/68     Resting Oxygen Saturation  97 %     Exercise Oxygen Saturation  during 6 min walk 100 %     Max Ex. HR 81 bpm     Max Ex. BP 124/76     2 Minute Post BP 118/72        Oxygen Initial  Assessment:   Oxygen  Re-Evaluation:   Oxygen Discharge (Final Oxygen Re-Evaluation):   Initial Exercise Prescription:  Initial Exercise Prescription - 05/23/24 1400       Date of Initial Exercise RX and Referring Provider   Date 05/23/24    Referring Provider Dr. Terrall (Dr. Wilbert Bihari covering)    Expected Discharge Date 08/14/24      Treadmill   MPH 1.8    Grade 0    Minutes 15    METs 2.38      Recumbant Bike   Level 1    RPM 50    Watts 10    Minutes 15    METs 1.9      Prescription Details   Frequency (times per week) 3    Duration Progress to 30 minutes of continuous aerobic without signs/symptoms of physical distress      Intensity   THRR 40-80% of Max Heartrate 57-114    Ratings of Perceived Exertion 11-13    Perceived Dyspnea 0-4      Progression   Progression Continue progressive overload as per policy without signs/symptoms or physical distress.      Resistance Training   Training Prescription Yes    Weight 3    Reps 10-15          Perform Capillary Blood Glucose checks as needed.  Exercise Prescription Changes:   Exercise Prescription Changes     Row Name 05/29/24 1031 06/10/24 1028 07/01/24 1034         Response to Exercise   Blood Pressure (Admit) 138/78 136/60 122/60     Blood Pressure (Exercise) 138/74 164/70 --     Blood Pressure (Exit) 104/60 112/70 114/70     Heart Rate (Admit) 58 bpm 49 bpm 49 bpm     Heart Rate (Exercise) 108 bpm 109 bpm 120 bpm     Heart Rate (Exit) 70 bpm 73 bpm 83 bpm     Rating of Perceived Exertion (Exercise) 8 10 9      Symptoms None None None     Comments Off to a good start with exercise. -- --     Duration Continue with 30 min of aerobic exercise without signs/symptoms of physical distress. Continue with 30 min of aerobic exercise without signs/symptoms of physical distress. Continue with 30 min of aerobic exercise without signs/symptoms of physical distress.     Intensity THRR unchanged THRR  unchanged THRR unchanged       Progression   Progression Continue to progress workloads to maintain intensity without signs/symptoms of physical distress. Continue to progress workloads to maintain intensity without signs/symptoms of physical distress. Continue to progress workloads to maintain intensity without signs/symptoms of physical distress.     Average METs 2.3 3.1 3.6       Resistance Training   Training Prescription No Yes Yes     Weight Relaxation day, no weights. 3 lbs 3 lbs     Reps -- 10-15 10-15     Time -- 5 Minutes 5 Minutes       Interval Training   Interval Training No No No       Treadmill   MPH 2 2.5 2.3     Grade 0 0 0     Minutes 15 15 15      METs 2.53 2.91 2.76       Recumbant Bike   Level 1 3 3      RPM 88 78 72     Watts 23 47 71  Minutes 15 15 15      METs 2.1 3.3 4.4       Home Exercise Plan   Plans to continue exercise at -- -- [employer] (comment)  Walking, equipment at gym     Frequency -- -- Add 2 additional days to program exercise sessions.     Initial Home Exercises Provided -- -- 06/17/24        Exercise Comments:   Exercise Comments     Row Name 05/29/24 1134 06/17/24 1107 07/03/24 1052       Exercise Comments Jawuan tolerated low intensity exercise well without symptoms. Oriented him to the exercise equipment and stretching routine. Reviewed exercise action plan, goals, and target heart rate range with Norleen. Reviewed METs and goals with Norleen.        Exercise Goals and Review:   Exercise Goals     Row Name 05/23/24 1427             Exercise Goals   Increase Physical Activity Yes       Intervention Provide advice, education, support and counseling about physical activity/exercise needs.;Develop an individualized exercise prescription for aerobic and resistive training based on initial evaluation findings, risk stratification, comorbidities and participant's personal goals.       Expected Outcomes Short Term: Attend  rehab on a regular basis to increase amount of physical activity.;Long Term: Exercising regularly at least 3-5 days a week.;Long Term: Add in home exercise to make exercise part of routine and to increase amount of physical activity.       Increase Strength and Stamina Yes       Intervention Provide advice, education, support and counseling about physical activity/exercise needs.;Develop an individualized exercise prescription for aerobic and resistive training based on initial evaluation findings, risk stratification, comorbidities and participant's personal goals.       Expected Outcomes Short Term: Increase workloads from initial exercise prescription for resistance, speed, and METs.;Short Term: Perform resistance training exercises routinely during rehab and add in resistance training at home;Long Term: Improve cardiorespiratory fitness, muscular endurance and strength as measured by increased METs and functional capacity ( )       Able to understand and use rate of perceived exertion (RPE) scale Yes       Intervention Provide education and explanation on how to use RPE scale       Expected Outcomes Short Term: Able to use RPE daily in rehab to express subjective intensity level;Long Term:  Able to use RPE to guide intensity level when exercising independently       Knowledge and understanding of Target Heart Rate Range (THRR) Yes       Intervention Provide education and explanation of THRR including how the numbers were predicted and where they are located for reference       Expected Outcomes Short Term: Able to state/look up THRR;Long Term: Able to use THRR to govern intensity when exercising independently;Short Term: Able to use daily as guideline for intensity in rehab       Understanding of Exercise Prescription Yes       Intervention Provide education, explanation, and written materials on patient's individual exercise prescription       Expected Outcomes Short Term: Able to explain program  exercise prescription;Long Term: Able to explain home exercise prescription to exercise independently          Exercise Goals Re-Evaluation :  Exercise Goals Re-Evaluation     Row Name 05/29/24 1134 06/17/24 1107 07/03/24 1052  Exercise Goal Re-Evaluation   Exercise Goals Review Increase Physical Activity;Increase Strength and Stamina;Able to understand and use rate of perceived exertion (RPE) scale Increase Physical Activity;Increase Strength and Stamina;Able to understand and use rate of perceived exertion (RPE) scale;Understanding of Exercise Prescription;Knowledge and understanding of Target Heart Rate Range (THRR);Able to check pulse independently Increase Physical Activity;Increase Strength and Stamina;Able to understand and use rate of perceived exertion (RPE) scale;Understanding of Exercise Prescription;Knowledge and understanding of Target Heart Rate Range (THRR);Able to check pulse independently     Comments Zakari was able to understand and use RPE scale appropriately. Reviewed exercise prescription with Norleen. He will walk at the gym and use the equipment there. He has a smart watch to monitor his pulse at home. Harsh continues to make good progress with exercise. He will increase hand weights from 4 to 5 lbs next session.     Expected Outcomes Progress workloads as tolerated to help improve cardiorespiratory fitness. Progress workloads within THRR. Walk or exercise on the equipment at the gym at least 60 minutes to achieve 150 minutes of aerobic exercise/week. Continue to progress workloads to help increase strength and stamina.        Discharge Exercise Prescription (Final Exercise Prescription Changes):  Exercise Prescription Changes - 07/01/24 1034       Response to Exercise   Blood Pressure (Admit) 122/60    Blood Pressure (Exit) 114/70    Heart Rate (Admit) 49 bpm    Heart Rate (Exercise) 120 bpm    Heart Rate (Exit) 83 bpm    Rating of Perceived Exertion (Exercise) 9     Symptoms None    Duration Continue with 30 min of aerobic exercise without signs/symptoms of physical distress.    Intensity THRR unchanged      Progression   Progression Continue to progress workloads to maintain intensity without signs/symptoms of physical distress.    Average METs 3.6      Resistance Training   Training Prescription Yes    Weight 3 lbs    Reps 10-15    Time 5 Minutes      Interval Training   Interval Training No      Treadmill   MPH 2.3    Grade 0    Minutes 15    METs 2.76      Recumbant Bike   Level 3    RPM 72    Watts 71    Minutes 15    METs 4.4      Home Exercise Plan   Plans to continue exercise at [employer] (comment)   Walking, equipment at gym   Frequency Add 2 additional days to program exercise sessions.    Initial Home Exercises Provided 06/17/24          Nutrition:  Target Goals: Understanding of nutrition guidelines, daily intake of sodium 1500mg , cholesterol 200mg , calories 30% from fat and 7% or less from saturated fats, daily to have 5 or more servings of fruits and vegetables.  Biometrics:  Pre Biometrics - 05/23/24 1428       Pre Biometrics   Height 5' 8 (1.727 m)    Weight 87 kg    Waist Circumference 39.75 inches    Hip Circumference 39 inches    Waist to Hip Ratio 1.02 %    BMI (Calculated) 29.17    Triceps Skinfold 11 mm    % Body Fat 27 %    Grip Strength 23 kg    Flexibility --  Unable to reach   Single Leg Stand 2 seconds           Nutrition Therapy Plan and Nutrition Goals:  Nutrition Therapy & Goals - 06/28/24 1134       Nutrition Therapy   Diet Heart Healthy Diet    Drug/Food Interactions Statins/Certain Fruits      Personal Nutrition Goals   Nutrition Goal Patient to identify strategies for reducing cardiovascular risk by attending the [employer] education and nutrition series weekly.   goal in action.   Personal Goal #2 Patient to improve diet quality by using the plate method  as a guide for meal planning to include lean protein/plant protein, fruits, vegetables, whole grains, nonfat dairy as part of a well-balanced diet.   goal in action.   Personal Goal #3 Patient to identify strategies for weight loss of 0.5-2.0# per week.   goal not met.   Comments Goals in action. Raiquan has medical history of CABGx4, STEMI, Afib, DM2, TIA symptoms. LDL has improved to goal. J8r has improved to a prediabetic range. He has maintained his weight since starting with our program. Patient will benefit from participation in intensive cardiac rehab for nutrition, exercise, and lifestyle modification.      Intervention Plan   Intervention Prescribe, educate and counsel regarding individualized specific dietary modifications aiming towards targeted core components such as weight, hypertension, lipid management, diabetes, heart failure and other comorbidities.;Nutrition handout(s) given to patient.    Expected Outcomes Short Term Goal: Understand basic principles of dietary content, such as calories, fat, sodium, cholesterol and nutrients.;Long Term Goal: Adherence to prescribed nutrition plan.          Nutrition Assessments:  Nutrition Assessments - 05/23/24 1449       Rate Your Plate Scores   Pre Score 62         MEDIFICTS Score Key: >=70 Need to make dietary changes  40-70 Heart Healthy Diet <= 40 Therapeutic Level Cholesterol Diet   Flowsheet Row INTENSIVE CARDIAC REHAB ORIENT from 05/23/2024 in [facility], [facility]  Picture Your Plate Total Score on Admission 62   Picture Your Plate Scores: <59 Unhealthy dietary pattern with much room for improvement. 41-50 Dietary pattern unlikely to meet recommendations for good health and room for improvement. 51-60 More healthful dietary pattern, with some room for improvement.  >60 Healthy dietary pattern, although there may be some specific behaviors that could be improved.     Nutrition Goals Re-Evaluation:  Nutrition Goals Re-Evaluation     Row Name 05/29/24 1053 06/28/24 1134           Goals   Current Weight 191 lb 12.8 oz (87 kg) 194 lb 0.1 oz (88 kg)      Comment cholester 253, LDL 161, HDL 44, A1c 8.1 LDL 47, triglycerides 106, HDL 49, A1c 6.1      Expected Outcome Riku has medical history of CABGx4, STEMI, Afib, DM2, TIA symptoms. LDL is not at goal. A1c is not at goal. Patient will benefit from participation in intensive cardiac rehab for nutrition, exercise, and lifestyle modification. Goals in action. Ashrith has medical history of CABGx4, STEMI, Afib, DM2, TIA symptoms. LDL has improved to goal. J8r has improved to a prediabetic range. He has maintained his weight since starting with our program. Patient will benefit from participation in intensive cardiac rehab for nutrition, exercise, and lifestyle modification.         Nutrition Goals Re-Evaluation:  Nutrition  Goals Re-Evaluation     Row Name 05/29/24 1053 06/28/24 1134           Goals   Current Weight 191 lb 12.8 oz (87 kg) 194 lb 0.1 oz (88 kg)      Comment cholester 253, LDL 161, HDL 44, A1c 8.1 LDL 47, triglycerides 106, HDL 49, A1c 6.1      Expected Outcome Pellegrino has medical history of CABGx4, STEMI, Afib, DM2, TIA symptoms. LDL is not at goal. A1c is not at goal. Patient will benefit from participation in intensive cardiac rehab for nutrition, exercise, and lifestyle modification. Goals in action. Aahil has medical history of CABGx4, STEMI, Afib, DM2, TIA symptoms. LDL has improved to goal. J8r has improved to a prediabetic range. He has maintained his weight since starting with our program. Patient will benefit from participation in intensive cardiac rehab for nutrition, exercise, and lifestyle modification.         Nutrition Goals Discharge (Final Nutrition Goals Re-Evaluation):  Nutrition Goals Re-Evaluation - 06/28/24 1134       Goals   Current Weight 194 lb 0.1 oz (88 kg)     Comment LDL 47, triglycerides 106, HDL 49, A1c 6.1    Expected Outcome Goals in action. Camdin has medical history of CABGx4, STEMI, Afib, DM2, TIA symptoms. LDL has improved to goal. J8r has improved to a prediabetic range. He has maintained his weight since starting with our program. Patient will benefit from participation in intensive cardiac rehab for nutrition, exercise, and lifestyle modification.          Psychosocial: Target Goals: Acknowledge presence or absence of significant depression and/or stress, maximize coping skills, provide positive support system. Participant is able to verbalize types and ability to use techniques and skills needed for reducing stress and depression.  Initial Review & Psychosocial Screening:  Initial Psych Review & Screening - 05/23/24 1412       Initial Review   Current issues with None Identified;Current Sleep Concerns   Some mild sleep concerns, but says it in normal for him     Family Dynamics   Good Support System? Yes   He has good support from his wife, church family and friends.     Barriers   Psychosocial barriers to participate in program There are no identifiable barriers or psychosocial needs.;The patient should benefit from training in stress management and relaxation.      Screening Interventions   Interventions Encouraged to exercise;Provide feedback about the scores to participant    Expected Outcomes Long Term Goal: Stressors or current issues are controlled or eliminated.;Short Term goal: Identification and review with participant of any Quality of Life or Depression concerns found by scoring the questionnaire.;Long Term goal: The participant improves quality of Life and PHQ9 Scores as seen by post scores and/or verbalization of changes   He has no additional needs at this time         Quality of Life Scores:  Quality of Life - 05/23/24 1446       Quality of Life   Select Quality of Life      Quality of Life Scores    Health/Function Pre 27.37 %    Socioeconomic Pre 30 %    Psych/Spiritual Pre 29.14 %    Family Pre 28.5 %    GLOBAL Pre 28.44 %         Scores of 19 and below usually indicate a poorer quality of life in these areas.  A difference  of  2-3 points is a clinically meaningful difference.  A difference of 2-3 points in the total score of the Quality of Life Index has been associated with significant improvement in overall quality of life, self-image, physical symptoms, and general health in studies assessing change in quality of life.  PHQ-9: Review Flowsheet  More data exists      06/11/2024 05/23/2024 01/31/2024 07/04/2023 06/28/2023  Depression screen PHQ 2/9  Decreased Interest 0 0 0 0 0  Down, Depressed, Hopeless 0 0 0 0 0  PHQ - 2 Score 0 0 0 0 0  Altered sleeping 1 1 0 - 0  Tired, decreased energy 0 1 0 - 1  Change in appetite 0 0 0 - 0  Feeling bad or failure about yourself  0 0 0 - 0  Trouble concentrating 0 0 0 - 0  Moving slowly or fidgety/restless 0 0 0 - 0  Suicidal thoughts 0 0 0 - 0  PHQ-9 Score 1 2 0 - 1  Difficult doing work/chores Not difficult at all Not difficult at all Not difficult at all - Not difficult at all   Interpretation of Total Score  Total Score Depression Severity:  1-4 = Minimal depression, 5-9 = Mild depression, 10-14 = Moderate depression, 15-19 = Moderately severe depression, 20-27 = Severe depression   Psychosocial Evaluation and Intervention:   Psychosocial Re-Evaluation:  Psychosocial Re-Evaluation     Row Name 05/29/24 1652 06/04/24 1404 07/09/24 1548         Psychosocial Re-Evaluation   Current issues with None Identified None Identified None Identified     Comments Swan did not voice any increased concerns or stressors on his first day of exercise Nell did not voice any increased concerns or stressors  during  exercise at cardiac rehab. Will review PHQ9 in the upcoming week Jahmel says he gets up a lot during the night due to having to void  frequently. Arius has not voiced any increased concerns or stressors.     Continue Psychosocial Services  No Follow up required No Follow up required No Follow up required        Psychosocial Discharge (Final Psychosocial Re-Evaluation):  Psychosocial Re-Evaluation - 07/09/24 1548       Psychosocial Re-Evaluation   Current issues with None Identified    Comments Javyn says he gets up a lot during the night due to having to void frequently. Cisco has not voiced any increased concerns or stressors.    Continue Psychosocial Services  No Follow up required          Vocational Rehabilitation: Provide vocational rehab assistance to qualifying candidates.   Vocational Rehab Evaluation & Intervention:  Vocational Rehab - 05/23/24 1415       Initial Vocational Rehab Evaluation & Intervention   Assessment shows need for Vocational Rehabilitation No   Patient is retired. No needs         Education: Education Goals: Education classes will be provided on a weekly basis, covering required topics. Participant will state understanding/return demonstration of topics presented.    Education     Row Name 05/29/24 1500     Education   Cardiac Education Topics Pritikin   Customer service manager   Weekly Topic International Cuisine- Spotlight on the Blue Zones   Instruction Review Code 1- Verbalizes Understanding   Class Start Time 1145   Class Stop Time 1225   Class Time Calculation (min)  40 min    Row Name 06/03/24 1300     Education   Cardiac Education Topics Pritikin   Geographical information systems officer Psychosocial   Psychosocial Workshop Healthy Sleep for a Healthy Heart   Instruction Review Code 1- Verbalizes Understanding   Class Start Time 1150   Class Stop Time 1230   Class Time Calculation (min) 40 min    Row Name 06/05/24 1100     Education   Cardiac Education Topics Pritikin   Research scientist (life sciences)   Weekly Topic Simple Sides and Sauces   Instruction Review Code 1- Verbalizes Understanding   Class Start Time 1145   Class Stop Time 1225   Class Time Calculation (min) 40 min    Row Name 06/07/24 1200     Education   Cardiac Education Topics Pritikin   Nurse, children's Exercise Physiologist   Select Psychosocial   Psychosocial How Our Thoughts Can Heal Our Hearts   Instruction Review Code 1- Verbalizes Understanding   Class Start Time 1153   Class Stop Time 1241   Class Time Calculation (min) 48 min    Row Name 06/10/24 1200     Education   Cardiac Education Topics Pritikin   Hospital doctor Education   General Education Hypertension and Heart Disease   Instruction Review Code 1- Verbalizes Understanding   Class Start Time 1150   Class Stop Time 1225   Class Time Calculation (min) 35 min    Row Name 06/12/24 1100     Education   Cardiac Education Topics Pritikin   Customer service manager   Weekly Topic Powerhouse Plant-Based Proteins   Instruction Review Code 1- Verbalizes Understanding   Class Start Time 1145   Class Stop Time 1225   Class Time Calculation (min) 40 min    Row Name 06/14/24 1300     Education   Cardiac Education Topics Pritikin   Select Workshops     Workshops   Educator Exercise Physiologist   Select Exercise   Exercise Workshop Managing Heart Disease: Your Path to a Healthier Heart   Instruction Review Code 1- Verbalizes Understanding   Class Start Time 1148   Class Stop Time 1239   Class Time Calculation (min) 51 min    Row Name 06/17/24 1300     Education   Cardiac Education Topics Pritikin   Geographical information systems officer Psychosocial   Psychosocial Workshop From Head to Heart: The  Power of a Healthy Outlook   Instruction Review Code 1- Verbalizes Understanding   Class Start Time 1152   Class Stop Time 1234   Class Time Calculation (min) 42 min    Row Name 06/19/24 1100     Education   Cardiac Education Topics Pritikin   Orthoptist   Educator Dietitian   Weekly Topic Tasty Appetizers and Snacks   Instruction Review Code 1- Verbalizes Understanding   Class Start Time 1145   Class Stop Time 1225   Class Time Calculation (min) 40 min    Row Name 06/21/24 1100  Education   Cardiac Education Topics Pritikin   Hospital doctor Education   General Education Heart Disease Risk Reduction   Instruction Review Code 1- Verbalizes Understanding   Class Start Time 1150   Class Stop Time 1240   Class Time Calculation (min) 50 min    Row Name 06/24/24 1300     Education   Cardiac Education Topics Pritikin   Nurse, children's Exercise Physiologist   Select Psychosocial   Psychosocial Healthy Minds, Bodies, Hearts   Instruction Review Code 1- Verbalizes Understanding   Class Start Time 1157   Class Stop Time 1233   Class Time Calculation (min) 36 min    Row Name 06/26/24 1600     Education   Cardiac Education Topics Pritikin   Customer service manager   Weekly Topic Adding Flavor - Sodium-Free   Instruction Review Code 1- Verbalizes Understanding   Class Start Time 1140   Class Stop Time 1215   Class Time Calculation (min) 35 min    Row Name 06/28/24 1100     Education   Cardiac Education Topics Pritikin   Geographical information systems officer Exercise   Exercise Workshop Location manager and Fall Prevention   Instruction Review Code 1- Verbalizes Understanding   Class Start Time 1150   Class Stop Time 1240   Class Time Calculation (min)  50 min    Row Name 07/01/24 1500     Education   Cardiac Education Topics Pritikin   Glass blower/designer Nutrition   Nutrition Workshop Label Reading   Instruction Review Code 1- Tax inspector   Class Start Time 1145   Class Stop Time 1230   Class Time Calculation (min) 45 min    Row Name 07/03/24 1000     Education   Cardiac Education Topics Pritikin   Customer service manager   Weekly Topic Fast and Healthy Breakfasts   Instruction Review Code 1- Verbalizes Understanding   Class Start Time 1145   Class Stop Time 1220   Class Time Calculation (min) 35 min    Row Name 07/05/24 1000     Education   Cardiac Education Topics Pritikin   Licensed conveyancer Nutrition   Nutrition Other   Instruction Review Code 1- Verbalizes Understanding   Class Start Time 1145   Class Stop Time 1225   Class Time Calculation (min) 40 min    Row Name 07/08/24 1100     Education   Cardiac Education Topics Pritikin   Hospital doctor Education   General Education Metabolic Syndrome and Belly Fat   Instruction Review Code 1- Verbalizes Understanding   Class Start Time 1200   Class Stop Time 1235   Class Time Calculation (min) 35 min      Core Videos: Exercise    Move It!  Clinical staff conducted group or individual video education with verbal and written material and guidebook.  Patient learns the recommended Pritikin exercise program.  Exercise with the goal of living a long, healthy life. Some of the health benefits of exercise include controlled diabetes, healthier blood pressure levels, improved cholesterol levels, improved heart and lung capacity, improved sleep, and better body composition. Everyone should speak with their doctor before starting or changing an exercise  routine.  Biomechanical Limitations Clinical staff conducted group or individual video education with verbal and written material and guidebook.  Patient learns how biomechanical limitations can impact exercise and how we can mitigate and possibly overcome limitations to have an impactful and balanced exercise routine.  Body Composition Clinical staff conducted group or individual video education with verbal and written material and guidebook.  Patient learns that body composition (ratio of muscle mass to fat mass) is a key component to assessing overall fitness, rather than body weight alone. Increased fat mass, especially visceral belly fat, can put us  at increased risk for metabolic syndrome, type 2 diabetes, heart disease, and even death. It is recommended to combine diet and exercise (cardiovascular and resistance training) to improve your body composition. Seek guidance from your physician and exercise physiologist before implementing an exercise routine.  Exercise Action Plan Clinical staff conducted group or individual video education with verbal and written material and guidebook.  Patient learns the recommended strategies to achieve and enjoy long-term exercise adherence, including variety, self-motivation, self-efficacy, and positive decision making. Benefits of exercise include fitness, good health, weight management, more energy, better sleep, less stress, and overall well-being.  Medical   Heart Disease Risk Reduction Clinical staff conducted group or individual video education with verbal and written material and guidebook.  Patient learns our heart is our most vital organ as it circulates oxygen, nutrients, white blood cells, and hormones throughout the entire body, and carries waste away. Data supports a plant-based eating plan like the Pritikin Program for its effectiveness in slowing progression of and reversing heart disease. The video provides a number of recommendations to  address heart disease.   Metabolic Syndrome and Belly Fat  Clinical staff conducted group or individual video education with verbal and written material and guidebook.  Patient learns what metabolic syndrome is, how it leads to heart disease, and how one can reverse it and keep it from coming back. You have metabolic syndrome if you have 3 of the following 5 criteria: abdominal obesity, high blood pressure, high triglycerides, low HDL cholesterol, and high blood sugar.  Hypertension and Heart Disease Clinical staff conducted group or individual video education with verbal and written material and guidebook.  Patient learns that high blood pressure, or hypertension, is very common in the [place] . Hypertension is largely due to excessive salt intake, but other important risk factors include being overweight, physical inactivity, drinking too much alcohol, smoking, and not eating enough potassium from fruits and vegetables. High blood pressure is a leading risk factor for heart attack, stroke, congestive heart failure, dementia, kidney failure, and premature death. Long-term effects of excessive salt intake include stiffening of the arteries and thickening of heart muscle and organ damage. Recommendations include ways to reduce hypertension and the risk of heart disease.  Diseases of Our Time - Focusing on Diabetes Clinical staff conducted group or individual video education with verbal and written material and guidebook.  Patient learns why the best way to stop diseases of our time is prevention, through food and other lifestyle changes. Medicine (such as prescription pills and surgeries) is often only a Band-Aid on the problem, not a long-term solution. Most common diseases of our  time include obesity, type 2 diabetes, hypertension, heart disease, and cancer. The Pritikin Program is recommended and has been proven to help reduce, reverse, and/or prevent the damaging effects of metabolic  syndrome.  Nutrition   Overview of the Pritikin Eating Plan  Clinical staff conducted group or individual video education with verbal and written material and guidebook.  Patient learns about the Pritikin Eating Plan for disease risk reduction. The Pritikin Eating Plan emphasizes a wide variety of unrefined, minimally-processed carbohydrates, like fruits, vegetables, whole grains, and legumes. Go, Caution, and Stop food choices are explained. Plant-based and lean animal proteins are emphasized. Rationale provided for low sodium intake for blood pressure control, low added sugars for blood sugar stabilization, and low added fats and oils for coronary artery disease risk reduction and weight management.  Calorie Density  Clinical staff conducted group or individual video education with verbal and written material and guidebook.  Patient learns about calorie density and how it impacts the Pritikin Eating Plan. Knowing the characteristics of the food you choose will help you decide whether those foods will lead to weight gain or weight loss, and whether you want to consume more or less of them. Weight loss is usually a side effect of the Pritikin Eating Plan because of its focus on low calorie-dense foods.  Label Reading  Clinical staff conducted group or individual video education with verbal and written material and guidebook.  Patient learns about the Pritikin recommended label reading guidelines and corresponding recommendations regarding calorie density, added sugars, sodium content, and whole grains.  Dining Out - Part 1  Clinical staff conducted group or individual video education with verbal and written material and guidebook.  Patient learns that restaurant meals can be sabotaging because they can be so high in calories, fat, sodium, and/or sugar. Patient learns recommended strategies on how to positively address this and avoid unhealthy pitfalls.  Facts on Fats  Clinical staff conducted  group or individual video education with verbal and written material and guidebook.  Patient learns that lifestyle modifications can be just as effective, if not more so, as many medications for lowering your risk of heart disease. A Pritikin lifestyle can help to reduce your risk of inflammation and atherosclerosis (cholesterol build-up, or plaque, in the artery walls). Lifestyle interventions such as dietary choices and physical activity address the cause of atherosclerosis. A review of the types of fats and their impact on blood cholesterol levels, along with dietary recommendations to reduce fat intake is also included.  Nutrition Action Plan  Clinical staff conducted group or individual video education with verbal and written material and guidebook.  Patient learns how to incorporate Pritikin recommendations into their lifestyle. Recommendations include planning and keeping personal health goals in mind as an important part of their success.  Healthy Mind-Set    Healthy Minds, Bodies, Hearts  Clinical staff conducted group or individual video education with verbal and written material and guidebook.  Patient learns how to identify when they are stressed. Video will discuss the impact of that stress, as well as the many benefits of stress management. Patient will also be introduced to stress management techniques. The way we think, act, and feel has an impact on our hearts.  How Our Thoughts Can Heal Our Hearts  Clinical staff conducted group or individual video education with verbal and written material and guidebook.  Patient learns that negative thoughts can cause depression and anxiety. This can result in negative lifestyle behavior and serious health problems. Cognitive behavioral  therapy is an effective method to help control our thoughts in order to change and improve our emotional outlook.  Additional Videos:  Exercise    Improving Performance  Clinical staff conducted group or  individual video education with verbal and written material and guidebook.  Patient learns to use a non-linear approach by alternating intensity levels and lengths of time spent exercising to help burn more calories and lose more body fat. Cardiovascular exercise helps improve heart health, metabolism, hormonal balance, blood sugar control, and recovery from fatigue. Resistance training improves strength, endurance, balance, coordination, reaction time, metabolism, and muscle mass. Flexibility exercise improves circulation, posture, and balance. Seek guidance from your physician and exercise physiologist before implementing an exercise routine and learn your capabilities and proper form for all exercise.  Introduction to Yoga  Clinical staff conducted group or individual video education with verbal and written material and guidebook.  Patient learns about yoga, a discipline of the coming together of mind, breath, and body. The benefits of yoga include improved flexibility, improved range of motion, better posture and core strength, increased lung function, weight loss, and positive self-image. Yoga's heart health benefits include lowered blood pressure, healthier heart rate, decreased cholesterol and triglyceride levels, improved immune function, and reduced stress. Seek guidance from your physician and exercise physiologist before implementing an exercise routine and learn your capabilities and proper form for all exercise.  Medical   Aging: Enhancing Your Quality of Life  Clinical staff conducted group or individual video education with verbal and written material and guidebook.  Patient learns key strategies and recommendations to stay in good physical health and enhance quality of life, such as prevention strategies, having an advocate, securing a Health Care Proxy and Power of Attorney, and keeping a list of medications and system for tracking them. It also discusses how to avoid risk for bone  loss.  Biology of Weight Control  Clinical staff conducted group or individual video education with verbal and written material and guidebook.  Patient learns that weight gain occurs because we consume more calories than we burn (eating more, moving less). Even if your body weight is normal, you may have higher ratios of fat compared to muscle mass. Too much body fat puts you at increased risk for cardiovascular disease, heart attack, stroke, type 2 diabetes, and obesity-related cancers. In addition to exercise, following the Pritikin Eating Plan can help reduce your risk.  Decoding Lab Results  Clinical staff conducted group or individual video education with verbal and written material and guidebook.  Patient learns that lab test reflects one measurement whose values change over time and are influenced by many factors, including medication, stress, sleep, exercise, food, hydration, pre-existing medical conditions, and more. It is recommended to use the knowledge from this video to become more involved with your lab results and evaluate your numbers to speak with your doctor.   Diseases of Our Time - Overview  Clinical staff conducted group or individual video education with verbal and written material and guidebook.  Patient learns that according to the CDC, 50% to 70% of chronic diseases (such as obesity, type 2 diabetes, elevated lipids, hypertension, and heart disease) are avoidable through lifestyle improvements including healthier food choices, listening to satiety cues, and increased physical activity.  Sleep Disorders Clinical staff conducted group or individual video education with verbal and written material and guidebook.  Patient learns how good quality and duration of sleep are important to overall health and well-being. Patient also learns about sleep disorders  and how they impact health along with recommendations to address them, including discussing with a physician.  Nutrition   Dining Out - Part 2 Clinical staff conducted group or individual video education with verbal and written material and guidebook.  Patient learns how to plan ahead and communicate in order to maximize their dining experience in a healthy and nutritious manner. Included are recommended food choices based on the type of restaurant the patient is visiting.   Fueling a Banker conducted group or individual video education with verbal and written material and guidebook.  There is a strong connection between our food choices and our health. Diseases like obesity and type 2 diabetes are very prevalent and are in large-part due to lifestyle choices. The Pritikin Eating Plan provides plenty of food and hunger-curbing satisfaction. It is easy to follow, affordable, and helps reduce health risks.  Menu Workshop  Clinical staff conducted group or individual video education with verbal and written material and guidebook.  Patient learns that restaurant meals can sabotage health goals because they are often packed with calories, fat, sodium, and sugar. Recommendations include strategies to plan ahead and to communicate with the manager, chef, or server to help order a healthier meal.  Planning Your Eating Strategy  Clinical staff conducted group or individual video education with verbal and written material and guidebook.  Patient learns about the Pritikin Eating Plan and its benefit of reducing the risk of disease. The Pritikin Eating Plan does not focus on calories. Instead, it emphasizes high-quality, nutrient-rich foods. By knowing the characteristics of the foods, we choose, we can determine their calorie density and make informed decisions.  Targeting Your Nutrition Priorities  Clinical staff conducted group or individual video education with verbal and written material and guidebook.  Patient learns that lifestyle habits have a tremendous impact on disease risk and progression. This  video provides eating and physical activity recommendations based on your personal health goals, such as reducing LDL cholesterol, losing weight, preventing or controlling type 2 diabetes, and reducing high blood pressure.  Vitamins and Minerals  Clinical staff conducted group or individual video education with verbal and written material and guidebook.  Patient learns different ways to obtain key vitamins and minerals, including through a recommended healthy diet. It is important to discuss all supplements you take with your doctor.   Healthy Mind-Set    Smoking Cessation  Clinical staff conducted group or individual video education with verbal and written material and guidebook.  Patient learns that cigarette smoking and tobacco addiction pose a serious health risk which affects millions of people. Stopping smoking will significantly reduce the risk of heart disease, lung disease, and many forms of cancer. Recommended strategies for quitting are covered, including working with your doctor to develop a successful plan.  Culinary   Becoming a Set designer conducted group or individual video education with verbal and written material and guidebook.  Patient learns that cooking at home can be healthy, cost-effective, quick, and puts them in control. Keys to cooking healthy recipes will include looking at your recipe, assessing your equipment needs, planning ahead, making it simple, choosing cost-effective seasonal ingredients, and limiting the use of added fats, salts, and sugars.  Cooking - Breakfast and Snacks  Clinical staff conducted group or individual video education with verbal and written material and guidebook.  Patient learns how important breakfast is to satiety and nutrition through the entire day. Recommendations include key foods to eat during breakfast  to help stabilize blood sugar levels and to prevent overeating at meals later in the day. Planning ahead is also a  key component.  Cooking - Educational psychologist conducted group or individual video education with verbal and written material and guidebook.  Patient learns eating strategies to improve overall health, including an approach to cook more at home. Recommendations include thinking of animal protein as a side on your plate rather than center stage and focusing instead on lower calorie dense options like vegetables, fruits, whole grains, and plant-based proteins, such as beans. Making sauces in large quantities to freeze for later and leaving the skin on your vegetables are also recommended to maximize your experience.  Cooking - Healthy Salads and Dressing Clinical staff conducted group or individual video education with verbal and written material and guidebook.  Patient learns that vegetables, fruits, whole grains, and legumes are the foundations of the Pritikin Eating Plan. Recommendations include how to incorporate each of these in flavorful and healthy salads, and how to create homemade salad dressings. Proper handling of ingredients is also covered. Cooking - Soups and [employer] - Soups and Desserts Clinical staff conducted group or individual video education with verbal and written material and guidebook.  Patient learns that Pritikin soups and desserts make for easy, nutritious, and delicious snacks and meal components that are low in sodium, fat, sugar, and calorie density, while high in vitamins, minerals, and filling fiber. Recommendations include simple and healthy ideas for soups and desserts.   Overview     The Pritikin Solution Program Overview Clinical staff conducted group or individual video education with verbal and written material and guidebook.  Patient learns that the results of the Pritikin Program have been documented in more than 100 articles published in peer-reviewed journals, and the benefits include reducing risk factors for (and, in some cases, even  reversing) high cholesterol, high blood pressure, type 2 diabetes, obesity, and more! An overview of the three key pillars of the Pritikin Program will be covered: eating well, doing regular exercise, and having a healthy mind-set.  WORKSHOPS  Exercise: Exercise Basics: Building Your Action Plan Clinical staff led group instruction and group discussion with PowerPoint presentation and patient guidebook. To enhance the learning environment the use of posters, models and videos may be added. At the conclusion of this workshop, patients will comprehend the difference between physical activity and exercise, as well as the benefits of incorporating both, into their routine. Patients will understand the FITT (Frequency, Intensity, Time, and Type) principle and how to use it to build an exercise action plan. In addition, safety concerns and other considerations for exercise and cardiac rehab will be addressed by the presenter. The purpose of this lesson is to promote a comprehensive and effective weekly exercise routine in order to improve patients' overall level of fitness.   Managing Heart Disease: Your Path to a Healthier Heart Clinical staff led group instruction and group discussion with PowerPoint presentation and patient guidebook. To enhance the learning environment the use of posters, models and videos may be added.At the conclusion of this workshop, patients will understand the anatomy and physiology of the heart. Additionally, they will understand how Pritikin's three pillars impact the risk factors, the progression, and the management of heart disease.  The purpose of this lesson is to provide a high-level overview of the heart, heart disease, and how the Pritikin lifestyle positively impacts risk factors.  Exercise Biomechanics Clinical staff led group instruction and group  discussion with PowerPoint presentation and patient guidebook. To enhance the learning environment the use of  posters, models and videos may be added. Patients will learn how the structural parts of their bodies function and how these functions impact their daily activities, movement, and exercise. Patients will learn how to promote a neutral spine, learn how to manage pain, and identify ways to improve their physical movement in order to promote healthy living. The purpose of this lesson is to expose patients to common physical limitations that impact physical activity. Participants will learn practical ways to adapt and manage aches and pains, and to minimize their effect on regular exercise. Patients will learn how to maintain good posture while sitting, walking, and lifting.  Balance Training and Fall Prevention  Clinical staff led group instruction and group discussion with PowerPoint presentation and patient guidebook. To enhance the learning environment the use of posters, models and videos may be added. At the conclusion of this workshop, patients will understand the importance of their sensorimotor skills (vision, proprioception, and the vestibular system) in maintaining their ability to balance as they age. Patients will apply a variety of balancing exercises that are appropriate for their current level of function. Patients will understand the common causes for poor balance, possible solutions to these problems, and ways to modify their physical environment in order to minimize their fall risk. The purpose of this lesson is to teach patients about the importance of maintaining balance as they age and ways to minimize their risk of falling.  WORKSHOPS   Nutrition:  Fueling a Ship broker led group instruction and group discussion with PowerPoint presentation and patient guidebook. To enhance the learning environment the use of posters, models and videos may be added. Patients will review the foundational principles of the Pritikin Eating Plan and understand what constitutes a  serving size in each of the food groups. Patients will also learn Pritikin-friendly foods that are better choices when away from home and review make-ahead meal and snack options. Calorie density will be reviewed and applied to three nutrition priorities: weight maintenance, weight loss, and weight gain. The purpose of this lesson is to reinforce (in a group setting) the key concepts around what patients are recommended to eat and how to apply these guidelines when away from home by planning and selecting Pritikin-friendly options. Patients will understand how calorie density may be adjusted for different weight management goals.  Mindful Eating  Clinical staff led group instruction and group discussion with PowerPoint presentation and patient guidebook. To enhance the learning environment the use of posters, models and videos may be added. Patients will briefly review the concepts of the Pritikin Eating Plan and the importance of low-calorie dense foods. The concept of mindful eating will be introduced as well as the importance of paying attention to internal hunger signals. Triggers for non-hunger eating and techniques for dealing with triggers will be explored. The purpose of this lesson is to provide patients with the opportunity to review the basic principles of the Pritikin Eating Plan, discuss the value of eating mindfully and how to measure internal cues of hunger and fullness using the Hunger Scale. Patients will also discuss reasons for non-hunger eating and learn strategies to use for controlling emotional eating.  Targeting Your Nutrition Priorities Clinical staff led group instruction and group discussion with PowerPoint presentation and patient guidebook. To enhance the learning environment the use of posters, models and videos may be added. Patients will learn how to determine their genetic  susceptibility to disease by reviewing their family history. Patients will gain insight into the  importance of diet as part of an overall healthy lifestyle in mitigating the impact of genetics and other environmental insults. The purpose of this lesson is to provide patients with the opportunity to assess their personal nutrition priorities by looking at their family history, their own health history and current risk factors. Patients will also be able to discuss ways of prioritizing and modifying the Pritikin Eating Plan for their highest risk areas  Menu  Clinical staff led group instruction and group discussion with PowerPoint presentation and patient guidebook. To enhance the learning environment the use of posters, models and videos may be added. Using menus brought in from [employer], or printed from [employer], patients will apply the Pritikin dining out guidelines that were presented in the [employer] video. Patients will also be able to practice these guidelines in a variety of provided scenarios. The purpose of this lesson is to provide patients with the opportunity to practice hands-on learning of the Pritikin Dining Out guidelines with actual menus and practice scenarios.  Label Reading Clinical staff led group instruction and group discussion with PowerPoint presentation and patient guidebook. To enhance the learning environment the use of posters, models and videos may be added. Patients will review and discuss the Pritikin label reading guidelines presented in Pritikin's Label Reading Educational series video. Using fool labels brought in from local grocery stores and markets, patients will apply the label reading guidelines and determine if the packaged food meet the Pritikin guidelines. The purpose of this lesson is to provide patients with the opportunity to review, discuss, and practice hands-on learning of the Pritikin Label Reading guidelines with actual packaged food labels. Cooking School  Pritikin's [employer] are designed to teach  patients ways to prepare quick, simple, and affordable recipes at home. The importance of nutrition's role in chronic disease risk reduction is reflected in its emphasis in the overall Pritikin program. By learning how to prepare essential core Pritikin Eating Plan recipes, patients will increase control over what they eat; be able to customize the flavor of foods without the use of added salt, sugar, or fat; and improve the quality of the food they consume. By learning a set of core recipes which are easily assembled, quickly prepared, and affordable, patients are more likely to prepare more healthy foods at home. These workshops focus on convenient breakfasts, simple entres, side dishes, and desserts which can be prepared with minimal effort and are consistent with nutrition recommendations for cardiovascular risk reduction. Cooking [employer] are taught by a Armed forces logistics/support/administrative officer (RD) who has been trained by the [employer]. The chef or RD has a clear understanding of the importance of minimizing - if not completely eliminating - added fat, sugar, and sodium in recipes. Throughout the series of Cooking School Workshop sessions, patients will learn about healthy ingredients and efficient methods of cooking to build confidence in their capability to prepare    Cooking School weekly topics:  Adding Flavor- Sodium-Free  Fast and Healthy Breakfasts  Powerhouse Plant-Based Proteins  Satisfying Salads and Dressings  Simple Sides and Sauces  International Cuisine-Spotlight on the [employer] Zones  Delicious Desserts  Savory Soups  [employer] - Meals in a Astronomer Appetizers and Snacks  Comforting Weekend Breakfasts  One-Pot Wonders   Fast Evening Meals  Landscape architect Your Pritikin Plate  WORKSHOPS   Healthy  Mindset (Psychosocial):  Focused Goals, Sustainable Changes Clinical staff led group instruction and group discussion with PowerPoint  presentation and patient guidebook. To enhance the learning environment the use of posters, models and videos may be added. Patients will be able to apply effective goal setting strategies to establish at least one personal goal, and then take consistent, meaningful action toward that goal. They will learn to identify common barriers to achieving personal goals and develop strategies to overcome them. Patients will also gain an understanding of how our mind-set can impact our ability to achieve goals and the importance of cultivating a positive and growth-oriented mind-set. The purpose of this lesson is to provide patients with a deeper understanding of how to set and achieve personal goals, as well as the tools and strategies needed to overcome common obstacles which may arise along the way.  From Head to Heart: The Power of a Healthy Outlook  Clinical staff led group instruction and group discussion with PowerPoint presentation and patient guidebook. To enhance the learning environment the use of posters, models and videos may be added. Patients will be able to recognize and describe the impact of emotions and mood on physical health. They will discover the importance of self-care and explore self-care practices which may work for them. Patients will also learn how to utilize the 4 C's to cultivate a healthier outlook and better manage stress and challenges. The purpose of this lesson is to demonstrate to patients how a healthy outlook is an essential part of maintaining good health, especially as they continue their cardiac rehab journey.  Healthy Sleep for a Healthy Heart Clinical staff led group instruction and group discussion with PowerPoint presentation and patient guidebook. To enhance the learning environment the use of posters, models and videos may be added. At the conclusion of this workshop, patients will be able to demonstrate knowledge of the importance of sleep to overall health, well-being,  and quality of life. They will understand the symptoms of, and treatments for, common sleep disorders. Patients will also be able to identify daytime and nighttime behaviors which impact sleep, and they will be able to apply these tools to help manage sleep-related challenges. The purpose of this lesson is to provide patients with a general overview of sleep and outline the importance of quality sleep. Patients will learn about a few of the most common sleep disorders. Patients will also be introduced to the concept of "sleep hygiene," and discover ways to self-manage certain sleeping problems through simple daily behavior changes. Finally, the workshop will motivate patients by clarifying the links between quality sleep and their goals of heart-healthy living.   Recognizing and Reducing Stress Clinical staff led group instruction and group discussion with PowerPoint presentation and patient guidebook. To enhance the learning environment the use of posters, models and videos may be added. At the conclusion of this workshop, patients will be able to understand the types of stress reactions, differentiate between acute and chronic stress, and recognize the impact that chronic stress has on their health. They will also be able to apply different coping mechanisms, such as reframing negative self-talk. Patients will have the opportunity to practice a variety of stress management techniques, such as deep abdominal breathing, progressive muscle relaxation, and/or guided imagery.  The purpose of this lesson is to educate patients on the role of stress in their lives and to provide healthy techniques for coping with it.  Learning Barriers/Preferences:  Learning Barriers/Preferences - 05/23/24 1414  Learning Barriers/Preferences   Learning Barriers Exercise Concerns   Some balance concerns   Learning Preferences Written Material;Pictoral          Education Topics:  Knowledge Questionnaire Score:   Knowledge Questionnaire Score - 05/23/24 1437       Knowledge Questionnaire Score   Pre Score 19/24          Core Components/Risk Factors/Patient Goals at Admission:  Personal Goals and Risk Factors at Admission - 05/23/24 1415       Core Components/Risk Factors/Patient Goals on Admission    Weight Management Yes;Weight Loss    Intervention Weight Management: Develop a combined nutrition and exercise program designed to reach desired caloric intake, while maintaining appropriate intake of nutrient and fiber, sodium and fats, and appropriate energy expenditure required for the weight goal.;Weight Management: Provide education and appropriate resources to help participant work on and attain dietary goals.;Weight Management/Obesity: Establish reasonable short term and long term weight goals.    Admit Weight 191 lb 12.8 oz (87 kg)    Expected Outcomes Short Term: Continue to assess and modify interventions until short term weight is achieved;Long Term: Adherence to nutrition and physical activity/exercise program aimed toward attainment of established weight goal;Weight Loss: Understanding of general recommendations for a balanced deficit meal plan, which promotes 1-2 lb weight loss per week and includes a negative energy balance of [phone] kcal/d;Understanding recommendations for meals to include 15-35% energy as protein, 25-35% energy from fat, 35-60% energy from carbohydrates, less than 200mg  of dietary cholesterol, 20-35 gm of total fiber daily;Understanding of distribution of calorie intake throughout the day with the consumption of 4-5 meals/snacks    Diabetes Yes    Intervention Provide education about signs/symptoms and action to take for hypo/hyperglycemia.;Provide education about proper nutrition, including hydration, and aerobic/resistive exercise prescription along with prescribed medications to achieve blood glucose in normal ranges: Fasting glucose 65-99 mg/dL    Expected Outcomes  Short Term: Participant verbalizes understanding of the signs/symptoms and immediate care of hyper/hypoglycemia, proper foot care and importance of medication, aerobic/resistive exercise and nutrition plan for blood glucose control.;Long Term: Attainment of HbA1C < 7%.    Hypertension Yes    Intervention Provide education on lifestyle modifcations including regular physical activity/exercise, weight management, moderate sodium restriction and increased consumption of fresh fruit, vegetables, and low fat dairy, alcohol moderation, and smoking cessation.;Monitor prescription use compliance.    Expected Outcomes Short Term: Continued assessment and intervention until BP is < 140/102mm HG in hypertensive participants. < 130/81mm HG in hypertensive participants with diabetes, heart failure or chronic kidney disease.;Long Term: Maintenance of blood pressure at goal levels.    Lipids Yes    Intervention Provide education and support for participant on nutrition & aerobic/resistive exercise along with prescribed medications to achieve LDL 70mg , HDL >40mg .    Expected Outcomes Short Term: Participant states understanding of desired cholesterol values and is compliant with medications prescribed. Participant is following exercise prescription and nutrition guidelines.;Long Term: Cholesterol controlled with medications as prescribed, with individualized exercise RX and with personalized nutrition plan. Value goals: LDL < 70mg , HDL > 40 mg.    Personal Goal Other Yes    Personal Goal Short: Heart healthy diet Long term: Play pickleball, go back to kettlebell training weight loss    Intervention will continue to monitor pt and progress workloads as tolerated without sign or symptom    Expected Outcomes Pt will achieve his goals          Core Components/Risk Factors/Patient  Goals Review:   Goals and Risk Factor Review     Row Name 05/29/24 1653 06/04/24 1406 07/09/24 1552         Core Components/Risk  Factors/Patient Goals Review   Personal Goals Review Weight Management/Obesity;Hypertension;Lipids;Diabetes Weight Management/Obesity;Hypertension;Lipids;Diabetes Weight Management/Obesity;Hypertension;Lipids;Diabetes     Review Lamone started cardiac rehab on 05/29/24. Vital signs  and CBG's were stable. heart rate improved as metoprolol  was discontinued. patient continues to wear a zio patch Edson started cardiac rehab on 05/29/24. Alexandria is off to a good start to exericise.  Vital signs  and CBG's were stable. heart rate improved as metoprolol  was discontinued. Kasen continues to wear a Zio patch monitor which he will turn in soon. Eriq is doing well with exericise at cardiac rehab.  Vital signs have been stable. Amber had frequent ectopy on 07/08/24 which decreased when exercise was stopped and workload was decreased. Patient's new cardiologist. Dr Shlomo notified. Okay to proceed with exercise.     Expected Outcomes Stepehn will continue to participate in cardiac rehab for exercise, nutrition and lifestyle modifications Danyel will continue to participate in cardiac rehab for exercise, nutrition and lifestyle modifications Musab will continue to participate in cardiac rehab for exercise, nutrition and lifestyle modifications        Core Components/Risk Factors/Patient Goals at Discharge (Final Review):   Goals and Risk Factor Review - 07/09/24 1552       Core Components/Risk Factors/Patient Goals Review   Personal Goals Review Weight Management/Obesity;Hypertension;Lipids;Diabetes    Review Graison is doing well with exericise at cardiac rehab.  Vital signs have been stable. Papa had frequent ectopy on 07/08/24 which decreased when exercise was stopped and workload was decreased. Patient's new cardiologist. Dr Shlomo notified. Okay to proceed with exercise.    Expected Outcomes Ramey will continue to participate in cardiac rehab for exercise, nutrition and lifestyle modifications          ITP Comments:  ITP  Comments     Row Name 05/23/24 1028 05/29/24 1651 06/04/24 1359 07/09/24 1547     ITP Comments Wilbert Shlomo, MD: Medical Director.  Introduction to the [facility] / Intensive Cardiac Rehab.  Initial orientation packet reviewed with the patient. 30 Day ITP Review. Fredi started cardiac rehab on 05/29/24. Shiv did well with exercise. 30 Day ITP Review. Zuriel started cardiac rehab on 05/29/24. Kilan is off to a good start  with exercise. 30 Day ITP Review. Jeromie has good attendance and participation with exercise at cardiac rehab.       Comments: See ITP Comments

## 2024-07-10 ENCOUNTER — Telehealth: Payer: Self-pay

## 2024-07-10 ENCOUNTER — Encounter (HOSPITAL_COMMUNITY)
Admission: RE | Admit: 2024-07-10 | Discharge: 2024-07-10 | Disposition: A | Discharge status: Home / Self Care | Source: Ambulatory Visit | Attending: Cardiology | Admitting: Cardiology

## 2024-07-10 DIAGNOSIS — Z48812 Encounter for surgical aftercare following surgery on the circulatory system: Secondary | ICD-10-CM | POA: Diagnosis not present

## 2024-07-10 DIAGNOSIS — I493 Ventricular premature depolarization: Secondary | ICD-10-CM

## 2024-07-10 DIAGNOSIS — I213 ST elevation (STEMI) myocardial infarction of unspecified site: Secondary | ICD-10-CM

## 2024-07-10 DIAGNOSIS — Z951 Presence of aortocoronary bypass graft: Secondary | ICD-10-CM

## 2024-07-10 NOTE — Telephone Encounter (Signed)
-----   Message from Wilbert Bihari sent at 07/08/2024 12:06 PM EDT ----- Geni can we go ahead and get a 3 day ziopatch now instead of October ----- Message ----- From: Cyrus Hadassah ORN, RN Sent: 07/08/2024  12:01 PM EDT To: Wilbert JONELLE Bihari, MD; Geni LITTIE Sar, RN; Katly#  Good afternoon Dr Bihari,  Patient having frequent ectopy couplets and triplets. Exercise stopped. Blood pressure 124/70/ patient asymptomatic. Had Armonie to stop exercise and to restart at a lower workload.PVC's decreased when workload decreased.  Taking medications as prescribed. Onsite provider Katlyn West NP notified. Roper resumed exercise on the treadmill at 2.3/0 he started having increased PVC's and couplets again. Had the patient to stop PVC's were decreased when he decreased the speed to a lower workload. Patient's betablocker was discontinued on 05/27/24 due sinus brady rate of 38. [name] told me to let you know that he does not have anymore metoprolol  he disposed it at the pharmacy. I appreciate your input!  Please review attached Media. Thanks you!  Sincerely, [facility]  Cardiac Rehab

## 2024-07-10 NOTE — Progress Notes (Signed)
 Miran has a zio monitor at home and will place and wear  for 3 days per Dr Shlomo.Hadassah Elpidio Quan RN BSN

## 2024-07-10 NOTE — Telephone Encounter (Signed)
 3 day zio ordered. MC to patient.

## 2024-07-11 ENCOUNTER — Ambulatory Visit: Attending: Cardiology

## 2024-07-11 DIAGNOSIS — I493 Ventricular premature depolarization: Secondary | ICD-10-CM

## 2024-07-11 NOTE — Progress Notes (Unsigned)
 Enrolled for [employer] to mail a ZIO XT long term holter monitor to the patients address on file.

## 2024-07-12 ENCOUNTER — Encounter (HOSPITAL_COMMUNITY)
Admission: RE | Admit: 2024-07-12 | Discharge: 2024-07-12 | Disposition: A | Discharge status: Home / Self Care | Source: Ambulatory Visit | Attending: Cardiology | Admitting: Cardiology

## 2024-07-12 DIAGNOSIS — Z48812 Encounter for surgical aftercare following surgery on the circulatory system: Secondary | ICD-10-CM | POA: Diagnosis not present

## 2024-07-12 DIAGNOSIS — Z951 Presence of aortocoronary bypass graft: Secondary | ICD-10-CM

## 2024-07-12 DIAGNOSIS — I4891 Unspecified atrial fibrillation: Secondary | ICD-10-CM

## 2024-07-12 DIAGNOSIS — I213 ST elevation (STEMI) myocardial infarction of unspecified site: Secondary | ICD-10-CM

## 2024-07-15 ENCOUNTER — Encounter (HOSPITAL_COMMUNITY)
Admission: RE | Admit: 2024-07-15 | Discharge: 2024-07-15 | Disposition: A | Discharge status: Home / Self Care | Source: Ambulatory Visit | Attending: Cardiology

## 2024-07-15 DIAGNOSIS — Z951 Presence of aortocoronary bypass graft: Secondary | ICD-10-CM

## 2024-07-15 DIAGNOSIS — I4891 Unspecified atrial fibrillation: Secondary | ICD-10-CM

## 2024-07-15 DIAGNOSIS — Z48812 Encounter for surgical aftercare following surgery on the circulatory system: Secondary | ICD-10-CM | POA: Diagnosis not present

## 2024-07-15 DIAGNOSIS — I213 ST elevation (STEMI) myocardial infarction of unspecified site: Secondary | ICD-10-CM

## 2024-07-17 ENCOUNTER — Encounter (HOSPITAL_COMMUNITY)
Admission: RE | Admit: 2024-07-17 | Discharge: 2024-07-17 | Disposition: A | Discharge status: Home / Self Care | Source: Ambulatory Visit | Attending: Cardiology

## 2024-07-17 DIAGNOSIS — Z951 Presence of aortocoronary bypass graft: Secondary | ICD-10-CM

## 2024-07-17 DIAGNOSIS — N401 Enlarged prostate with lower urinary tract symptoms: Secondary | ICD-10-CM | POA: Diagnosis not present

## 2024-07-17 DIAGNOSIS — Z48812 Encounter for surgical aftercare following surgery on the circulatory system: Secondary | ICD-10-CM | POA: Diagnosis not present

## 2024-07-17 DIAGNOSIS — R351 Nocturia: Secondary | ICD-10-CM | POA: Diagnosis not present

## 2024-07-17 DIAGNOSIS — I213 ST elevation (STEMI) myocardial infarction of unspecified site: Secondary | ICD-10-CM

## 2024-07-17 DIAGNOSIS — R310 Gross hematuria: Secondary | ICD-10-CM | POA: Diagnosis not present

## 2024-07-17 DIAGNOSIS — R972 Elevated prostate specific antigen [PSA]: Secondary | ICD-10-CM | POA: Diagnosis not present

## 2024-07-17 DIAGNOSIS — R3915 Urgency of urination: Secondary | ICD-10-CM | POA: Diagnosis not present

## 2024-07-19 ENCOUNTER — Encounter (HOSPITAL_COMMUNITY)
Admission: RE | Admit: 2024-07-19 | Discharge: 2024-07-19 | Disposition: A | Discharge status: Home / Self Care | Source: Ambulatory Visit | Attending: Cardiology | Admitting: Cardiology

## 2024-07-19 ENCOUNTER — Encounter: Payer: Self-pay | Admitting: Family Medicine

## 2024-07-19 DIAGNOSIS — I213 ST elevation (STEMI) myocardial infarction of unspecified site: Secondary | ICD-10-CM

## 2024-07-19 DIAGNOSIS — Z951 Presence of aortocoronary bypass graft: Secondary | ICD-10-CM

## 2024-07-19 DIAGNOSIS — Z48812 Encounter for surgical aftercare following surgery on the circulatory system: Secondary | ICD-10-CM | POA: Diagnosis not present

## 2024-07-22 ENCOUNTER — Encounter (HOSPITAL_COMMUNITY)
Admission: RE | Admit: 2024-07-22 | Discharge: 2024-07-22 | Disposition: A | Discharge status: Home / Self Care | Source: Ambulatory Visit | Attending: Cardiology | Admitting: Cardiology

## 2024-07-22 DIAGNOSIS — Z951 Presence of aortocoronary bypass graft: Secondary | ICD-10-CM

## 2024-07-22 DIAGNOSIS — Z48812 Encounter for surgical aftercare following surgery on the circulatory system: Secondary | ICD-10-CM | POA: Diagnosis not present

## 2024-07-22 DIAGNOSIS — I213 ST elevation (STEMI) myocardial infarction of unspecified site: Secondary | ICD-10-CM

## 2024-07-23 DIAGNOSIS — I493 Ventricular premature depolarization: Secondary | ICD-10-CM | POA: Diagnosis not present

## 2024-07-24 ENCOUNTER — Encounter (HOSPITAL_COMMUNITY)
Admission: RE | Admit: 2024-07-24 | Discharge: 2024-07-24 | Disposition: A | Discharge status: Home / Self Care | Source: Ambulatory Visit | Attending: Cardiology | Admitting: Cardiology

## 2024-07-24 DIAGNOSIS — I213 ST elevation (STEMI) myocardial infarction of unspecified site: Secondary | ICD-10-CM

## 2024-07-24 DIAGNOSIS — I4891 Unspecified atrial fibrillation: Secondary | ICD-10-CM

## 2024-07-24 DIAGNOSIS — Z48812 Encounter for surgical aftercare following surgery on the circulatory system: Secondary | ICD-10-CM | POA: Diagnosis not present

## 2024-07-24 DIAGNOSIS — Z951 Presence of aortocoronary bypass graft: Secondary | ICD-10-CM

## 2024-07-25 DIAGNOSIS — R310 Gross hematuria: Secondary | ICD-10-CM | POA: Diagnosis not present

## 2024-07-26 ENCOUNTER — Encounter (HOSPITAL_COMMUNITY)
Admission: RE | Admit: 2024-07-26 | Discharge: 2024-07-26 | Disposition: A | Discharge status: Home / Self Care | Source: Ambulatory Visit | Attending: Cardiology | Admitting: Cardiology

## 2024-07-26 DIAGNOSIS — I213 ST elevation (STEMI) myocardial infarction of unspecified site: Secondary | ICD-10-CM

## 2024-07-26 DIAGNOSIS — Z951 Presence of aortocoronary bypass graft: Secondary | ICD-10-CM

## 2024-07-26 DIAGNOSIS — I4891 Unspecified atrial fibrillation: Secondary | ICD-10-CM

## 2024-07-26 DIAGNOSIS — Z48812 Encounter for surgical aftercare following surgery on the circulatory system: Secondary | ICD-10-CM | POA: Diagnosis not present

## 2024-07-28 ENCOUNTER — Encounter: Payer: Self-pay | Admitting: Family Medicine

## 2024-07-29 ENCOUNTER — Ambulatory Visit: Payer: Self-pay | Admitting: Cardiology

## 2024-07-29 DIAGNOSIS — I493 Ventricular premature depolarization: Secondary | ICD-10-CM | POA: Diagnosis not present

## 2024-07-29 DIAGNOSIS — Z79899 Other long term (current) drug therapy: Secondary | ICD-10-CM

## 2024-07-29 DIAGNOSIS — I4719 Other supraventricular tachycardia: Secondary | ICD-10-CM

## 2024-07-29 DIAGNOSIS — I469 Cardiac arrest, cause unspecified: Secondary | ICD-10-CM

## 2024-07-30 DIAGNOSIS — R3915 Urgency of urination: Secondary | ICD-10-CM | POA: Diagnosis not present

## 2024-07-30 DIAGNOSIS — N401 Enlarged prostate with lower urinary tract symptoms: Secondary | ICD-10-CM | POA: Diagnosis not present

## 2024-07-30 DIAGNOSIS — N210 Calculus in bladder: Secondary | ICD-10-CM | POA: Diagnosis not present

## 2024-07-30 MED ORDER — METOPROLOL SUCCINATE ER 25 MG PO TB24: 12.5000 mg | ORAL_TABLET | Freq: Every day | ORAL | 3 refills | Status: DC

## 2024-07-30 MED ORDER — LOSARTAN POTASSIUM 25 MG PO TABS: ORAL_TABLET | ORAL | 3 refills | Status: AC

## 2024-07-30 NOTE — Telephone Encounter (Signed)
 Discussed with patient that Heart monitor showed normal rhythm with 2 episodes of fast heart rate coming from the top of the heart lasting up to 17 beats called atrial tachycardia. There were some extra heartbeats from the bottom of the heart called PVCs which are benign. Patient scheduled for 2D echo 08/01/24. Labs ordered, patient will complete same day as echo. Nurse visit scheduled for 9/9 for EKG.

## 2024-07-30 NOTE — Telephone Encounter (Signed)
 No further action needed at this time.

## 2024-07-30 NOTE — Telephone Encounter (Signed)
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

## 2024-07-30 NOTE — Telephone Encounter (Signed)
 Hello Dylen, A whole 25 mg losartan  tab twice a day is correct. I sent in a new prescription today for 90-day supply today. Sorry for the confusion.

## 2024-07-31 ENCOUNTER — Encounter (HOSPITAL_COMMUNITY)
Admission: RE | Admit: 2024-07-31 | Discharge: 2024-07-31 | Disposition: A | Discharge status: Home / Self Care | Source: Ambulatory Visit | Attending: Cardiology | Admitting: Cardiology

## 2024-07-31 DIAGNOSIS — I4891 Unspecified atrial fibrillation: Secondary | ICD-10-CM | POA: Insufficient documentation

## 2024-07-31 DIAGNOSIS — I252 Old myocardial infarction: Secondary | ICD-10-CM | POA: Insufficient documentation

## 2024-07-31 DIAGNOSIS — Z951 Presence of aortocoronary bypass graft: Secondary | ICD-10-CM | POA: Insufficient documentation

## 2024-07-31 DIAGNOSIS — Z48812 Encounter for surgical aftercare following surgery on the circulatory system: Secondary | ICD-10-CM | POA: Insufficient documentation

## 2024-07-31 DIAGNOSIS — I213 ST elevation (STEMI) myocardial infarction of unspecified site: Secondary | ICD-10-CM

## 2024-08-01 ENCOUNTER — Ambulatory Visit (HOSPITAL_COMMUNITY)
Admission: RE | Admit: 2024-08-01 | Discharge: 2024-08-01 | Disposition: A | Discharge status: Home / Self Care | Source: Ambulatory Visit | Attending: Cardiology | Admitting: Cardiology

## 2024-08-01 DIAGNOSIS — I2583 Coronary atherosclerosis due to lipid rich plaque: Secondary | ICD-10-CM | POA: Insufficient documentation

## 2024-08-01 DIAGNOSIS — I2510 Atherosclerotic heart disease of native coronary artery without angina pectoris: Secondary | ICD-10-CM | POA: Diagnosis not present

## 2024-08-01 LAB — ECHOCARDIOGRAM COMPLETE
Area-P 1/2: 3.91 cm2
MV M vel: 5.6 m/s
MV Peak grad: 125.4 mmHg
S' Lateral: 2.3 cm

## 2024-08-02 ENCOUNTER — Encounter (HOSPITAL_COMMUNITY)
Admission: RE | Admit: 2024-08-02 | Discharge: 2024-08-02 | Disposition: A | Discharge status: Home / Self Care | Source: Ambulatory Visit | Attending: Cardiology | Admitting: Cardiology

## 2024-08-02 ENCOUNTER — Ambulatory Visit: Payer: Self-pay | Admitting: Cardiology

## 2024-08-02 DIAGNOSIS — I213 ST elevation (STEMI) myocardial infarction of unspecified site: Secondary | ICD-10-CM

## 2024-08-02 DIAGNOSIS — Z951 Presence of aortocoronary bypass graft: Secondary | ICD-10-CM | POA: Diagnosis not present

## 2024-08-05 ENCOUNTER — Encounter: Payer: Self-pay | Admitting: Cardiology

## 2024-08-05 ENCOUNTER — Encounter (HOSPITAL_COMMUNITY)
Admission: RE | Admit: 2024-08-05 | Discharge: 2024-08-05 | Disposition: A | Discharge status: Home / Self Care | Source: Ambulatory Visit | Attending: Cardiology | Admitting: Cardiology

## 2024-08-05 DIAGNOSIS — I4891 Unspecified atrial fibrillation: Secondary | ICD-10-CM

## 2024-08-05 DIAGNOSIS — Z951 Presence of aortocoronary bypass graft: Secondary | ICD-10-CM | POA: Diagnosis not present

## 2024-08-05 DIAGNOSIS — I213 ST elevation (STEMI) myocardial infarction of unspecified site: Secondary | ICD-10-CM

## 2024-08-05 NOTE — Progress Notes (Signed)
 Cardiac Individual Treatment Plan  Patient Details  Name: Wayne Lamb MRN: [id number] Date of Birth: 1946/03/25 Referring Provider:   Flowsheet Row INTENSIVE CARDIAC REHAB ORIENT from 05/23/2024 in [facility]  Referring Provider Dr. Terrall (Dr. Wilbert Bihari covering)    Initial Encounter Date:  Flowsheet Row INTENSIVE CARDIAC REHAB ORIENT from 05/23/2024 in [facility]  Date 05/23/24    Visit Diagnosis: 03/21/24 ST elevation myocardial infarction (STEMI), unspecified artery (HCC)  03/21/24 S/P CABG x 4  Atrial fibrillation, unspecified type (HCC)  Patient's Home Medications on Admission:  Current Outpatient Medications:    acetaminophen  (TYLENOL ) 500 MG tablet, Take 2 tablets by mouth every 6 (six) hours as needed for mild pain (pain score 1-3). (Patient not taking: Reported on 06/25/2024), Disp: , Rfl:    ascorbic acid (VITAMIN C) 500 MG tablet, Take 500 mg by mouth daily., Disp: , Rfl:    aspirin 81 MG chewable tablet, Chew 81 mg by mouth daily., Disp: , Rfl:    atorvastatin  (LIPITOR) 80 MG tablet, Take 1 tablet (80 mg total) by mouth at bedtime., Disp: 90 tablet, Rfl: 3   clopidogrel  (PLAVIX ) 75 MG tablet, TAKE 1 TABLET BY MOUTH EVERY DAY, Disp: 30 tablet, Rfl: 1   co-enzyme Q-10 30 MG capsule, Take 100 mg by mouth daily., Disp: , Rfl:    ezetimibe  (ZETIA ) 10 MG tablet, Take 1 tablet (10 mg total) by mouth at bedtime., Disp: 90 tablet, Rfl: 3   losartan  (COZAAR ) 25 MG tablet, 1 tab po bid, Disp: 180 tablet, Rfl: 3   metoprolol  succinate (TOPROL  XL) 25 MG 24 hr tablet, Take 0.5 tablets (12.5 mg total) by mouth daily., Disp: 45 tablet, Rfl: 3   omeprazole  (PRILOSEC) 20 MG capsule, Take 1 capsule (20 mg total) by mouth every morning., Disp: 90 capsule, Rfl: 3   zinc gluconate 50 MG tablet, Take 50 mg by mouth daily., Disp: , Rfl:   Past Medical History: Past Medical  History:  Diagnosis Date   BPH with obstruction/lower urinary tract symptoms    CAD (coronary artery disease), native coronary artery    S/P inferolateral STEMI/Vfib arrest and Cardiac cath revealed 90% ostial to proximal LAD, 80% mid LAD, 70% D1, 99% OM1 and occluded proximal RCA.  S/P CABG with SVG to PDA, SVG to OM1, SVG to D1 and LIMA to LAD.   Cataract    Diabetes mellitus without complication (HCC)    Dx 05/2023-->fasting gluc 200, Hba1c 7.7%   Diverticulosis    2014 colonoscopy   Elevated PSA    MRI prostate was done 2016 to eval persistently rising PSAs (no biopsy had been done): this showed no sign of prostate ca.  Stable 07/2017, 01/2018, 01/2019, 12/2019, 05/2020.   Encounter for hepatitis C virus screening test for high risk patient    Done by [facility] physicians 01/18/17   Hypercholesterolemia    Leg DVT (deep venous thromboembolism), acute, left (HCC)    11/2022   Lumbar spondylosis    MRI L spine 2005: severe facet deg, SI deg   Obesity, Class II, BMI 35-39.9    OSA on CPAP    compliant   PAF (paroxysmal atrial fibrillation) (HCC)    Postop CABG 2025   Prostate nodule 2016   prostate nodule or ridge in R prostate lobe--w/u reassuring 2016    Pulmonary nodule    11/2022.  F/u imaging stable,  no further imaging needed.   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   Sensorineural hearing loss (SNHL) of both ears    [facility] 06/2024-->amplification recommended   STEMI (ST elevation myocardial infarction) (HCC)    Vfib arretst 02/2024-->CABG   Tubular adenoma of colon 2014   recommend repeat colonoscopy 2019    Tobacco Use: Social History   Tobacco Use  Smoking Status Former   Current packs/day: 0.00   Average packs/day: 1 pack/day for 15.0 years (15.0 ttl pk-yrs)   Types: Cigarettes   Start date: 04/19/1965   Quit date: 04/19/1980   Years since quitting: 44.3  Smokeless Tobacco Never    Labs: Review Flowsheet  More data exists      Latest Ref  Rng & Units 02/15/2019 08/23/2019 06/28/2023 01/01/2024 06/11/2024  Labs for ITP Cardiac and Pulmonary Rehab  Cholestrol 0 - 200 mg/dL 727  763  - - 881   LDL (calc) 0 - 99 mg/dL 791  847  - - 47   HDL-C >39.00 mg/dL 60.99  48.89  - - 50.79   Trlycerides 0.0 - 149.0 mg/dL 874.9  835.9  - - 893.9   Hemoglobin A1c 5.7 - 6.4 % 0.0 - 7.0 % 4.0 - 5.6 % 4.0 - 5.6 % 5.7  6.2  7.7  9.0  9.0  9.0  9.0  6.1  6.1  6.1  6.1     Details       Multiple values from one day are sorted in reverse-chronological order         Capillary Blood Glucose: Lab Results  Component Value Date   GLUCAP 131 (H) 06/12/2024   GLUCAP 106 (H) 06/05/2024   GLUCAP 126 (H) 06/03/2024   GLUCAP 176 (H) 06/03/2024   GLUCAP 131 (H) 05/29/2024     Exercise Target Goals: Exercise Program Goal: Individual exercise prescription set using results from initial 6 min walk test and THRR while considering  patient's activity barriers and safety.   Exercise Prescription Goal: Initial exercise prescription builds to 30-45 minutes a day of aerobic activity, 2-3 days per week.  Home exercise guidelines will be given to patient during program as part of exercise prescription that the participant will acknowledge.  Activity Barriers & Risk Stratification:  Activity Barriers & Cardiac Risk Stratification - 05/23/24 1423       Activity Barriers & Cardiac Risk Stratification   Activity Barriers Arthritis;Deconditioning;Balance Concerns;History of Falls    Cardiac Risk Stratification High          6 Minute Walk:  6 Minute Walk     Row Name 05/23/24 1418         6 Minute Walk   Phase Initial     Distance 1275 feet     Walk Time 6 minutes     # of Rest Breaks 0     MPH 2.41     METS 2.17     RPE 11     Perceived Dyspnea  0     VO2 Peak 7.58     Symptoms Yes (comment)     Comments Patient remained asymptomatic, but had PVC's during walk test only, and irregular bradycardia throughout stay.     Resting HR 45 bpm      Resting BP 104/68     Resting Oxygen Saturation  97 %     Exercise Oxygen Saturation  during 6 min walk 100 %     Max  Ex. HR 81 bpm     Max Ex. BP 124/76     2 Minute Post BP 118/72        Oxygen Initial Assessment:   Oxygen Re-Evaluation:   Oxygen Discharge (Final Oxygen Re-Evaluation):   Initial Exercise Prescription:  Initial Exercise Prescription - 05/23/24 1400       Date of Initial Exercise RX and Referring Provider   Date 05/23/24    Referring Provider Dr. Terrall (Dr. Wilbert Bihari covering)    Expected Discharge Date 08/14/24      Treadmill   MPH 1.8    Grade 0    Minutes 15    METs 2.38      Recumbant Bike   Level 1    RPM 50    Watts 10    Minutes 15    METs 1.9      Prescription Details   Frequency (times per week) 3    Duration Progress to 30 minutes of continuous aerobic without signs/symptoms of physical distress      Intensity   THRR 40-80% of Max Heartrate 57-114    Ratings of Perceived Exertion 11-13    Perceived Dyspnea 0-4      Progression   Progression Continue progressive overload as per policy without signs/symptoms or physical distress.      Resistance Training   Training Prescription Yes    Weight 3    Reps 10-15          Perform Capillary Blood Glucose checks as needed.  Exercise Prescription Changes:   Exercise Prescription Changes     Row Name 05/29/24 1031 06/10/24 1028 07/01/24 1034 07/15/24 1036 07/31/24 1027     Response to Exercise   Blood Pressure (Admit) 138/78 136/60 122/60 122/68 112/60   Blood Pressure (Exercise) 138/74 164/70 -- -- --   Blood Pressure (Exit) 104/60 112/70 114/70 118/72 114/72   Heart Rate (Admit) 58 bpm 49 bpm 49 bpm 66 bpm 74 bpm   Heart Rate (Exercise) 108 bpm 109 bpm 120 bpm 116 bpm 119 bpm   Heart Rate (Exit) 70 bpm 73 bpm 83 bpm 79 bpm 78 bpm   Rating of Perceived Exertion (Exercise) 8 10 9 9 9    Symptoms None None None None None   Comments Off to a good start with exercise. -- -- --  Reviewed goals with Marshaun.   Duration Continue with 30 min of aerobic exercise without signs/symptoms of physical distress. Continue with 30 min of aerobic exercise without signs/symptoms of physical distress. Continue with 30 min of aerobic exercise without signs/symptoms of physical distress. Continue with 30 min of aerobic exercise without signs/symptoms of physical distress. Continue with 30 min of aerobic exercise without signs/symptoms of physical distress.   Intensity THRR unchanged THRR unchanged THRR unchanged THRR unchanged THRR unchanged     Progression   Progression Continue to progress workloads to maintain intensity without signs/symptoms of physical distress. Continue to progress workloads to maintain intensity without signs/symptoms of physical distress. Continue to progress workloads to maintain intensity without signs/symptoms of physical distress. Continue to progress workloads to maintain intensity without signs/symptoms of physical distress. Continue to progress workloads to maintain intensity without signs/symptoms of physical distress.   Average METs 2.3 3.1 3.6 3.4 3.6     Resistance Training   Training Prescription No Yes Yes Yes No   Weight Relaxation day, no weights. 3 lbs 3 lbs 5 lbs Relaxation day, no weights.   Reps -- 10-15 10-15 10-15 --  Time -- 5 Minutes 5 Minutes 5 Minutes --     Interval Training   Interval Training No No No No No     Treadmill   MPH 2 2.5 2.3 2.3 2.5   Grade 0 0 0 0 0   Minutes 15 15 15 15 15    METs 2.53 2.91 2.76 2.76 2.91     Recumbant Bike   Level 1 3 3 3 5    RPM 88 78 72 68 58   Watts 23 47 71 63 72   Minutes 15 15 15 15 15    METs 2.1 3.3 4.4 4.1 4.4     Home Exercise Plan   Plans to continue exercise at -- -- [employer] (comment)  Walking, equipment at [employer] (comment)  Walking, equipment at [employer] (comment)  Walking, equipment at gym   Frequency -- -- Add 2 additional days to program  exercise sessions. Add 2 additional days to program exercise sessions. Add 2 additional days to program exercise sessions.   Initial Home Exercises Provided -- -- 06/17/24 06/17/24 06/17/24      Exercise Comments:   Exercise Comments     Row Name 05/29/24 1134 06/17/24 1107 07/03/24 1052 07/17/24 1058 07/31/24 1107   Exercise Comments Emiel tolerated low intensity exercise well without symptoms. Oriented him to the exercise equipment and stretching routine. Reviewed exercise action plan, goals, and target heart rate range with Norleen. Reviewed METs and goals with Norleen. Reviewed METs with Norleen. Reviewed goals with Norleen.      Exercise Goals and Review:   Exercise Goals     Row Name 05/23/24 1427             Exercise Goals   Increase Physical Activity Yes       Intervention Provide advice, education, support and counseling about physical activity/exercise needs.;Develop an individualized exercise prescription for aerobic and resistive training based on initial evaluation findings, risk stratification, comorbidities and participant's personal goals.       Expected Outcomes Short Term: Attend rehab on a regular basis to increase amount of physical activity.;Long Term: Exercising regularly at least 3-5 days a week.;Long Term: Add in home exercise to make exercise part of routine and to increase amount of physical activity.       Increase Strength and Stamina Yes       Intervention Provide advice, education, support and counseling about physical activity/exercise needs.;Develop an individualized exercise prescription for aerobic and resistive training based on initial evaluation findings, risk stratification, comorbidities and participant's personal goals.       Expected Outcomes Short Term: Increase workloads from initial exercise prescription for resistance, speed, and METs.;Short Term: Perform resistance training exercises routinely during rehab and add in resistance training at home;Long  Term: Improve cardiorespiratory fitness, muscular endurance and strength as measured by increased METs and functional capacity ( )       Able to understand and use rate of perceived exertion (RPE) scale Yes       Intervention Provide education and explanation on how to use RPE scale       Expected Outcomes Short Term: Able to use RPE daily in rehab to express subjective intensity level;Long Term:  Able to use RPE to guide intensity level when exercising independently       Knowledge and understanding of Target Heart Rate Range (THRR) Yes       Intervention Provide education and explanation of THRR including how the numbers were predicted and where  they are located for reference       Expected Outcomes Short Term: Able to state/look up THRR;Long Term: Able to use THRR to govern intensity when exercising independently;Short Term: Able to use daily as guideline for intensity in rehab       Understanding of Exercise Prescription Yes       Intervention Provide education, explanation, and written materials on patient's individual exercise prescription       Expected Outcomes Short Term: Able to explain program exercise prescription;Long Term: Able to explain home exercise prescription to exercise independently          Exercise Goals Re-Evaluation :  Exercise Goals Re-Evaluation     Row Name 05/29/24 1134 06/17/24 1107 07/03/24 1052 07/31/24 1107       Exercise Goal Re-Evaluation   Exercise Goals Review Increase Physical Activity;Increase Strength and Stamina;Able to understand and use rate of perceived exertion (RPE) scale Increase Physical Activity;Increase Strength and Stamina;Able to understand and use rate of perceived exertion (RPE) scale;Understanding of Exercise Prescription;Knowledge and understanding of Target Heart Rate Range (THRR);Able to check pulse independently Increase Physical Activity;Increase Strength and Stamina;Able to understand and use rate of perceived exertion (RPE)  scale;Understanding of Exercise Prescription;Knowledge and understanding of Target Heart Rate Range (THRR);Able to check pulse independently Increase Physical Activity;Increase Strength and Stamina;Able to understand and use rate of perceived exertion (RPE) scale;Understanding of Exercise Prescription;Knowledge and understanding of Target Heart Rate Range (THRR);Able to check pulse independently    Comments Einar was able to understand and use RPE scale appropriately. Reviewed exercise prescription with Norleen. He will walk at the gym and use the equipment there. He has a smart watch to monitor his pulse at home. Aja continues to make good progress with exercise. He will increase hand weights from 4 to 5 lbs next session. Reuben is walking daily at the gym and using the recumbent bike and treadmill. He will talk to his cardiologist regarding when he can return to the weight machines.    Expected Outcomes Progress workloads as tolerated to help improve cardiorespiratory fitness. Progress workloads within THRR. Walk or exercise on the equipment at the gym at least 60 minutes to achieve 150 minutes of aerobic exercise/week. Continue to progress workloads to help increase strength and stamina. Yussuf will continue daily aerobic exercise. He will resume weight machines at the gym upon clearance from his cardiologist.       Discharge Exercise Prescription (Final Exercise Prescription Changes):  Exercise Prescription Changes - 07/31/24 1027       Response to Exercise   Blood Pressure (Admit) 112/60    Blood Pressure (Exit) 114/72    Heart Rate (Admit) 74 bpm    Heart Rate (Exercise) 119 bpm    Heart Rate (Exit) 78 bpm    Rating of Perceived Exertion (Exercise) 9    Symptoms None    Comments Reviewed goals with Norleen.    Duration Continue with 30 min of aerobic exercise without signs/symptoms of physical distress.    Intensity THRR unchanged      Progression   Progression Continue to progress workloads to  maintain intensity without signs/symptoms of physical distress.    Average METs 3.6      Resistance Training   Training Prescription No    Weight Relaxation day, no weights.      Interval Training   Interval Training No      Treadmill   MPH 2.5    Grade 0    Minutes 15  METs 2.91      Recumbant Bike   Level 5    RPM 58    Watts 72    Minutes 15    METs 4.4      Home Exercise Plan   Plans to continue exercise at [employer] (comment)   Walking, equipment at gym   Frequency Add 2 additional days to program exercise sessions.    Initial Home Exercises Provided 06/17/24          Nutrition:  Target Goals: Understanding of nutrition guidelines, daily intake of sodium 1500mg , cholesterol 200mg , calories 30% from fat and 7% or less from saturated fats, daily to have 5 or more servings of fruits and vegetables.  Biometrics:  Pre Biometrics - 05/23/24 1428       Pre Biometrics   Height 5' 8 (1.727 m)    Weight 87 kg    Waist Circumference 39.75 inches    Hip Circumference 39 inches    Waist to Hip Ratio 1.02 %    BMI (Calculated) 29.17    Triceps Skinfold 11 mm    % Body Fat 27 %    Grip Strength 23 kg    Flexibility --   Unable to reach   Single Leg Stand 2 seconds           Nutrition Therapy Plan and Nutrition Goals:  Nutrition Therapy & Goals - 06/28/24 1134       Nutrition Therapy   Diet Heart Healthy Diet    Drug/Food Interactions Statins/Certain Fruits      Personal Nutrition Goals   Nutrition Goal Patient to identify strategies for reducing cardiovascular risk by attending the [employer] education and nutrition series weekly.   goal in action.   Personal Goal #2 Patient to improve diet quality by using the plate method as a guide for meal planning to include lean protein/plant protein, fruits, vegetables, whole grains, nonfat dairy as part of a well-balanced diet.   goal in action.   Personal Goal #3 Patient to identify strategies for weight  loss of 0.5-2.0# per week.   goal not met.   Comments Goals in action. Draiden has medical history of CABGx4, STEMI, Afib, DM2, TIA symptoms. LDL has improved to goal. J8r has improved to a prediabetic range. He has maintained his weight since starting with our program. Patient will benefit from participation in intensive cardiac rehab for nutrition, exercise, and lifestyle modification.      Intervention Plan   Intervention Prescribe, educate and counsel regarding individualized specific dietary modifications aiming towards targeted core components such as weight, hypertension, lipid management, diabetes, heart failure and other comorbidities.;Nutrition handout(s) given to patient.    Expected Outcomes Short Term Goal: Understand basic principles of dietary content, such as calories, fat, sodium, cholesterol and nutrients.;Long Term Goal: Adherence to prescribed nutrition plan.          Nutrition Assessments:  Nutrition Assessments - 05/23/24 1449       Rate Your Plate Scores   Pre Score 62         MEDIFICTS Score Key: >=70 Need to make dietary changes  40-70 Heart Healthy Diet <= 40 Therapeutic Level Cholesterol Diet   Flowsheet Row INTENSIVE CARDIAC REHAB ORIENT from 05/23/2024 in [facility]  Picture Your Plate Total Score on Admission 62   Picture Your Plate Scores: <59 Unhealthy dietary pattern with much room for improvement. 41-50 Dietary pattern unlikely to meet recommendations for  good health and room for improvement. 51-60 More healthful dietary pattern, with some room for improvement.  >60 Healthy dietary pattern, although there may be some specific behaviors that could be improved.    Nutrition Goals Re-Evaluation:  Nutrition Goals Re-Evaluation     Row Name 05/29/24 1053 06/28/24 1134           Goals   Current Weight 191 lb 12.8 oz (87 kg) 194 lb 0.1 oz (88 kg)      Comment cholester 253, LDL 161, HDL  44, A1c 8.1 LDL 47, triglycerides 106, HDL 49, A1c 6.1      Expected Outcome Emidio has medical history of CABGx4, STEMI, Afib, DM2, TIA symptoms. LDL is not at goal. A1c is not at goal. Patient will benefit from participation in intensive cardiac rehab for nutrition, exercise, and lifestyle modification. Goals in action. Camran has medical history of CABGx4, STEMI, Afib, DM2, TIA symptoms. LDL has improved to goal. J8r has improved to a prediabetic range. He has maintained his weight since starting with our program. Patient will benefit from participation in intensive cardiac rehab for nutrition, exercise, and lifestyle modification.         Nutrition Goals Re-Evaluation:  Nutrition Goals Re-Evaluation     Row Name 05/29/24 1053 06/28/24 1134           Goals   Current Weight 191 lb 12.8 oz (87 kg) 194 lb 0.1 oz (88 kg)      Comment cholester 253, LDL 161, HDL 44, A1c 8.1 LDL 47, triglycerides 106, HDL 49, A1c 6.1      Expected Outcome Derwood has medical history of CABGx4, STEMI, Afib, DM2, TIA symptoms. LDL is not at goal. A1c is not at goal. Patient will benefit from participation in intensive cardiac rehab for nutrition, exercise, and lifestyle modification. Goals in action. Sandip has medical history of CABGx4, STEMI, Afib, DM2, TIA symptoms. LDL has improved to goal. J8r has improved to a prediabetic range. He has maintained his weight since starting with our program. Patient will benefit from participation in intensive cardiac rehab for nutrition, exercise, and lifestyle modification.         Nutrition Goals Discharge (Final Nutrition Goals Re-Evaluation):  Nutrition Goals Re-Evaluation - 06/28/24 1134       Goals   Current Weight 194 lb 0.1 oz (88 kg)    Comment LDL 47, triglycerides 106, HDL 49, A1c 6.1    Expected Outcome Goals in action. Milbert has medical history of CABGx4, STEMI, Afib, DM2, TIA symptoms. LDL has improved to goal. J8r has improved to a prediabetic range. He has maintained  his weight since starting with our program. Patient will benefit from participation in intensive cardiac rehab for nutrition, exercise, and lifestyle modification.          Psychosocial: Target Goals: Acknowledge presence or absence of significant depression and/or stress, maximize coping skills, provide positive support system. Participant is able to verbalize types and ability to use techniques and skills needed for reducing stress and depression.  Initial Review & Psychosocial Screening:  Initial Psych Review & Screening - 05/23/24 1412       Initial Review   Current issues with None Identified;Current Sleep Concerns   Some mild sleep concerns, but says it in normal for him     Family Dynamics   Good Support System? Yes   He has good support from his wife, church family and friends.     Barriers   Psychosocial barriers to participate  in program There are no identifiable barriers or psychosocial needs.;The patient should benefit from training in stress management and relaxation.      Screening Interventions   Interventions Encouraged to exercise;Provide feedback about the scores to participant    Expected Outcomes Long Term Goal: Stressors or current issues are controlled or eliminated.;Short Term goal: Identification and review with participant of any Quality of Life or Depression concerns found by scoring the questionnaire.;Long Term goal: The participant improves quality of Life and PHQ9 Scores as seen by post scores and/or verbalization of changes   He has no additional needs at this time         Quality of Life Scores:  Quality of Life - 05/23/24 1446       Quality of Life   Select Quality of Life      Quality of Life Scores   Health/Function Pre 27.37 %    Socioeconomic Pre 30 %    Psych/Spiritual Pre 29.14 %    Family Pre 28.5 %    GLOBAL Pre 28.44 %         Scores of 19 and below usually indicate a poorer quality of life in these areas.  A difference of  2-3  points is a clinically meaningful difference.  A difference of 2-3 points in the total score of the Quality of Life Index has been associated with significant improvement in overall quality of life, self-image, physical symptoms, and general health in studies assessing change in quality of life.  PHQ-9: Review Flowsheet  More data exists      06/11/2024 05/23/2024 01/31/2024 07/04/2023 06/28/2023  Depression screen PHQ 2/9  Decreased Interest 0 0 0 0 0  Down, Depressed, Hopeless 0 0 0 0 0  PHQ - 2 Score 0 0 0 0 0  Altered sleeping 1 1 0 - 0  Tired, decreased energy 0 1 0 - 1  Change in appetite 0 0 0 - 0  Feeling bad or failure about yourself  0 0 0 - 0  Trouble concentrating 0 0 0 - 0  Moving slowly or fidgety/restless 0 0 0 - 0  Suicidal thoughts 0 0 0 - 0  PHQ-9 Score 1 2 0 - 1  Difficult doing work/chores Not difficult at all Not difficult at all Not difficult at all - Not difficult at all   Interpretation of Total Score  Total Score Depression Severity:  1-4 = Minimal depression, 5-9 = Mild depression, 10-14 = Moderate depression, 15-19 = Moderately severe depression, 20-27 = Severe depression   Psychosocial Evaluation and Intervention:   Psychosocial Re-Evaluation:  Psychosocial Re-Evaluation     Row Name 05/29/24 1652 06/04/24 1404 07/09/24 1548 08/05/24 1756       Psychosocial Re-Evaluation   Current issues with None Identified None Identified None Identified None Identified    Comments Karthik did not voice any increased concerns or stressors on his first day of exercise Ainsley did not voice any increased concerns or stressors  during  exercise at cardiac rehab. Will review PHQ9 in the upcoming week Darlene says he gets up a lot during the night due to having to void frequently. Mattew has not voiced any increased concerns or stressors. Kalyb has not voiced any increased concerns or stressors during exercise at cardiac rehab    Interventions -- -- -- Encouraged to attend Cardiac  Rehabilitation for the exercise    Continue Psychosocial Services  No Follow up required No Follow up required No Follow up required  No Follow up required       Psychosocial Discharge (Final Psychosocial Re-Evaluation):  Psychosocial Re-Evaluation - 08/05/24 1756       Psychosocial Re-Evaluation   Current issues with None Identified    Comments Willies has not voiced any increased concerns or stressors during exercise at cardiac rehab    Interventions Encouraged to attend Cardiac Rehabilitation for the exercise    Continue Psychosocial Services  No Follow up required          Vocational Rehabilitation: Provide vocational rehab assistance to qualifying candidates.   Vocational Rehab Evaluation & Intervention:  Vocational Rehab - 05/23/24 1415       Initial Vocational Rehab Evaluation & Intervention   Assessment shows need for Vocational Rehabilitation No   Patient is retired. No needs         Education: Education Goals: Education classes will be provided on a weekly basis, covering required topics. Participant will state understanding/return demonstration of topics presented.    Education     Row Name 05/29/24 1500     Education   Cardiac Education Topics Pritikin   Customer service manager   Weekly Topic International Cuisine- Spotlight on the [employer] Zones   Instruction Review Code 1- Verbalizes Understanding   Class Start Time 1145   Class Stop Time 1225   Class Time Calculation (min) 40 min    Row Name 06/03/24 1300     Education   Cardiac Education Topics Pritikin   Geographical information systems officer Psychosocial   Psychosocial Workshop Healthy Sleep for a Healthy Heart   Instruction Review Code 1- Verbalizes Understanding   Class Start Time 1150   Class Stop Time 1230   Class Time Calculation (min) 40 min    Row Name 06/05/24 1100     Education   Cardiac Education Topics  Pritikin   Customer service manager   Weekly Topic Simple Sides and Sauces   Instruction Review Code 1- Verbalizes Understanding   Class Start Time 1145   Class Stop Time 1225   Class Time Calculation (min) 40 min    Row Name 06/07/24 1200     Education   Cardiac Education Topics Pritikin   Nurse, children's Exercise Physiologist   Select Psychosocial   Psychosocial How Our Thoughts Can Heal Our Hearts   Instruction Review Code 1- Verbalizes Understanding   Class Start Time 1153   Class Stop Time 1241   Class Time Calculation (min) 48 min    Row Name 06/10/24 1200     Education   Cardiac Education Topics Pritikin   Hospital doctor Education   General Education Hypertension and Heart Disease   Instruction Review Code 1- Verbalizes Understanding   Class Start Time 1150   Class Stop Time 1225   Class Time Calculation (min) 35 min    Row Name 06/12/24 1100     Education   Cardiac Education Topics Pritikin   Customer service manager   Weekly Topic Powerhouse Plant-Based Proteins   Instruction Review Code 1- Verbalizes Understanding   Class Start Time 1145   Class Stop Time 1225  Class Time Calculation (min) 40 min    Row Name 06/14/24 1300     Education   Cardiac Education Topics Pritikin   Select Workshops     Workshops   Educator Exercise Physiologist   Select Exercise   Exercise Workshop Managing Heart Disease: Your Path to a Healthier Heart   Instruction Review Code 1- Verbalizes Understanding   Class Start Time 1148   Class Stop Time 1239   Class Time Calculation (min) 51 min    Row Name 06/17/24 1300     Education   Cardiac Education Topics Pritikin   Geographical information systems officer Psychosocial   Psychosocial Workshop From Head  to Heart: The Power of a Healthy Outlook   Instruction Review Code 1- Verbalizes Understanding   Class Start Time 1152   Class Stop Time 1234   Class Time Calculation (min) 42 min    Row Name 06/19/24 1100     Education   Cardiac Education Topics Pritikin   Customer service manager   Weekly Topic Tasty Appetizers and Snacks   Instruction Review Code 1- Verbalizes Understanding   Class Start Time 1145   Class Stop Time 1225   Class Time Calculation (min) 40 min    Row Name 06/21/24 1100     Education   Cardiac Education Topics Pritikin   Hospital doctor Education   General Education Heart Disease Risk Reduction   Instruction Review Code 1- Verbalizes Understanding   Class Start Time 1150   Class Stop Time 1240   Class Time Calculation (min) 50 min    Row Name 06/24/24 1300     Education   Cardiac Education Topics Pritikin   Nurse, children's Exercise Physiologist   Select Psychosocial   Psychosocial Healthy Minds, Bodies, Hearts   Instruction Review Code 1- Verbalizes Understanding   Class Start Time 1157   Class Stop Time 1233   Class Time Calculation (min) 36 min    Row Name 06/26/24 1600     Education   Cardiac Education Topics Pritikin   Customer service manager   Weekly Topic Adding Flavor - Sodium-Free   Instruction Review Code 1- Verbalizes Understanding   Class Start Time 1140   Class Stop Time 1215   Class Time Calculation (min) 35 min    Row Name 06/28/24 1100     Education   Cardiac Education Topics Pritikin   Select Workshops     Workshops   Educator Exercise Physiologist   Select Exercise   Exercise Workshop Location manager and Fall Prevention   Instruction Review Code 1- Verbalizes Understanding   Class Start Time 1150   Class Stop Time 1240   Class Time  Calculation (min) 50 min    Row Name 07/01/24 1500     Education   Cardiac Education Topics Pritikin   Glass blower/designer Nutrition   Nutrition Workshop Label Reading   Instruction Review Code 1- Tax inspector   Class Start Time 1145   Class Stop Time 1230   Class Time Calculation (min) 45 min    Row Name 07/03/24 1000  Education   Cardiac Education Topics Pritikin   Customer service manager   Weekly Topic Fast and Healthy Breakfasts   Instruction Review Code 1- Verbalizes Understanding   Class Start Time 1145   Class Stop Time 1220   Class Time Calculation (min) 35 min    Row Name 07/05/24 1000     Education   Cardiac Education Topics Pritikin   Select Core Videos     Core Videos   Educator Dietitian   Select Nutrition   Nutrition Other   Instruction Review Code 1- Verbalizes Understanding   Class Start Time 1145   Class Stop Time 1225   Class Time Calculation (min) 40 min    Row Name 07/08/24 1100     Education   Cardiac Education Topics Pritikin   Hospital doctor Education   General Education Metabolic Syndrome and Belly Fat   Instruction Review Code 1- Verbalizes Understanding   Class Start Time 1200   Class Stop Time 1235   Class Time Calculation (min) 35 min    Row Name 07/10/24 1100     Education   Cardiac Education Topics Pritikin   Customer service manager   Weekly Topic Personalizing Your Pritikin Plate   Instruction Review Code 1- Verbalizes Understanding   Class Start Time 1145   Class Stop Time 1225   Class Time Calculation (min) 40 min    Row Name 07/12/24 1100     Education   Cardiac Education Topics Pritikin   Licensed conveyancer Nutrition   Nutrition Overview of the Pritikin  Eating Plan   Instruction Review Code [place] Understanding    Row Name 07/15/24 1100     Education   Cardiac Education Topics Pritikin   Select Workshops     Workshops   Educator Exercise Physiologist   Select Psychosocial   Psychosocial Workshop Recognizing and Reducing Stress   Instruction Review Code 1- Verbalizes Understanding   Class Start Time 1150   Class Stop Time 1232   Class Time Calculation (min) 42 min    Row Name 07/17/24 1100     Education   Cardiac Education Topics Pritikin   Orthoptist   Educator Dietitian   Weekly Topic Delicious Desserts   Instruction Review Code 1- Verbalizes Understanding   Class Start Time 1145   Class Stop Time 1225   Class Time Calculation (min) 40 min    Row Name 07/19/24 1100     Education   Cardiac Education Topics Pritikin   Nurse, children's   Educator Dietitian   Select Nutrition   Nutrition Calorie Density   Instruction Review Code 1- Verbalizes Understanding   Class Start Time 1145   Class Stop Time 1225   Class Time Calculation (min) 40 min    Row Name 07/22/24 1100     Education   Cardiac Education Topics Pritikin   Geographical information systems officer Exercise   Exercise Workshop Exercise Basics: Building Your Action Plan   Instruction Review Code 1- Verbalizes Understanding   Class Start Time 1155   Class Stop  Time 1248   Class Time Calculation (min) 53 min    Row Name 07/24/24 1100     Education   Cardiac Education Topics Pritikin   Customer service manager   Weekly Topic Efficiency Cooking - Meals in a Snap   Instruction Review Code 1- Verbalizes Understanding   Class Start Time 1145   Class Stop Time 1230   Class Time Calculation (min) 45 min    Row Name 07/26/24 1100     Education   Cardiac Education Topics Pritikin   Engineering geologist Exercise Education   Exercise Education Move It!   Instruction Review Code 1- Verbalizes Understanding   Class Start Time 1145   Class Stop Time 1224   Class Time Calculation (min) 39 min    Row Name 07/31/24 1100     Education   Cardiac Education Topics Pritikin   Orthoptist   Educator Dietitian   Weekly Topic One-Pot Wonders   Instruction Review Code 1- Verbalizes Understanding   Class Start Time 1146   Class Stop Time 1225   Class Time Calculation (min) 39 min    Row Name 08/02/24 1100     Education   Cardiac Education Topics Pritikin   Engineer, mining Education   General Education Hypertension and Heart Disease   Instruction Review Code 1- Verbalizes Understanding   Class Start Time 1153   Class Stop Time 1230   Class Time Calculation (min) 37 min    Row Name 08/05/24 1100     Education   Cardiac Education Topics Pritikin   Geographical information systems officer Psychosocial   Psychosocial Workshop Focused Goals, Sustainable Changes   Instruction Review Code 1- Verbalizes Understanding   Class Start Time 1155   Class Stop Time 1235   Class Time Calculation (min) 40 min      Core Videos: Exercise    Move It!  Clinical staff conducted group or individual video education with verbal and written material and guidebook.  Patient learns the recommended Pritikin exercise program. Exercise with the goal of living a long, healthy life. Some of the health benefits of exercise include controlled diabetes, healthier blood pressure levels, improved cholesterol levels, improved heart and lung capacity, improved sleep, and better body composition. Everyone should speak with their doctor before starting or changing an exercise routine.  Biomechanical Limitations Clinical staff conducted group or individual video  education with verbal and written material and guidebook.  Patient learns how biomechanical limitations can impact exercise and how we can mitigate and possibly overcome limitations to have an impactful and balanced exercise routine.  Body Composition Clinical staff conducted group or individual video education with verbal and written material and guidebook.  Patient learns that body composition (ratio of muscle mass to fat mass) is a key component to assessing overall fitness, rather than body weight alone. Increased fat mass, especially visceral belly fat, can put us  at increased risk for metabolic syndrome, type 2 diabetes, heart disease, and even death. It is recommended to combine diet and exercise (cardiovascular and resistance training) to improve your body composition. Seek guidance from your physician and exercise physiologist before implementing an exercise routine.  Exercise Action Plan Clinical staff conducted group or individual video education with verbal and written material and guidebook.  Patient learns the recommended strategies to achieve and enjoy long-term exercise adherence, including variety, self-motivation, self-efficacy, and positive decision making. Benefits of exercise include fitness, good health, weight management, more energy, better sleep, less stress, and overall well-being.  Medical   Heart Disease Risk Reduction Clinical staff conducted group or individual video education with verbal and written material and guidebook.  Patient learns our heart is our most vital organ as it circulates oxygen, nutrients, white blood cells, and hormones throughout the entire body, and carries waste away. Data supports a plant-based eating plan like the Pritikin Program for its effectiveness in slowing progression of and reversing heart disease. The video provides a number of recommendations to address heart disease.   Metabolic Syndrome and Belly Fat  Clinical staff conducted group  or individual video education with verbal and written material and guidebook.  Patient learns what metabolic syndrome is, how it leads to heart disease, and how one can reverse it and keep it from coming back. You have metabolic syndrome if you have 3 of the following 5 criteria: abdominal obesity, high blood pressure, high triglycerides, low HDL cholesterol, and high blood sugar.  Hypertension and Heart Disease Clinical staff conducted group or individual video education with verbal and written material and guidebook.  Patient learns that high blood pressure, or hypertension, is very common in the [place] . Hypertension is largely due to excessive salt intake, but other important risk factors include being overweight, physical inactivity, drinking too much alcohol, smoking, and not eating enough potassium from fruits and vegetables. High blood pressure is a leading risk factor for heart attack, stroke, congestive heart failure, dementia, kidney failure, and premature death. Long-term effects of excessive salt intake include stiffening of the arteries and thickening of heart muscle and organ damage. Recommendations include ways to reduce hypertension and the risk of heart disease.  Diseases of Our Time - Focusing on Diabetes Clinical staff conducted group or individual video education with verbal and written material and guidebook.  Patient learns why the best way to stop diseases of our time is prevention, through food and other lifestyle changes. Medicine (such as prescription pills and surgeries) is often only a Band-Aid on the problem, not a long-term solution. Most common diseases of our time include obesity, type 2 diabetes, hypertension, heart disease, and cancer. The Pritikin Program is recommended and has been proven to help reduce, reverse, and/or prevent the damaging effects of metabolic syndrome.  Nutrition   Overview of the Pritikin Eating Plan  Clinical staff conducted group or  individual video education with verbal and written material and guidebook.  Patient learns about the Pritikin Eating Plan for disease risk reduction. The Pritikin Eating Plan emphasizes a wide variety of unrefined, minimally-processed carbohydrates, like fruits, vegetables, whole grains, and legumes. Go, Caution, and Stop food choices are explained. Plant-based and lean animal proteins are emphasized. Rationale provided for low sodium intake for blood pressure control, low added sugars for blood sugar stabilization, and low added fats and oils for coronary artery disease risk reduction and weight management.  Calorie Density  Clinical staff conducted group or individual video education with verbal and written material and guidebook.  Patient learns about calorie density and how it impacts the Pritikin Eating Plan. Knowing the characteristics of the food you choose will help you decide whether those foods will lead to weight gain or weight loss, and whether  you want to consume more or less of them. Weight loss is usually a side effect of the Pritikin Eating Plan because of its focus on low calorie-dense foods.  Label Reading  Clinical staff conducted group or individual video education with verbal and written material and guidebook.  Patient learns about the Pritikin recommended label reading guidelines and corresponding recommendations regarding calorie density, added sugars, sodium content, and whole grains.  Dining Out - Part 1  Clinical staff conducted group or individual video education with verbal and written material and guidebook.  Patient learns that restaurant meals can be sabotaging because they can be so high in calories, fat, sodium, and/or sugar. Patient learns recommended strategies on how to positively address this and avoid unhealthy pitfalls.  Facts on Fats  Clinical staff conducted group or individual video education with verbal and written material and guidebook.  Patient learns  that lifestyle modifications can be just as effective, if not more so, as many medications for lowering your risk of heart disease. A Pritikin lifestyle can help to reduce your risk of inflammation and atherosclerosis (cholesterol build-up, or plaque, in the artery walls). Lifestyle interventions such as dietary choices and physical activity address the cause of atherosclerosis. A review of the types of fats and their impact on blood cholesterol levels, along with dietary recommendations to reduce fat intake is also included.  Nutrition Action Plan  Clinical staff conducted group or individual video education with verbal and written material and guidebook.  Patient learns how to incorporate Pritikin recommendations into their lifestyle. Recommendations include planning and keeping personal health goals in mind as an important part of their success.  Healthy Mind-Set    Healthy Minds, Bodies, Hearts  Clinical staff conducted group or individual video education with verbal and written material and guidebook.  Patient learns how to identify when they are stressed. Video will discuss the impact of that stress, as well as the many benefits of stress management. Patient will also be introduced to stress management techniques. The way we think, act, and feel has an impact on our hearts.  How Our Thoughts Can Heal Our Hearts  Clinical staff conducted group or individual video education with verbal and written material and guidebook.  Patient learns that negative thoughts can cause depression and anxiety. This can result in negative lifestyle behavior and serious health problems. Cognitive behavioral therapy is an effective method to help control our thoughts in order to change and improve our emotional outlook.  Additional Videos:  Exercise    Improving Performance  Clinical staff conducted group or individual video education with verbal and written material and guidebook.  Patient learns to use a  non-linear approach by alternating intensity levels and lengths of time spent exercising to help burn more calories and lose more body fat. Cardiovascular exercise helps improve heart health, metabolism, hormonal balance, blood sugar control, and recovery from fatigue. Resistance training improves strength, endurance, balance, coordination, reaction time, metabolism, and muscle mass. Flexibility exercise improves circulation, posture, and balance. Seek guidance from your physician and exercise physiologist before implementing an exercise routine and learn your capabilities and proper form for all exercise.  Introduction to Yoga  Clinical staff conducted group or individual video education with verbal and written material and guidebook.  Patient learns about yoga, a discipline of the coming together of mind, breath, and body. The benefits of yoga include improved flexibility, improved range of motion, better posture and core strength, increased lung function, weight loss, and positive self-image. Yoga's heart  health benefits include lowered blood pressure, healthier heart rate, decreased cholesterol and triglyceride levels, improved immune function, and reduced stress. Seek guidance from your physician and exercise physiologist before implementing an exercise routine and learn your capabilities and proper form for all exercise.  Medical   Aging: Enhancing Your Quality of Life  Clinical staff conducted group or individual video education with verbal and written material and guidebook.  Patient learns key strategies and recommendations to stay in good physical health and enhance quality of life, such as prevention strategies, having an advocate, securing a Health Care Proxy and Power of Attorney, and keeping a list of medications and system for tracking them. It also discusses how to avoid risk for bone loss.  Biology of Weight Control  Clinical staff conducted group or individual video education with  verbal and written material and guidebook.  Patient learns that weight gain occurs because we consume more calories than we burn (eating more, moving less). Even if your body weight is normal, you may have higher ratios of fat compared to muscle mass. Too much body fat puts you at increased risk for cardiovascular disease, heart attack, stroke, type 2 diabetes, and obesity-related cancers. In addition to exercise, following the Pritikin Eating Plan can help reduce your risk.  Decoding Lab Results  Clinical staff conducted group or individual video education with verbal and written material and guidebook.  Patient learns that lab test reflects one measurement whose values change over time and are influenced by many factors, including medication, stress, sleep, exercise, food, hydration, pre-existing medical conditions, and more. It is recommended to use the knowledge from this video to become more involved with your lab results and evaluate your numbers to speak with your doctor.   Diseases of Our Time - Overview  Clinical staff conducted group or individual video education with verbal and written material and guidebook.  Patient learns that according to the CDC, 50% to 70% of chronic diseases (such as obesity, type 2 diabetes, elevated lipids, hypertension, and heart disease) are avoidable through lifestyle improvements including healthier food choices, listening to satiety cues, and increased physical activity.  Sleep Disorders Clinical staff conducted group or individual video education with verbal and written material and guidebook.  Patient learns how good quality and duration of sleep are important to overall health and well-being. Patient also learns about sleep disorders and how they impact health along with recommendations to address them, including discussing with a physician.  Nutrition  Dining Out - Part 2 Clinical staff conducted group or individual video education with verbal and  written material and guidebook.  Patient learns how to plan ahead and communicate in order to maximize their dining experience in a healthy and nutritious manner. Included are recommended food choices based on the type of restaurant the patient is visiting.   Fueling a Banker conducted group or individual video education with verbal and written material and guidebook.  There is a strong connection between our food choices and our health. Diseases like obesity and type 2 diabetes are very prevalent and are in large-part due to lifestyle choices. The Pritikin Eating Plan provides plenty of food and hunger-curbing satisfaction. It is easy to follow, affordable, and helps reduce health risks.  Menu Workshop  Clinical staff conducted group or individual video education with verbal and written material and guidebook.  Patient learns that restaurant meals can sabotage health goals because they are often packed with calories, fat, sodium, and sugar. Recommendations include strategies  to plan ahead and to communicate with the manager, chef, or server to help order a healthier meal.  Planning Your Eating Strategy  Clinical staff conducted group or individual video education with verbal and written material and guidebook.  Patient learns about the Pritikin Eating Plan and its benefit of reducing the risk of disease. The Pritikin Eating Plan does not focus on calories. Instead, it emphasizes high-quality, nutrient-rich foods. By knowing the characteristics of the foods, we choose, we can determine their calorie density and make informed decisions.  Targeting Your Nutrition Priorities  Clinical staff conducted group or individual video education with verbal and written material and guidebook.  Patient learns that lifestyle habits have a tremendous impact on disease risk and progression. This video provides eating and physical activity recommendations based on your personal health goals,  such as reducing LDL cholesterol, losing weight, preventing or controlling type 2 diabetes, and reducing high blood pressure.  Vitamins and Minerals  Clinical staff conducted group or individual video education with verbal and written material and guidebook.  Patient learns different ways to obtain key vitamins and minerals, including through a recommended healthy diet. It is important to discuss all supplements you take with your doctor.   Healthy Mind-Set    Smoking Cessation  Clinical staff conducted group or individual video education with verbal and written material and guidebook.  Patient learns that cigarette smoking and tobacco addiction pose a serious health risk which affects millions of people. Stopping smoking will significantly reduce the risk of heart disease, lung disease, and many forms of cancer. Recommended strategies for quitting are covered, including working with your doctor to develop a successful plan.  Culinary   Becoming a Set designer conducted group or individual video education with verbal and written material and guidebook.  Patient learns that cooking at home can be healthy, cost-effective, quick, and puts them in control. Keys to cooking healthy recipes will include looking at your recipe, assessing your equipment needs, planning ahead, making it simple, choosing cost-effective seasonal ingredients, and limiting the use of added fats, salts, and sugars.  Cooking - Breakfast and Snacks  Clinical staff conducted group or individual video education with verbal and written material and guidebook.  Patient learns how important breakfast is to satiety and nutrition through the entire day. Recommendations include key foods to eat during breakfast to help stabilize blood sugar levels and to prevent overeating at meals later in the day. Planning ahead is also a key component.  Cooking - Educational psychologist conducted group or individual video  education with verbal and written material and guidebook.  Patient learns eating strategies to improve overall health, including an approach to cook more at home. Recommendations include thinking of animal protein as a side on your plate rather than center stage and focusing instead on lower calorie dense options like vegetables, fruits, whole grains, and plant-based proteins, such as beans. Making sauces in large quantities to freeze for later and leaving the skin on your vegetables are also recommended to maximize your experience.  Cooking - Healthy Salads and Dressing Clinical staff conducted group or individual video education with verbal and written material and guidebook.  Patient learns that vegetables, fruits, whole grains, and legumes are the foundations of the Pritikin Eating Plan. Recommendations include how to incorporate each of these in flavorful and healthy salads, and how to create homemade salad dressings. Proper handling of ingredients is also covered. Cooking - Soups and [employer] -  Soups and Desserts Clinical staff conducted group or individual video education with verbal and written material and guidebook.  Patient learns that Pritikin soups and desserts make for easy, nutritious, and delicious snacks and meal components that are low in sodium, fat, sugar, and calorie density, while high in vitamins, minerals, and filling fiber. Recommendations include simple and healthy ideas for soups and desserts.   Overview     The Pritikin Solution Program Overview Clinical staff conducted group or individual video education with verbal and written material and guidebook.  Patient learns that the results of the Pritikin Program have been documented in more than 100 articles published in peer-reviewed journals, and the benefits include reducing risk factors for (and, in some cases, even reversing) high cholesterol, high blood pressure, type 2 diabetes, obesity, and more! An overview of  the three key pillars of the Pritikin Program will be covered: eating well, doing regular exercise, and having a healthy mind-set.  WORKSHOPS  Exercise: Exercise Basics: Building Your Action Plan Clinical staff led group instruction and group discussion with PowerPoint presentation and patient guidebook. To enhance the learning environment the use of posters, models and videos may be added. At the conclusion of this workshop, patients will comprehend the difference between physical activity and exercise, as well as the benefits of incorporating both, into their routine. Patients will understand the FITT (Frequency, Intensity, Time, and Type) principle and how to use it to build an exercise action plan. In addition, safety concerns and other considerations for exercise and cardiac rehab will be addressed by the presenter. The purpose of this lesson is to promote a comprehensive and effective weekly exercise routine in order to improve patients' overall level of fitness.   Managing Heart Disease: Your Path to a Healthier Heart Clinical staff led group instruction and group discussion with PowerPoint presentation and patient guidebook. To enhance the learning environment the use of posters, models and videos may be added.At the conclusion of this workshop, patients will understand the anatomy and physiology of the heart. Additionally, they will understand how Pritikin's three pillars impact the risk factors, the progression, and the management of heart disease.  The purpose of this lesson is to provide a high-level overview of the heart, heart disease, and how the Pritikin lifestyle positively impacts risk factors.  Exercise Biomechanics Clinical staff led group instruction and group discussion with PowerPoint presentation and patient guidebook. To enhance the learning environment the use of posters, models and videos may be added. Patients will learn how the structural parts of their bodies  function and how these functions impact their daily activities, movement, and exercise. Patients will learn how to promote a neutral spine, learn how to manage pain, and identify ways to improve their physical movement in order to promote healthy living. The purpose of this lesson is to expose patients to common physical limitations that impact physical activity. Participants will learn practical ways to adapt and manage aches and pains, and to minimize their effect on regular exercise. Patients will learn how to maintain good posture while sitting, walking, and lifting.  Balance Training and Fall Prevention  Clinical staff led group instruction and group discussion with PowerPoint presentation and patient guidebook. To enhance the learning environment the use of posters, models and videos may be added. At the conclusion of this workshop, patients will understand the importance of their sensorimotor skills (vision, proprioception, and the vestibular system) in maintaining their ability to balance as they age. Patients will apply a variety of balancing  exercises that are appropriate for their current level of function. Patients will understand the common causes for poor balance, possible solutions to these problems, and ways to modify their physical environment in order to minimize their fall risk. The purpose of this lesson is to teach patients about the importance of maintaining balance as they age and ways to minimize their risk of falling.  WORKSHOPS   Nutrition:  Fueling a Ship broker led group instruction and group discussion with PowerPoint presentation and patient guidebook. To enhance the learning environment the use of posters, models and videos may be added. Patients will review the foundational principles of the Pritikin Eating Plan and understand what constitutes a serving size in each of the food groups. Patients will also learn Pritikin-friendly foods that are better  choices when away from home and review make-ahead meal and snack options. Calorie density will be reviewed and applied to three nutrition priorities: weight maintenance, weight loss, and weight gain. The purpose of this lesson is to reinforce (in a group setting) the key concepts around what patients are recommended to eat and how to apply these guidelines when away from home by planning and selecting Pritikin-friendly options. Patients will understand how calorie density may be adjusted for different weight management goals.  Mindful Eating  Clinical staff led group instruction and group discussion with PowerPoint presentation and patient guidebook. To enhance the learning environment the use of posters, models and videos may be added. Patients will briefly review the concepts of the Pritikin Eating Plan and the importance of low-calorie dense foods. The concept of mindful eating will be introduced as well as the importance of paying attention to internal hunger signals. Triggers for non-hunger eating and techniques for dealing with triggers will be explored. The purpose of this lesson is to provide patients with the opportunity to review the basic principles of the Pritikin Eating Plan, discuss the value of eating mindfully and how to measure internal cues of hunger and fullness using the Hunger Scale. Patients will also discuss reasons for non-hunger eating and learn strategies to use for controlling emotional eating.  Targeting Your Nutrition Priorities Clinical staff led group instruction and group discussion with PowerPoint presentation and patient guidebook. To enhance the learning environment the use of posters, models and videos may be added. Patients will learn how to determine their genetic susceptibility to disease by reviewing their family history. Patients will gain insight into the importance of diet as part of an overall healthy lifestyle in mitigating the impact of genetics and other  environmental insults. The purpose of this lesson is to provide patients with the opportunity to assess their personal nutrition priorities by looking at their family history, their own health history and current risk factors. Patients will also be able to discuss ways of prioritizing and modifying the Pritikin Eating Plan for their highest risk areas  Menu  Clinical staff led group instruction and group discussion with PowerPoint presentation and patient guidebook. To enhance the learning environment the use of posters, models and videos may be added. Using menus brought in from [employer], or printed from [employer], patients will apply the Pritikin dining out guidelines that were presented in the [employer] video. Patients will also be able to practice these guidelines in a variety of provided scenarios. The purpose of this lesson is to provide patients with the opportunity to practice hands-on learning of the Pritikin Dining Out guidelines with actual menus and practice scenarios.  Label Reading  Clinical staff led group instruction and group discussion with PowerPoint presentation and patient guidebook. To enhance the learning environment the use of posters, models and videos may be added. Patients will review and discuss the Pritikin label reading guidelines presented in Pritikin's Label Reading Educational series video. Using fool labels brought in from local grocery stores and markets, patients will apply the label reading guidelines and determine if the packaged food meet the Pritikin guidelines. The purpose of this lesson is to provide patients with the opportunity to review, discuss, and practice hands-on learning of the Pritikin Label Reading guidelines with actual packaged food labels. Cooking School  Pritikin's [employer] are designed to teach patients ways to prepare quick, simple, and affordable recipes at home. The importance of nutrition's role in  chronic disease risk reduction is reflected in its emphasis in the overall Pritikin program. By learning how to prepare essential core Pritikin Eating Plan recipes, patients will increase control over what they eat; be able to customize the flavor of foods without the use of added salt, sugar, or fat; and improve the quality of the food they consume. By learning a set of core recipes which are easily assembled, quickly prepared, and affordable, patients are more likely to prepare more healthy foods at home. These workshops focus on convenient breakfasts, simple entres, side dishes, and desserts which can be prepared with minimal effort and are consistent with nutrition recommendations for cardiovascular risk reduction. Cooking [employer] are taught by a Armed forces logistics/support/administrative officer (RD) who has been trained by the [employer]. The chef or RD has a clear understanding of the importance of minimizing - if not completely eliminating - added fat, sugar, and sodium in recipes. Throughout the series of Cooking School Workshop sessions, patients will learn about healthy ingredients and efficient methods of cooking to build confidence in their capability to prepare    Cooking School weekly topics:  Adding Flavor- Sodium-Free  Fast and Healthy Breakfasts  Powerhouse Plant-Based Proteins  Satisfying Salads and Dressings  Simple Sides and Sauces  International Cuisine-Spotlight on the [employer] Zones  Delicious Desserts  Savory Soups  [employer] - Meals in a Astronomer Appetizers and Snacks  Comforting Weekend Breakfasts  One-Pot Wonders   Fast Evening Meals  Landscape architect Your Pritikin Plate  WORKSHOPS   Healthy Mindset (Psychosocial):  Focused Goals, Sustainable Changes Clinical staff led group instruction and group discussion with PowerPoint presentation and patient guidebook. To enhance the learning environment the use of posters, models and videos may  be added. Patients will be able to apply effective goal setting strategies to establish at least one personal goal, and then take consistent, meaningful action toward that goal. They will learn to identify common barriers to achieving personal goals and develop strategies to overcome them. Patients will also gain an understanding of how our mind-set can impact our ability to achieve goals and the importance of cultivating a positive and growth-oriented mind-set. The purpose of this lesson is to provide patients with a deeper understanding of how to set and achieve personal goals, as well as the tools and strategies needed to overcome common obstacles which may arise along the way.  From Head to Heart: The Power of a Healthy Outlook  Clinical staff led group instruction and group discussion with PowerPoint presentation and patient guidebook. To enhance the learning environment the use of posters, models and videos may be added. Patients will be able to recognize and describe the  impact of emotions and mood on physical health. They will discover the importance of self-care and explore self-care practices which may work for them. Patients will also learn how to utilize the 4 C's to cultivate a healthier outlook and better manage stress and challenges. The purpose of this lesson is to demonstrate to patients how a healthy outlook is an essential part of maintaining good health, especially as they continue their cardiac rehab journey.  Healthy Sleep for a Healthy Heart Clinical staff led group instruction and group discussion with PowerPoint presentation and patient guidebook. To enhance the learning environment the use of posters, models and videos may be added. At the conclusion of this workshop, patients will be able to demonstrate knowledge of the importance of sleep to overall health, well-being, and quality of life. They will understand the symptoms of, and treatments for, common sleep disorders. Patients  will also be able to identify daytime and nighttime behaviors which impact sleep, and they will be able to apply these tools to help manage sleep-related challenges. The purpose of this lesson is to provide patients with a general overview of sleep and outline the importance of quality sleep. Patients will learn about a few of the most common sleep disorders. Patients will also be introduced to the concept of "sleep hygiene," and discover ways to self-manage certain sleeping problems through simple daily behavior changes. Finally, the workshop will motivate patients by clarifying the links between quality sleep and their goals of heart-healthy living.   Recognizing and Reducing Stress Clinical staff led group instruction and group discussion with PowerPoint presentation and patient guidebook. To enhance the learning environment the use of posters, models and videos may be added. At the conclusion of this workshop, patients will be able to understand the types of stress reactions, differentiate between acute and chronic stress, and recognize the impact that chronic stress has on their health. They will also be able to apply different coping mechanisms, such as reframing negative self-talk. Patients will have the opportunity to practice a variety of stress management techniques, such as deep abdominal breathing, progressive muscle relaxation, and/or guided imagery.  The purpose of this lesson is to educate patients on the role of stress in their lives and to provide healthy techniques for coping with it.  Learning Barriers/Preferences:  Learning Barriers/Preferences - 05/23/24 1414       Learning Barriers/Preferences   Learning Barriers Exercise Concerns   Some balance concerns   Learning Preferences Written Material;Pictoral          Education Topics:  Knowledge Questionnaire Score:  Knowledge Questionnaire Score - 05/23/24 1437       Knowledge Questionnaire Score   Pre Score 19/24           Core Components/Risk Factors/Patient Goals at Admission:  Personal Goals and Risk Factors at Admission - 05/23/24 1415       Core Components/Risk Factors/Patient Goals on Admission    Weight Management Yes;Weight Loss    Intervention Weight Management: Develop a combined nutrition and exercise program designed to reach desired caloric intake, while maintaining appropriate intake of nutrient and fiber, sodium and fats, and appropriate energy expenditure required for the weight goal.;Weight Management: Provide education and appropriate resources to help participant work on and attain dietary goals.;Weight Management/Obesity: Establish reasonable short term and long term weight goals.    Admit Weight 191 lb 12.8 oz (87 kg)    Expected Outcomes Short Term: Continue to assess and modify interventions until short term weight is achieved;Long  Term: Adherence to nutrition and physical activity/exercise program aimed toward attainment of established weight goal;Weight Loss: Understanding of general recommendations for a balanced deficit meal plan, which promotes 1-2 lb weight loss per week and includes a negative energy balance of [phone] kcal/d;Understanding recommendations for meals to include 15-35% energy as protein, 25-35% energy from fat, 35-60% energy from carbohydrates, less than 200mg  of dietary cholesterol, 20-35 gm of total fiber daily;Understanding of distribution of calorie intake throughout the day with the consumption of 4-5 meals/snacks    Diabetes Yes    Intervention Provide education about signs/symptoms and action to take for hypo/hyperglycemia.;Provide education about proper nutrition, including hydration, and aerobic/resistive exercise prescription along with prescribed medications to achieve blood glucose in normal ranges: Fasting glucose 65-99 mg/dL    Expected Outcomes Short Term: Participant verbalizes understanding of the signs/symptoms and immediate care of hyper/hypoglycemia,  proper foot care and importance of medication, aerobic/resistive exercise and nutrition plan for blood glucose control.;Long Term: Attainment of HbA1C < 7%.    Hypertension Yes    Intervention Provide education on lifestyle modifcations including regular physical activity/exercise, weight management, moderate sodium restriction and increased consumption of fresh fruit, vegetables, and low fat dairy, alcohol moderation, and smoking cessation.;Monitor prescription use compliance.    Expected Outcomes Short Term: Continued assessment and intervention until BP is < 140/10mm HG in hypertensive participants. < 130/47mm HG in hypertensive participants with diabetes, heart failure or chronic kidney disease.;Long Term: Maintenance of blood pressure at goal levels.    Lipids Yes    Intervention Provide education and support for participant on nutrition & aerobic/resistive exercise along with prescribed medications to achieve LDL 70mg , HDL >40mg .    Expected Outcomes Short Term: Participant states understanding of desired cholesterol values and is compliant with medications prescribed. Participant is following exercise prescription and nutrition guidelines.;Long Term: Cholesterol controlled with medications as prescribed, with individualized exercise RX and with personalized nutrition plan. Value goals: LDL < 70mg , HDL > 40 mg.    Personal Goal Other Yes    Personal Goal Short: Heart healthy diet Long term: Play pickleball, go back to kettlebell training weight loss    Intervention will continue to monitor pt and progress workloads as tolerated without sign or symptom    Expected Outcomes Pt will achieve his goals          Core Components/Risk Factors/Patient Goals Review:   Goals and Risk Factor Review     Row Name 05/29/24 1653 06/04/24 1406 07/09/24 1552 08/05/24 1758       Core Components/Risk Factors/Patient Goals Review   Personal Goals Review Weight Management/Obesity;Hypertension;Lipids;Diabetes  Weight Management/Obesity;Hypertension;Lipids;Diabetes Weight Management/Obesity;Hypertension;Lipids;Diabetes Weight Management/Obesity;Hypertension;Lipids;Diabetes    Review Bobbye started cardiac rehab on 05/29/24. Vital signs  and CBG's were stable. heart rate improved as metoprolol  was discontinued. patient continues to wear a zio patch Earmon started cardiac rehab on 05/29/24. Jarion is off to a good start to exericise.  Vital signs  and CBG's were stable. heart rate improved as metoprolol  was discontinued. Arcadio continues to wear a Zio patch monitor which he will turn in soon. Lorrin is doing well with exericise at cardiac rehab.  Vital signs have been stable. Ott had frequent ectopy on 07/08/24 which decreased when exercise was stopped and workload was decreased. Patient's new cardiologist. Dr Shlomo notified. Okay to proceed with exercise. Jeramy continues to do well with exericise at cardiac rehab.  Vital signs remain stable. Makya will complete cardiac rehab on 08/14/24.    Expected Outcomes Domanic will continue  to participate in cardiac rehab for exercise, nutrition and lifestyle modifications [place] will continue to participate in cardiac rehab for exercise, nutrition and lifestyle modifications Jermain will continue to participate in cardiac rehab for exercise, nutrition and lifestyle modifications Kaydon will continue to participate in cardiac rehab for exercise, nutrition and lifestyle modifications       Core Components/Risk Factors/Patient Goals at Discharge (Final Review):   Goals and Risk Factor Review - 08/05/24 1758       Core Components/Risk Factors/Patient Goals Review   Personal Goals Review Weight Management/Obesity;Hypertension;Lipids;Diabetes    Review Justen continues to do well with exericise at cardiac rehab.  Vital signs remain stable. Colonel will complete cardiac rehab on 08/14/24.    Expected Outcomes Hajime will continue to participate in cardiac rehab for exercise, nutrition and lifestyle  modifications          ITP Comments:  ITP Comments     Row Name 05/23/24 1028 05/29/24 1651 06/04/24 1359 07/09/24 1547 08/05/24 1755   ITP Comments Wilbert Bihari, MD: Medical Director.  Introduction to the [facility] / Intensive Cardiac Rehab.  Initial orientation packet reviewed with the patient. 30 Day ITP Review. Phelan started cardiac rehab on 05/29/24. Marciano did well with exercise. 30 Day ITP Review. Kemar started cardiac rehab on 05/29/24. Ebenezer is off to a good start  with exercise. 30 Day ITP Review. Jonathyn has good attendance and participation with exercise at cardiac rehab. 30 Day ITP Review. Amontae continues have  good attendance and participation with exercise at cardiac rehab. Armari will complete cardiac rehab on 08/14/24.      Comments: See ITP Comments

## 2024-08-06 ENCOUNTER — Ambulatory Visit: Attending: Cardiovascular Disease

## 2024-08-06 VITALS — Ht 68.0 in | Wt 194.6 lb

## 2024-08-06 DIAGNOSIS — Z79899 Other long term (current) drug therapy: Secondary | ICD-10-CM

## 2024-08-06 NOTE — Progress Notes (Unsigned)
   Nurse Visit   Date of Encounter: 08/06/2024 ID: DENNY MCCREE, DOB 1946-03-13, MRN [id number]  PCP:  Candise Aleene DEL, MD   [facility] Providers Cardiologist:  Wilbert Bihari, MD       Visit Details   VS:  Ht 5' 8 (1.727 m)   Wt 194 lb 9.6 oz (88.3 kg)   BMI 29.59 kg/m  , BMI Body mass index is 29.59 kg/m.  Wt Readings from Last 3 Encounters:  08/06/24 194 lb 9.6 oz (88.3 kg)  06/25/24 195 lb (88.5 kg)  06/11/24 190 lb 3.2 oz (86.3 kg)     Reason for visit: EKG  Performed today: Vitals, EKG, Provider consulted:Dr, Kate, and Education Changes (medications, testing, etc.) : None Length of Visit: 20 minutes    Medications Adjustments/Labs and Tests Ordered: Orders Placed This Encounter  Procedures   EKG 12-Lead   No orders of the defined types were placed in this encounter.    Signed, Rolin CHRISTELLA Qualia, RN  08/06/2024 2:23 PM

## 2024-08-07 ENCOUNTER — Encounter (HOSPITAL_COMMUNITY)
Admission: RE | Admit: 2024-08-07 | Discharge: 2024-08-07 | Disposition: A | Discharge status: Home / Self Care | Source: Ambulatory Visit | Attending: Cardiology | Admitting: Cardiology

## 2024-08-07 DIAGNOSIS — Z951 Presence of aortocoronary bypass graft: Secondary | ICD-10-CM

## 2024-08-07 DIAGNOSIS — I4891 Unspecified atrial fibrillation: Secondary | ICD-10-CM

## 2024-08-07 DIAGNOSIS — I213 ST elevation (STEMI) myocardial infarction of unspecified site: Secondary | ICD-10-CM

## 2024-08-08 ENCOUNTER — Encounter: Payer: Self-pay | Admitting: Cardiology

## 2024-08-09 ENCOUNTER — Encounter (HOSPITAL_COMMUNITY)
Admission: RE | Admit: 2024-08-09 | Discharge: 2024-08-09 | Disposition: A | Discharge status: Home / Self Care | Source: Ambulatory Visit | Attending: Cardiology

## 2024-08-09 DIAGNOSIS — I213 ST elevation (STEMI) myocardial infarction of unspecified site: Secondary | ICD-10-CM

## 2024-08-09 DIAGNOSIS — I4891 Unspecified atrial fibrillation: Secondary | ICD-10-CM

## 2024-08-09 DIAGNOSIS — Z951 Presence of aortocoronary bypass graft: Secondary | ICD-10-CM

## 2024-08-12 ENCOUNTER — Encounter (HOSPITAL_COMMUNITY)
Admission: RE | Admit: 2024-08-12 | Discharge: 2024-08-12 | Disposition: A | Discharge status: Home / Self Care | Source: Ambulatory Visit | Attending: Cardiology | Admitting: Cardiology

## 2024-08-12 DIAGNOSIS — I213 ST elevation (STEMI) myocardial infarction of unspecified site: Secondary | ICD-10-CM

## 2024-08-12 DIAGNOSIS — Z951 Presence of aortocoronary bypass graft: Secondary | ICD-10-CM

## 2024-08-12 DIAGNOSIS — I4891 Unspecified atrial fibrillation: Secondary | ICD-10-CM

## 2024-08-12 NOTE — Progress Notes (Signed)
 Discharge Progress Report  Patient Details  Name: Wayne Lamb MRN: [id number] Date of Birth: 1946/02/26 Referring Provider:   Flowsheet Row INTENSIVE CARDIAC REHAB ORIENT from 05/23/2024 in [facility]  Referring Provider Dr. Terrall (Dr. Wilbert Lamb covering)     Number of Visits: 108  Reason for Discharge:  Patient reached a stable level of exercise. Patient independent in their exercise. Patient has met program and personal goals.  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 Terrall (Dr. Wilbert Lamb covering)    Expected Discharge Date 08/14/24      Treadmill   MPH 1.8    Grade 0    Minutes 15    METs 2.38      Recumbant Bike   Level 1    RPM 50    Watts 10    Minutes 15    METs 1.9      Prescription Details   Frequency (times per week) 3    Duration Progress to 30 minutes of continuous aerobic without signs/symptoms of physical distress      Intensity   THRR 40-80% of Max Heartrate 57-114    Ratings of Perceived Exertion 11-13    Perceived Dyspnea 0-4      Progression   Progression Continue progressive overload as per policy without signs/symptoms or physical distress.      Resistance Training   Training Prescription Yes    Weight 3    Reps 10-15          Discharge Exercise Prescription (Final  Exercise Prescription Changes):  Exercise Prescription Changes - 08/14/24 1031       Response to Exercise   Blood Pressure (Admit) 108/68    Blood Pressure (Exit) 120/60    Heart Rate (Admit) 59 bpm    Heart Rate (Exercise) 117 bpm    Heart Rate (Exit) 74 bpm    Rating of Perceived Exertion (Exercise) 9    Symptoms None    Comments Wayne Lamb completed the cardiac rehab program today.    Duration Continue with 30 min of aerobic exercise without signs/symptoms of physical distress.    Intensity THRR unchanged      Progression   Progression Continue to progress workloads to maintain intensity without signs/symptoms of physical distress.    Average METs 3.5      Resistance Training   Training Prescription No    Weight Relaxation day, no weights.      Interval Training   Interval Training No      Treadmill   MPH 2.5    Grade 0    Minutes 15    METs 2.91      Recumbant Bike   Level 5    RPM 57    Watts 72    Minutes 15  METs 4.1      Home Exercise Plan   Plans to continue exercise at [facility] (comment)   Walking, equipment at gym   Frequency Add 2 additional days to program exercise sessions.    Initial Home Exercises Provided 06/17/24          Functional Capacity:  6 Minute Walk     Row Name 05/23/24 1418 08/09/24 1041       6 Minute Walk   Phase Initial Discharge    Distance 1275 feet 1452 feet    Distance % Change -- 13.88 %    Distance Feet Change -- 177 ft    Walk Time 6 minutes 6 minutes    # of Rest Breaks 0 0    MPH 2.41 2.75    METS 2.17 2.78    RPE 11 11    Perceived Dyspnea  0 0    VO2 Peak 7.58 9.75    Symptoms Yes (comment) No    Comments Patient remained asymptomatic, but had PVC's during walk test only, and irregular bradycardia throughout stay. --    Resting HR 45 bpm 65 bpm    Resting BP 104/68 132/76    Resting Oxygen Saturation  97 % --    Exercise Oxygen Saturation  during 6 min walk 100 % --    Max Ex. HR 81 bpm 111 bpm     Max Ex. BP 124/76 132/78    2 Minute Post BP 118/72 --       Psychological, QOL, Others - Outcomes: PHQ 2/9:    08/09/2024    2:19 PM 06/11/2024   10:06 AM 05/23/2024    2:43 PM 01/31/2024   11:20 AM 07/04/2023    2:43 PM  Depression screen PHQ 2/9  Decreased Interest 0 0 0 0 0  Down, Depressed, Hopeless 0 0 0 0 0  PHQ - 2 Score 0 0 0 0 0  Altered sleeping 1 1 1  0   Tired, decreased energy 1 0 1 0   Change in appetite 1 0 0 0   Feeling bad or failure about yourself  0 0 0 0   Trouble concentrating 0 0 0 0   Moving slowly or fidgety/restless 0 0 0 0   Suicidal thoughts 0 0 0 0   PHQ-9 Score 3 1 2  0   Difficult doing work/chores Not difficult at all Not difficult at all Not difficult at all Not difficult at all     Quality of Life:  Quality of Life - 08/09/24 1423       Quality of Life   Select Quality of Life      Quality of Life Scores   Health/Function Pre 27.37 %    Health/Function Post 25.37 %    Health/Function % Change -7.31 %    Socioeconomic Pre 30 %    Socioeconomic Post 25 %    Socioeconomic % Change  -16.67 %    Psych/Spiritual Pre 29.14 %    Psych/Spiritual Post 27.71 %    Psych/Spiritual % Change -4.91 %    Family Pre 28.5 %    Family Post 28.8 %    Family % Change 1.05 %    GLOBAL Pre 28.44 %    GLOBAL Post 26.28 %    GLOBAL % Change -7.59 %          Personal Goals: Goals established at orientation with interventions provided to work toward goal.  Personal Goals and Risk  Factors at Admission - 05/23/24 1415       Core Components/Risk Factors/Patient Goals on Admission    Weight Management Yes;Weight Loss    Intervention Weight Management: Develop a combined nutrition and exercise program designed to reach desired caloric intake, while maintaining appropriate intake of nutrient and fiber, sodium and fats, and appropriate energy expenditure required for the weight goal.;Weight Management: Provide education and appropriate resources to help  participant work on and attain dietary goals.;Weight Management/Obesity: Establish reasonable short term and long term weight goals.    Admit Weight 191 lb 12.8 oz (87 kg)    Expected Outcomes Short Term: Continue to assess and modify interventions until short term weight is achieved;Long Term: Adherence to nutrition and physical activity/exercise program aimed toward attainment of established weight goal;Weight Loss: Understanding of general recommendations for a balanced deficit meal plan, which promotes 1-2 lb weight loss per week and includes a negative energy balance of [phone] kcal/d;Understanding recommendations for meals to include 15-35% energy as protein, 25-35% energy from fat, 35-60% energy from carbohydrates, less than 200mg  of dietary cholesterol, 20-35 gm of total fiber daily;Understanding of distribution of calorie intake throughout the day with the consumption of 4-5 meals/snacks    Diabetes Yes    Intervention Provide education about signs/symptoms and action to take for hypo/hyperglycemia.;Provide education about proper nutrition, including hydration, and aerobic/resistive exercise prescription along with prescribed medications to achieve blood glucose in normal ranges: Fasting glucose 65-99 mg/dL    Expected Outcomes Short Term: Participant verbalizes understanding of the signs/symptoms and immediate care of hyper/hypoglycemia, proper foot care and importance of medication, aerobic/resistive exercise and nutrition plan for blood glucose control.;Long Term: Attainment of HbA1C < 7%.    Hypertension Yes    Intervention Provide education on lifestyle modifcations including regular physical activity/exercise, weight management, moderate sodium restriction and increased consumption of fresh fruit, vegetables, and low fat dairy, alcohol moderation, and smoking cessation.;Monitor prescription use compliance.    Expected Outcomes Short Term: Continued assessment and intervention until BP is <  140/69mm HG in hypertensive participants. < 130/76mm HG in hypertensive participants with diabetes, heart failure or chronic kidney disease.;Long Term: Maintenance of blood pressure at goal levels.    Lipids Yes    Intervention Provide education and support for participant on nutrition & aerobic/resistive exercise along with prescribed medications to achieve LDL 70mg , HDL >40mg .    Expected Outcomes Short Term: Participant states understanding of desired cholesterol values and is compliant with medications prescribed. Participant is following exercise prescription and nutrition guidelines.;Long Term: Cholesterol controlled with medications as prescribed, with individualized exercise RX and with personalized nutrition plan. Value goals: LDL < 70mg , HDL > 40 mg.    Personal Goal Other Yes    Personal Goal Short: Heart healthy diet Long term: Play pickleball, go back to kettlebell training weight loss    Intervention will continue to monitor pt and progress workloads as tolerated without sign or symptom    Expected Outcomes Pt will achieve his goals           Personal Goals Discharge:  Goals and Risk Factor Review     Row Name 05/29/24 1653 06/04/24 1406 07/09/24 1552 08/05/24 1758       Core Components/Risk Factors/Patient Goals Review   Personal Goals Review Weight Management/Obesity;Hypertension;Lipids;Diabetes Weight Management/Obesity;Hypertension;Lipids;Diabetes Weight Management/Obesity;Hypertension;Lipids;Diabetes Weight Management/Obesity;Hypertension;Lipids;Diabetes    Review Jerrit started cardiac rehab on 05/29/24. Vital signs  and CBG's were stable. heart rate improved as metoprolol  was discontinued. patient continues to wear a zio  patch Davis started cardiac rehab on 05/29/24. Kole is off to a good start to exericise.  Vital signs  and CBG's were stable. heart rate improved as metoprolol  was discontinued. Asier continues to wear a Zio patch monitor which he will turn in soon. Zohair is  doing well with exericise at cardiac rehab.  Vital signs have been stable. Clavin had frequent ectopy on 07/08/24 which decreased when exercise was stopped and workload was decreased. Patient's new cardiologist. Dr Shlomo notified. Okay to proceed with exercise. Edilberto continues to do well with exericise at cardiac rehab.  Vital signs remain stable. Bartt will complete cardiac rehab on 08/14/24.    Expected Outcomes Favian will continue to participate in cardiac rehab for exercise, nutrition and lifestyle modifications Kell will continue to participate in cardiac rehab for exercise, nutrition and lifestyle modifications Matther will continue to participate in cardiac rehab for exercise, nutrition and lifestyle modifications Rashon will continue to participate in cardiac rehab for exercise, nutrition and lifestyle modifications       Exercise Goals and Review:  Exercise Goals     Row Name 05/23/24 1427             Exercise Goals   Increase Physical Activity Yes       Intervention Provide advice, education, support and counseling about physical activity/exercise needs.;Develop an individualized exercise prescription for aerobic and resistive training based on initial evaluation findings, risk stratification, comorbidities and participant's personal goals.       Expected Outcomes Short Term: Attend rehab on a regular basis to increase amount of physical activity.;Long Term: Exercising regularly at least 3-5 days a week.;Long Term: Add in home exercise to make exercise part of routine and to increase amount of physical activity.       Increase Strength and Stamina Yes       Intervention Provide advice, education, support and counseling about physical activity/exercise needs.;Develop an individualized exercise prescription for aerobic and resistive training based on initial evaluation findings, risk stratification, comorbidities and participant's personal goals.       Expected Outcomes Short Term: Increase  workloads from initial exercise prescription for resistance, speed, and METs.;Short Term: Perform resistance training exercises routinely during rehab and add in resistance training at home;Long Term: Improve cardiorespiratory fitness, muscular endurance and strength as measured by increased METs and functional capacity ( )       Able to understand and use rate of perceived exertion (RPE) scale Yes       Intervention Provide education and explanation on how to use RPE scale       Expected Outcomes Short Term: Able to use RPE daily in rehab to express subjective intensity level;Long Term:  Able to use RPE to guide intensity level when exercising independently       Knowledge and understanding of Target Heart Rate Range (THRR) Yes       Intervention Provide education and explanation of THRR including how the numbers were predicted and where they are located for reference       Expected Outcomes Short Term: Able to state/look up THRR;Long Term: Able to use THRR to govern intensity when exercising independently;Short Term: Able to use daily as guideline for intensity in rehab       Understanding of Exercise Prescription Yes       Intervention Provide education, explanation, and written materials on patient's individual exercise prescription       Expected Outcomes Short Term: Able to explain program exercise prescription;Long Term: Able to explain  home exercise prescription to exercise independently          Exercise Goals Re-Evaluation:  Exercise Goals Re-Evaluation     Row Name 05/29/24 1134 06/17/24 1107 07/03/24 1052 07/31/24 1107 08/14/24 1134     Exercise Goal Re-Evaluation   Exercise Goals Review Increase Physical Activity;Increase Strength and Stamina;Able to understand and use rate of perceived exertion (RPE) scale Increase Physical Activity;Increase Strength and Stamina;Able to understand and use rate of perceived exertion (RPE) scale;Understanding of Exercise Prescription;Knowledge and  understanding of Target Heart Rate Range (THRR);Able to check pulse independently Increase Physical Activity;Increase Strength and Stamina;Able to understand and use rate of perceived exertion (RPE) scale;Understanding of Exercise Prescription;Knowledge and understanding of Target Heart Rate Range (THRR);Able to check pulse independently Increase Physical Activity;Increase Strength and Stamina;Able to understand and use rate of perceived exertion (RPE) scale;Understanding of Exercise Prescription;Knowledge and understanding of Target Heart Rate Range (THRR);Able to check pulse independently Increase Physical Activity;Increase Strength and Stamina;Able to understand and use rate of perceived exertion (RPE) scale;Understanding of Exercise Prescription;Knowledge and understanding of Target Heart Rate Range (THRR);Able to check pulse independently   Comments Claire was able to understand and use RPE scale appropriately. Reviewed exercise prescription with Norleen. He will walk at the gym and use the equipment there. He has a smart watch to monitor his pulse at home. Demetrie continues to make good progress with exercise. He will increase hand weights from 4 to 5 lbs next session. Coltan is walking daily at the gym and using the recumbent bike and treadmill. He will talk to his cardiologist regarding when he can return to the weight machines. Clois completed the cardiac rehab program and progressed well, achieving 3.6 METs with exercise. He's pleased with his participation in the progam and will continue walking and exercise at the gym.   Expected Outcomes Progress workloads as tolerated to help improve cardiorespiratory fitness. Progress workloads within THRR. Walk or exercise on the equipment at the gym at least 60 minutes to achieve 150 minutes of aerobic exercise/week. Continue to progress workloads to help increase strength and stamina. Emmanual will continue daily aerobic exercise. He will resume weight machines at the gym upon  clearance from his cardiologist. Brix will continues daily exercise to help maintain health and fitness gains.      Nutrition & Weight - Outcomes:  Pre Biometrics - 05/23/24 1428       Pre Biometrics   Height 5' 8 (1.727 m)    Weight 87 kg    Waist Circumference 39.75 inches    Hip Circumference 39 inches    Waist to Hip Ratio 1.02 %    BMI (Calculated) 29.17    Triceps Skinfold 11 mm    % Body Fat 27 %    Grip Strength 23 kg    Flexibility --   Unable to reach   Single Leg Stand 2 seconds          Post Biometrics - 08/14/24 1032        Post  Biometrics   Height 5' 8 (1.727 m)    Waist Circumference 41 inches    Hip Circumference 43 inches    Waist to Hip Ratio 0.95 %    Triceps Skinfold 15 mm    % Body Fat 29.2 %    Grip Strength 26 kg    Flexibility 0 in   Unable to reach   Single Leg Stand 7.57 seconds          Nutrition:  Nutrition Therapy & Goals - 06/28/24 1134       Nutrition Therapy   Diet Heart Healthy Diet    Drug/Food Interactions Statins/Certain Fruits      Personal Nutrition Goals   Nutrition Goal Patient to identify strategies for reducing cardiovascular risk by attending the [facility] education and nutrition series weekly.   goal in action.   Personal Goal #2 Patient to improve diet quality by using the plate method as a guide for meal planning to include lean protein/plant protein, fruits, vegetables, whole grains, nonfat dairy as part of a well-balanced diet.   goal in action.   Personal Goal #3 Patient to identify strategies for weight loss of 0.5-2.0# per week.   goal not met.   Comments Goals in action. Haytham has medical history of CABGx4, STEMI, Afib, DM2, TIA symptoms. LDL has improved to goal. J8r has improved to a prediabetic range. He has maintained his weight since starting with our program. Patient will benefit from participation in intensive cardiac rehab for nutrition, exercise, and lifestyle modification.      Intervention Plan    Intervention Prescribe, educate and counsel regarding individualized specific dietary modifications aiming towards targeted core components such as weight, hypertension, lipid management, diabetes, heart failure and other comorbidities.;Nutrition handout(s) given to patient.    Expected Outcomes Short Term Goal: Understand basic principles of dietary content, such as calories, fat, sodium, cholesterol and nutrients.;Long Term Goal: Adherence to prescribed nutrition plan.          Nutrition Discharge:  Nutrition Assessments - 08/09/24 1423       Rate Your Plate Scores   Pre Score 62          Education Questionnaire Score:  Knowledge Questionnaire Score - 08/09/24 1423       Knowledge Questionnaire Score   Pre Score 19/24    Post Score 24/24          Goals reviewed with patient; copy given to patient.Pt graduates from  Intensive/Traditional cardiac rehab program on 08/12/24 with completion of    32 exercise and  32 education sessions. Pt maintained good attendance and progressed nicely during their participation in rehab as evidenced by increased MET level. Nazeer increased his distance on his post exercise walk test by 177 feet.  Medication list reconciled. Repeat  PHQ score- 3 .  Pt has made significant lifestyle changes and should be commended for his success. Shjon achieved his goals during cardiac rehab.   Pt plans to continue exercise at the gym and playing pickelball. Mitchell says participating in cardiac rehab has been helpful. We are proud of Kyston's progress! Hadassah Elpidio Quan RN BSN

## 2024-08-14 ENCOUNTER — Encounter (HOSPITAL_COMMUNITY)
Admission: RE | Admit: 2024-08-14 | Discharge: 2024-08-14 | Disposition: A | Discharge status: Home / Self Care | Source: Ambulatory Visit | Attending: Cardiology | Admitting: Cardiology

## 2024-08-14 VITALS — BP 108/68 | HR 59 | Ht 68.0 in | Wt 197.5 lb

## 2024-08-14 DIAGNOSIS — I4891 Unspecified atrial fibrillation: Secondary | ICD-10-CM

## 2024-08-14 DIAGNOSIS — I213 ST elevation (STEMI) myocardial infarction of unspecified site: Secondary | ICD-10-CM

## 2024-08-14 DIAGNOSIS — Z951 Presence of aortocoronary bypass graft: Secondary | ICD-10-CM | POA: Diagnosis not present

## 2024-08-21 DIAGNOSIS — H40012 Open angle with borderline findings, low risk, left eye: Secondary | ICD-10-CM | POA: Diagnosis not present

## 2024-08-21 DIAGNOSIS — H2513 Age-related nuclear cataract, bilateral: Secondary | ICD-10-CM | POA: Diagnosis not present

## 2024-08-21 DIAGNOSIS — H10413 Chronic giant papillary conjunctivitis, bilateral: Secondary | ICD-10-CM | POA: Diagnosis not present

## 2024-08-21 DIAGNOSIS — H40033 Anatomical narrow angle, bilateral: Secondary | ICD-10-CM | POA: Diagnosis not present

## 2024-08-21 DIAGNOSIS — H43812 Vitreous degeneration, left eye: Secondary | ICD-10-CM | POA: Diagnosis not present

## 2024-08-21 DIAGNOSIS — H04123 Dry eye syndrome of bilateral lacrimal glands: Secondary | ICD-10-CM | POA: Diagnosis not present

## 2024-08-21 DIAGNOSIS — E119 Type 2 diabetes mellitus without complications: Secondary | ICD-10-CM | POA: Diagnosis not present

## 2024-08-21 DIAGNOSIS — H02834 Dermatochalasis of left upper eyelid: Secondary | ICD-10-CM | POA: Diagnosis not present

## 2024-08-21 DIAGNOSIS — H02831 Dermatochalasis of right upper eyelid: Secondary | ICD-10-CM | POA: Diagnosis not present

## 2024-08-21 LAB — HM DIABETES EYE EXAM

## 2024-08-28 ENCOUNTER — Other Ambulatory Visit: Payer: Self-pay | Admitting: Family Medicine

## 2024-09-10 ENCOUNTER — Encounter: Payer: Self-pay | Admitting: Pharmacist

## 2024-09-10 NOTE — Progress Notes (Signed)
 Pharmacy Quality Measure Review  This patient is appearing on a report for being at risk of failing the adherence measure for diabetes medications this calendar year.   Medication: metformin  500mg  Last fill date: 05/01/2024 for #180 or 90 day supply per pharmacy but at the time patient was only taking 1 tablet daily so was really a 180 day or 6 month supply.   At his appointment with Dr Candise on 06/11/2024 - metformin  was discontinued.  IMPRESSION AND PLAN: 1. diabetes without complication. POC Hba1c today is 6.1%. 03/09/2024 hemoglobin A1c 8.1%. Okay to stop metformin  (currently takes 500 mg once a day). Urine microalbumin/creatinine today..  Metformin  has been discontinued. No action needed at this time.   Madelin Ray, PharmD Clinical Pharmacist [facility]  Population Health [phone]

## 2024-09-13 ENCOUNTER — Ambulatory Visit: Payer: PPO | Admitting: Internal Medicine

## 2024-09-17 ENCOUNTER — Encounter: Payer: Self-pay | Admitting: Family Medicine

## 2024-09-17 ENCOUNTER — Encounter: Payer: Self-pay | Admitting: Cardiology

## 2024-09-17 ENCOUNTER — Ambulatory Visit: Admitting: Family Medicine

## 2024-09-17 VITALS — BP 113/69 | HR 53 | Temp 97.6°F | Ht 68.0 in | Wt 199.6 lb

## 2024-09-17 DIAGNOSIS — E782 Mixed hyperlipidemia: Secondary | ICD-10-CM | POA: Diagnosis not present

## 2024-09-17 DIAGNOSIS — E119 Type 2 diabetes mellitus without complications: Secondary | ICD-10-CM

## 2024-09-17 DIAGNOSIS — Z8673 Personal history of transient ischemic attack (TIA), and cerebral infarction without residual deficits: Secondary | ICD-10-CM | POA: Diagnosis not present

## 2024-09-17 DIAGNOSIS — Z87898 Personal history of other specified conditions: Secondary | ICD-10-CM

## 2024-09-17 DIAGNOSIS — I493 Ventricular premature depolarization: Secondary | ICD-10-CM

## 2024-09-17 DIAGNOSIS — I2510 Atherosclerotic heart disease of native coronary artery without angina pectoris: Secondary | ICD-10-CM | POA: Diagnosis not present

## 2024-09-17 DIAGNOSIS — I4719 Other supraventricular tachycardia: Secondary | ICD-10-CM

## 2024-09-17 DIAGNOSIS — I10 Essential (primary) hypertension: Secondary | ICD-10-CM

## 2024-09-17 LAB — POCT GLYCOSYLATED HEMOGLOBIN (HGB A1C)
HbA1c POC (<> result, manual entry): 6.7 % (ref 4.0–5.6)
HbA1c, POC (controlled diabetic range): 6.7 % (ref 0.0–7.0)
HbA1c, POC (prediabetic range): 6.7 % — AB (ref 5.7–6.4)
Hemoglobin A1C: 6.7 % — AB (ref 4.0–5.6)

## 2024-09-17 LAB — BASIC METABOLIC PANEL WITH GFR
BUN: 19 mg/dL (ref 6–23)
CO2: 29 meq/L (ref 19–32)
Calcium: 9.1 mg/dL (ref 8.4–10.5)
Chloride: 104 meq/L (ref 96–112)
Creatinine, Ser: 1.07 mg/dL (ref 0.40–1.50)
GFR: 66.67 mL/min (ref >60.00)
Glucose, Bld: 152 mg/dL — ABNORMAL HIGH (ref 70–99)
Potassium: 4.5 meq/L (ref 3.5–5.1)
Sodium: 140 meq/L (ref 135–145)

## 2024-09-17 NOTE — Progress Notes (Signed)
 OFFICE VISIT  09/17/2024  CC:  Chief Complaint  Patient presents with   Medical Management of Chronic Issues   Patient is a 78 y.o. male who presents for 61-month follow-up diabetes, hypertension, A/P as of last visit: 1. diabetes without complication. POC Hba1c today is 6.1%. 03/09/2024 hemoglobin A1c 8.1%. Okay to stop metformin  (currently takes 500 mg once a day). Urine microalbumin/creatinine today..   2.  Coronary artery disease with recent history of CABG. He is doing very well with cardiac rehab.   He got estab with a cardiologist with [facility] but has an appointment to establish with with Dr. Randine Bihari with [facility] cardiology instead  (06/25/2024).     3. Hx TIA. Carotid Dopplers when he was in the hospital for his CABG 2 months ago showed Review of hospital records from MI/CABG admission shows carotid Doppler 03/18/2024 that showed 60 to 79% stenosis of the right internal and left internal carotid artery.  He also had postoperative A-fib. Zio monitoring was just completed, results pending. He is on appropriate medical therapy: Atorvastatin  80 mg a day, aspirin 81 mg a day, and Plavix  75 mg a day. If outpatient monitoring normal then we will refer to vascular surgery.   4. gross hematuria. No further episodes. Okay to continue aspirin and Plavix . He has an appointment next month with [facility].   5.  Mixed hyperlipidemia. He was started on a atorvastatin  80 mg and Zetia  10 mg when he had his MI. I cannot find a lipid panel in [facility] records. Last cholesterol panel in our EMR was from 2020, LDL 152. Goal less than 70. Monitor lipid panel today. We discussed the possibility of needing PCSK9-I if LDL not at goal. He wants to avoid this medication if at all possible.  INTERIM HX: [place]opater feels great.  He is playing pickle ball 3 days a week again. Otherwise no activity.  He feels like he is eating a great diet.  2 weeks ZIO showed no A-fib so cardiology discontinued  his amiodarone .   PVCs were noted on repeat 3-day monitoring.  Echocardiogram showed EF 55 to 60%, some left atrial enlargement, otherwise normal.  With regard to his gross hematuria, urology diagnosed him with a bladder stone.  ROS as above, plus--> no fevers, no CP, no SOB, no wheezing, no cough, no dizziness, no HAs, no rashes, no melena/hematochezia.  No polyuria or polydipsia.  No myalgias or arthralgias.  No focal weakness, paresthesias, or tremors.  No acute vision or hearing abnormalities.  No dysuria or unusual/new urinary urgency or frequency.  No recent changes in lower legs. No n/v/d or abd pain.  No palpitations.    Past Medical History:  Diagnosis Date   BPH with obstruction/lower urinary tract symptoms    CAD (coronary artery disease), native coronary artery    S/P inferolateral STEMI/Vfib arrest and Cardiac cath revealed 90% ostial to proximal LAD, 80% mid LAD, 70% D1, 99% OM1 and occluded proximal RCA.  S/P CABG with SVG to PDA, SVG to OM1, SVG to D1 and LIMA to LAD.   Cataract    Diabetes mellitus without complication (HCC)    Dx 05/2023-->fasting gluc 200, Hba1c 7.7%   Diverticulosis    2014 colonoscopy   Elevated PSA    MRI prostate was done 2016 to eval persistently rising PSAs (no biopsy had been done): this showed no sign of prostate ca.  Stable 07/2017, 01/2018, 01/2019, 12/2019, 05/2020.   Encounter for hepatitis C virus screening test for high  risk patient    Done by [facility] physicians 01/18/17   Hypercholesterolemia    Leg DVT (deep venous thromboembolism), acute, left (HCC)    11/2022   Lumbar spondylosis    MRI L spine 2005: severe facet deg, SI deg   Obesity, Class II, BMI 35-39.9    OSA on CPAP    compliant   PAF (paroxysmal atrial fibrillation) (HCC)    Postop CABG 2025   Prostate nodule 2016   prostate nodule or ridge in R prostate lobe--w/u reassuring 2016    Pulmonary nodule    11/2022.  F/u imaging stable, no further imaging needed.   Rhinitis    Saddle  pulmonary embolus (HCC)    12/20/22 hosp   Sensorineural hearing loss (SNHL) of both ears    [facility] audiology 06/2024-->amplification recommended   STEMI (ST elevation myocardial infarction) (HCC)    Vfib arretst 02/2024-->CABG   Tubular adenoma of colon 2014   recommend repeat colonoscopy 2019    Past Surgical History:  Procedure Laterality Date   COLONOSCOPY     02/14/13 adenoma x 1 ([facility]).  +diverticulosis and int hem   CORONARY ARTERY BYPASS GRAFT     03/10/24 (4 vessel)   FRACTURE SURGERY     INGUINAL HERNIA REPAIR Left    right clavicle fracture     pin inserted (Dr. Deward)   TONSILLECTOMY     TRANSTHORACIC ECHOCARDIOGRAM     12/22/22 normal except grd I DD   ZIO monitor     05/2024 essentially normal    Outpatient Medications Prior to Visit  Medication Sig Dispense Refill   acetaminophen  (TYLENOL ) 500 MG tablet Take 2 tablets by mouth every 6 (six) hours as needed for mild pain (pain score 1-3).     ascorbic acid (VITAMIN C) 500 MG tablet Take 500 mg by mouth daily.     aspirin 81 MG chewable tablet Chew 81 mg by mouth daily.     atorvastatin  (LIPITOR) 80 MG tablet Take 1 tablet (80 mg total) by mouth at bedtime. 90 tablet 3   clopidogrel  (PLAVIX ) 75 MG tablet TAKE 1 TABLET BY MOUTH EVERY DAY 30 tablet 1   co-enzyme Q-10 30 MG capsule Take 100 mg by mouth daily.     ezetimibe  (ZETIA ) 10 MG tablet Take 1 tablet (10 mg total) by mouth at bedtime. 90 tablet 3   losartan  (COZAAR ) 25 MG tablet 1 tab po bid 180 tablet 3   metoprolol  succinate (TOPROL  XL) 25 MG 24 hr tablet Take 0.5 tablets (12.5 mg total) by mouth daily. 45 tablet 3   omeprazole  (PRILOSEC) 20 MG capsule Take 1 capsule (20 mg total) by mouth every morning. 90 capsule 3   zinc gluconate 50 MG tablet Take 50 mg by mouth daily.     No facility-administered medications prior to visit.    No Known Allergies  Review of Systems As per HPI  PE:    09/17/2024    9:52 AM 08/14/2024   10:32 AM 08/06/2024    2:08 PM   Vitals with BMI  Height 5' 8 5' 8 5' 8  Weight 199 lbs 10 oz 197 lbs 9 oz 194 lbs 10 oz  BMI 30.36 30.04 29.6  Systolic 113 108   Diastolic 69 68   Pulse 53 59      Physical Exam  Gen: Alert, well appearing.  Patient is oriented to person, place, time, and situation. AFFECT: pleasant, lucid thought and speech. CV: RRR, no m/r/g.  LUNGS: CTA bilat, nonlabored resps, good aeration in all lung fields. EXT: no clubbing or cyanosis.  no edema.    LABS:  Last CBC Lab Results  Component Value Date   WBC 5.8 04/03/2024   HGB 12.7 (L) 04/03/2024   HCT 39.1 04/03/2024   MCV 90.3 04/03/2024   MCH 30.0 12/23/2022   RDW 15.3 04/03/2024   PLT 377.0 04/03/2024   Last metabolic panel Lab Results  Component Value Date   GLUCOSE 164 (H) 04/24/2024   NA 138 04/24/2024   K 4.3 04/24/2024   CL 102 04/24/2024   CO2 28 04/24/2024   BUN 17 04/24/2024   CREATININE 1.08 04/24/2024   GFR 66.12 04/24/2024   CALCIUM  9.6 04/24/2024   PROT 7.2 04/03/2024   ALBUMIN 4.3 04/03/2024   BILITOT 0.8 04/03/2024   ALKPHOS 200 (H) 04/03/2024   AST 20 04/03/2024   ALT 17 04/03/2024   ANIONGAP 11 12/20/2022   Last lipids Lab Results  Component Value Date   CHOL 118 06/11/2024   HDL 49.20 06/11/2024   LDLCALC 47 06/11/2024   LDLDIRECT 159.0 08/16/2017   TRIG 106.0 06/11/2024   CHOLHDL 2 06/11/2024   Last hemoglobin A1c Lab Results  Component Value Date   HGBA1C 6.1 (A) 06/11/2024   HGBA1C 6.1 06/11/2024   HGBA1C 6.1 06/11/2024   HGBA1C 6.1 06/11/2024   Last thyroid  functions Lab Results  Component Value Date   TSH 5.45 04/03/2024   IMPRESSION AND PLAN:  1. diabetes without complication. POC Hba1c today is 6.7%, up from 6.1% last check. We will hold off on restarting metformin .  If A1c continues to rise at next check in 3 months then we will restart it. Monitor renal function today.   2.  Coronary artery disease with recent history of CABG. He finished cardiac rehab.  He  feels very well. He is established with Dr. Randine Bihari with [facility]. Not sure how long they will keep him on dual antiplatelet therapy (?  Indefinitely).   3. Hx TIA. Carotid Dopplers when he was in the hospital for his CABG 2 months ago showed Review of hospital records from MI/CABG admission shows carotid Doppler 03/18/2024 that showed 60 to 79% stenosis of the right internal and left internal carotid artery.  He also had postoperative A-fib. ZIO monitoring was essentially normal about 3 months ago. He is on appropriate medical therapy: Atorvastatin  80 mg a day, aspirin 81 mg a day, and Plavix  75 mg a day. Our plan had initially been -->if outpatient monitoring normal then we will refer to vascular surgery.  He wants to put this off at this time and I suspect what vascular surgery would do is continue with medical management at this point.     4.  History of gross hematuria. Urology discovered bladder stones. No intervention was done.  He has no further blood in his urine and has no voiding pain.  The plan is observation.   5.  Mixed hyperlipidemia. He was started on a atorvastatin  80 mg and Zetia  10 mg when he had his MI. LDL was 47 approximately 3 months ago. Repeat lipid panel in 3 months.  6.  Hypertension, well-controlled on losartan  25 mg twice daily, Toprol -XL An After Visit Summary was printed and given to the patient.  I personally spent a total of 42 minutes in the care of the patient today including preparing to see the patient, getting/reviewing separately obtained history, performing a medically appropriate exam/evaluation, counseling and educating, placing orders,  documenting clinical information in the EHR, and communicating results.  FOLLOW UP: No follow-ups on file.  Signed:  Gerlene Hockey, MD           09/17/2024

## 2024-09-18 ENCOUNTER — Ambulatory Visit: Attending: Cardiology

## 2024-09-18 ENCOUNTER — Ambulatory Visit: Payer: Self-pay | Admitting: Family Medicine

## 2024-09-18 DIAGNOSIS — I493 Ventricular premature depolarization: Secondary | ICD-10-CM

## 2024-09-18 DIAGNOSIS — I4719 Other supraventricular tachycardia: Secondary | ICD-10-CM

## 2024-09-18 NOTE — Progress Notes (Unsigned)
 Enrolled patient for a 3 day Zio XT monitor to be mailed to patients home

## 2024-09-24 ENCOUNTER — Encounter: Payer: Self-pay | Admitting: Family Medicine

## 2024-09-24 NOTE — Telephone Encounter (Signed)
 Form started and placed on PCP desk to review and sign, if appropriate.

## 2024-10-02 DIAGNOSIS — I493 Ventricular premature depolarization: Secondary | ICD-10-CM | POA: Diagnosis not present

## 2024-10-02 DIAGNOSIS — I4719 Other supraventricular tachycardia: Secondary | ICD-10-CM | POA: Diagnosis not present

## 2024-10-03 ENCOUNTER — Encounter: Payer: Self-pay | Admitting: Family Medicine

## 2024-10-04 NOTE — Telephone Encounter (Signed)
 No further action needed at this time.

## 2024-10-07 ENCOUNTER — Ambulatory Visit: Payer: Self-pay | Admitting: Cardiology

## 2024-10-07 DIAGNOSIS — I4719 Other supraventricular tachycardia: Secondary | ICD-10-CM

## 2024-10-07 DIAGNOSIS — I493 Ventricular premature depolarization: Secondary | ICD-10-CM | POA: Diagnosis not present

## 2024-10-10 NOTE — Telephone Encounter (Signed)
 Call to patient to discuss heart monitor results. No answer, left message per DPR asking patient to call our office. MC also sent.

## 2024-10-10 NOTE — Telephone Encounter (Signed)
-----   Message from Wilbert Bihari sent at 10/07/2024 11:28 AM EST ----- Heart monitor showed no episodes of afib off Amio.  He did have frequent PVCs which are extra heart beats from bottom of heart which are benign in the setting of normal LVF.  There were a few  episodes of NSVT up to 9 beats. Increase Toprol  to 25mg  daily.  ----- Message ----- From: Bihari Wilbert SAUNDERS, MD Sent: 10/07/2024  11:16 AM EST To: Wilbert SAUNDERS Bihari, MD

## 2024-10-11 ENCOUNTER — Ambulatory Visit

## 2024-10-11 VITALS — BP 122/70 | HR 56 | Temp 97.6°F | Ht 68.0 in | Wt 202.0 lb

## 2024-10-11 DIAGNOSIS — E66811 Obesity, class 1: Secondary | ICD-10-CM

## 2024-10-11 DIAGNOSIS — G4733 Obstructive sleep apnea (adult) (pediatric): Secondary | ICD-10-CM | POA: Diagnosis not present

## 2024-10-11 DIAGNOSIS — I2692 Saddle embolus of pulmonary artery without acute cor pulmonale: Secondary | ICD-10-CM

## 2024-10-11 DIAGNOSIS — I469 Cardiac arrest, cause unspecified: Secondary | ICD-10-CM | POA: Diagnosis not present

## 2024-10-11 NOTE — Patient Instructions (Signed)
  VISIT SUMMARY: You came in today for a follow-up visit regarding your severe sleep apnea. You have been using CPAP therapy for nearly twenty years, which has effectively managed your symptoms. We also reviewed your history of osteoarthritis and discussed your current pain management regimen.  YOUR PLAN: -OBSTRUCTIVE SLEEP APNEA ON CPAP THERAPY: Obstructive sleep apnea is a condition where your airway becomes blocked during sleep, causing breathing pauses. Your CPAP therapy is working well, with a residual AHI of 1.2, and you are experiencing good sleep quality overall. You should continue using your current CPAP machine (Airsense 10) and nasal pillow mask. A new CPAP machine will be prescribed in 2026.  INSTRUCTIONS: Please continue with your current CPAP therapy and pain management regimen. A new CPAP machine will be prescribed in 2026. If you experience any new symptoms or changes in your condition, please schedule an appointment.                      Contains text generated by Abridge.                                 Contains text generated by Abridge.

## 2024-10-11 NOTE — Progress Notes (Signed)
 Pulmonology Office Visit   Subjective:  Patient ID: Wayne Lamb, male    DOB: 01-18-1946  MRN: [id number]  Referred by: Candise Aleene DEL, MD  CC:  Chief Complaint  Patient presents with   Consult    OSA- Yearly checkup. CPAP doing well.     HPI KIARA KEEP is a 78 y.o. male with PE/DVT for 7 months xarelto, CAD, OSA, GERD, CAC s/p CABG 02/2024,  obesity class I presents as follow-up for OSA management.  Discussed the use of AI scribe software for clinical note transcription with the patient, who gave verbal consent to proceed.  History of Present Illness   RASHAUD YBARBO is a 78 year old male with severe sleep apnea who presents for follow-up.  He has a long-standing history of severe sleep apnea and has been using CPAP therapy for nearly twenty years. Initially, he sought treatment due to excessive daytime sleepiness and near-miss incidents of falling asleep while driving. Since initiating CPAP therapy, these symptoms have resolved. His sleep schedule typically involves going to bed around 11 PM and waking between 7:30 and 9 AM, though he occasionally experiences early morning awakenings at 4 or 5 AM, after which he naps for a couple more hours. Overall, he feels well-rested.  In April 2025, he underwent a quadruple bypass surgery following a myocardial infarction that occurred while playing pickleball. He was resuscitated by a cardiologist present at the scene and underwent emergency surgery the next day. He completed both inpatient and cardiac rehabilitation post-surgery.  In January 2024, he experienced a pulmonary embolism, presenting with shortness of breath and leg swelling. He was treated with heparin  and Eliquis  for seven months. He reports that a follow-up CT scan was performed because of a nodule, and his primary care doctor told him some nodules were better and some were stable. He is no longer on blood thinners.  He has a history of arthritis affecting his knees,  wrists, neck, and spine, causing persistent but non-debilitating pain. He does not take medication for this condition.  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 He retired in 2012 after implementing the Epic system at his workplace.       PAP download compliance data: [facility]/Airview: resmed 10.  Pressure: 5-15 cm H2o.  Hours of usage: 7 hr Days used >4hr: 29/30 Leak: no sign leak AHI:1.2 95% 8.9 cmH2o max 10.   PRIOR TESTS and IMAGING: PSG/HSAT: NPSG 10/16/03- AHI (RDI) 51/ hr, desat to 83%, body weight 220 lbs   ECHO Jan 2025: G1 DD. RV size normal.   CT chest Jan 2024: saddle PE.   ESS 4.      No data to display          Allergies: Patient has no known allergies.  Current Outpatient Medications:    acetaminophen  (TYLENOL ) 500 MG tablet, Take 2 tablets by mouth every 6 (six) hours as needed for mild pain (pain score 1-3)., Disp: , Rfl:    ascorbic acid (VITAMIN C) 500 MG tablet, Take 500 mg by mouth daily., Disp: , Rfl:    aspirin 81 MG chewable tablet, Chew 81 mg by mouth daily., Disp: , Rfl:    atorvastatin  (LIPITOR) 80 MG tablet, Take 1 tablet (80 mg total) by mouth at bedtime., Disp: 90 tablet, Rfl: 3   clopidogrel  (PLAVIX ) 75 MG tablet, TAKE 1 TABLET BY MOUTH EVERY DAY, Disp: 30 tablet, Rfl: 1   co-enzyme Q-10 30  MG capsule, Take 100 mg by mouth daily., Disp: , Rfl:    ezetimibe  (ZETIA ) 10 MG tablet, Take 1 tablet (10 mg total) by mouth at bedtime., Disp: 90 tablet, Rfl: 3   losartan  (COZAAR ) 25 MG tablet, 1 tab po bid, Disp: 180 tablet, Rfl: 3   metoprolol  succinate (TOPROL  XL) 25 MG 24 hr tablet, Take 0.5 tablets (12.5 mg total) by mouth daily., Disp: 45 tablet, Rfl: 3   omeprazole  (PRILOSEC) 20 MG capsule, Take 1 capsule (20 mg total) by mouth every morning., Disp: 90 capsule, Rfl: 3   zinc gluconate 50 MG tablet, Take 50 mg by mouth daily., Disp: , Rfl:  Past Medical History:  Diagnosis Date   BPH with  obstruction/lower urinary tract symptoms    CAD (coronary artery disease), native coronary artery    S/P inferolateral STEMI/Vfib arrest and Cardiac cath revealed 90% ostial to proximal LAD, 80% mid LAD, 70% D1, 99% OM1 and occluded proximal RCA.  S/P CABG with SVG to PDA, SVG to OM1, SVG to D1 and LIMA to LAD.   Cataract    Diabetes mellitus without complication (HCC)    Dx 05/2023-->fasting gluc 200, Hba1c 7.7%   Diverticulosis    2014 colonoscopy   Elevated PSA    MRI prostate was done 2016 to eval persistently rising PSAs (no biopsy had been done): this showed no sign of prostate ca.  Stable 07/2017, 01/2018, 01/2019, 12/2019, 05/2020.   Encounter for hepatitis C virus screening test for high risk patient    Done by [facility] physicians 01/18/17   Hypercholesterolemia    Leg DVT (deep venous thromboembolism), acute, left (HCC)    11/2022   Lumbar spondylosis    MRI L spine 2005: severe facet deg, SI deg   Obesity, Class II, BMI 35-39.9    OSA on CPAP    compliant   PAF (paroxysmal atrial fibrillation) (HCC)    Postop CABG 2025   Prostate nodule 2016   prostate nodule or ridge in R prostate lobe--w/u reassuring 2016    Pulmonary nodule    11/2022.  F/u imaging stable, no further imaging needed.   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   Sensorineural hearing loss (SNHL) of both ears    [facility] audiology 06/2024-->amplification recommended   STEMI (ST elevation myocardial infarction) (HCC)    Vfib arretst 02/2024-->CABG   Tubular adenoma of colon 2014   recommend repeat colonoscopy 2019   Past Surgical History:  Procedure Laterality Date   COLONOSCOPY     02/14/13 adenoma x 1 ([facility]).  +diverticulosis and int hem   CORONARY ARTERY BYPASS GRAFT     03/10/24 (4 vessel)   FRACTURE SURGERY     INGUINAL HERNIA REPAIR Left    right clavicle fracture     pin inserted (Dr. Deward)   TONSILLECTOMY     TRANSTHORACIC ECHOCARDIOGRAM     12/22/22 normal except grd I DD   ZIO  monitor     05/2024 essentially normal   Family History  Problem Relation Age of Onset   Alcohol abuse Father    Early death Father 76       unknown cause of death   Heart disease Mother    Heart disease Sister    Learning disabilities Brother        Dyslexia   Social History   Socioeconomic History   Marital status: Married    Spouse name: Not on file  Number of children: Not on file   Years of education: Not on file   Highest education level: Bachelor's degree (e.g., BA, AB, BS)  Occupational History   Not on file  Tobacco Use   Smoking status: Former    Current packs/day: 0.00    Average packs/day: 1 pack/day for 15.0 years (15.0 ttl pk-yrs)    Types: Cigarettes    Start date: 04/19/1965    Quit date: 04/19/1980    Years since quitting: 44.5   Smokeless tobacco: Never  Vaping Use   Vaping status: Never Used  Substance and Sexual Activity   Alcohol use: Yes    Alcohol/week: 1.0 standard drink of alcohol    Types: 1 Shots of liquor per week    Comment: occ   Drug use: No   Sexual activity: Not Currently  Other Topics Concern   Not on file  Social History Narrative   Married, 1 son who is an pensions consultant in [place].   HE GREW UP IN [place] ---WENT TO [employer]!   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   Tob: former smoker, 15 pack-yr hx, quit 1970.   Alc: no   Exercise: 2-3 days per week, CV and wt training.   Social Drivers of Corporate Investment Banker Strain: Low Risk  (09/14/2024)   Overall Financial Resource Strain (CARDIA)    Difficulty of Paying Living Expenses: Not hard at all  Food Insecurity: No Food Insecurity (09/14/2024)   Hunger Vital Sign    Worried About Running Out of Food in the Last Year: Never true    Ran Out of Food in the Last Year: Never true  Transportation Needs: No Transportation Needs (09/14/2024)   PRAPARE - Administrator, Civil Service (Medical): No    Lack of Transportation (Non-Medical): No   Physical Activity: Sufficiently Active (09/14/2024)   Exercise Vital Sign    Days of Exercise per Week: 3 days    Minutes of Exercise per Session: 120 min  Stress: No Stress Concern Present (09/14/2024)   [employer] of Occupational Health - Occupational Stress Questionnaire    Feeling of Stress: Not at all  Social Connections: Socially Integrated (09/14/2024)   Social Connection and Isolation Panel    Frequency of Communication with Friends and Family: Twice a week    Frequency of Social Gatherings with Friends and Family: Three times a week    Attends Religious Services: More than 4 times per year    Active Member of Clubs or Organizations: Yes    Attends Banker Meetings: 1 to 4 times per year    Marital Status: Married  Catering Manager Violence: Not At Risk (01/31/2024)   Humiliation, Afraid, Rape, and Kick questionnaire    Fear of Current or Ex-Partner: No    Emotionally Abused: No    Physically Abused: No    Sexually Abused: No       Objective:  BP 122/70   Pulse (!) 56   Temp 97.6 F (36.4 C)   Ht 5' 8 (1.727 m)   Wt 202 lb (91.6 kg)   SpO2 98%   BMI 30.71 kg/m  BMI Readings from Last 3 Encounters:  10/11/24 30.71 kg/m  09/17/24 30.35 kg/m  08/14/24 30.03 kg/m    Physical Exam: Physical Exam   ENT: Normal mucosa. No hypertrophy of inferior turbinates. Tonsils are normal sized. Modified Mallampati score is normal. PULMONARY: Lungs clear to auscultation bilaterally,  no adventitious breath sounds. CARDIOVASCULAR: Regular rate and rhythm, S1 S2 normal, no murmurs. ABDOMEN: Abdomen soft, nontender. Bowel sounds are normal. EXTREMITIES: No peripheral edema noted.       Diagnostic Review:  Last metabolic panel Lab Results  Component Value Date   GLUCOSE 152 (H) 09/17/2024   NA 140 09/17/2024   K 4.5 09/17/2024   CL 104 09/17/2024   CO2 29 09/17/2024   BUN 19 09/17/2024   CREATININE 1.07 09/17/2024   GFR 66.67 09/17/2024   CALCIUM   9.1 09/17/2024   PROT 7.2 04/03/2024   ALBUMIN 4.3 04/03/2024   BILITOT 0.8 04/03/2024   ALKPHOS 200 (H) 04/03/2024   AST 20 04/03/2024   ALT 17 04/03/2024   ANIONGAP 11 12/20/2022         Assessment & Plan:  Assessment and Plan    Obstructive sleep apnea on CPAP therapy Severe obstructive sleep apnea managed with CPAP. Good compliance, residual AHI 1.2. Occasional early morning awakenings, overall good sleep quality. Current CPAP (Airsense 10) effective.  - Continue CPAP therapy with Airsense 10.  - Prescribe new CPAP machine in 2026. - Continue using nasal pillow mask.  Saddle PE: - s/p AC for 7 months.  - ECHO w/o RV dysfunction. Follows Cardiology. Continue follow up.   CAC due to CAD s/p CABG: - no neurological deficits.    Return in about 1 year (around 10/11/2025).   I personally spent a total of 25 minutes in the care of the patient today including preparing to see the patient, getting/reviewing separately obtained history, performing a medically appropriate exam/evaluation, counseling and educating, placing orders, documenting clinical information in the EHR, independently interpreting results, and communicating results.   Ignazio Kincaid, MD

## 2024-10-11 NOTE — Telephone Encounter (Signed)
 Patient returned RN's call regarding results.

## 2024-10-17 MED ORDER — METOPROLOL SUCCINATE ER 25 MG PO TB24: 25.0000 mg | ORAL_TABLET | Freq: Every day | ORAL | 3 refills | Status: AC

## 2024-10-17 NOTE — Addendum Note (Signed)
 Addended by: JANIT GENI CROME on: 10/17/2024 05:17 PM   Modules accepted: Orders

## 2024-10-17 NOTE — Telephone Encounter (Signed)
 MC with results of heart monitor seen by patient, also called to discuss dose increase of toprol . No answer, left detailed message explaining I would send new script for increased dose of toprol  to pharmacy on records. Asked patient to call our office if any questions.

## 2024-10-17 NOTE — Telephone Encounter (Signed)
-----   Message from Nurse Roxie PARAS sent at 10/17/2024 12:10 PM EST -----  ----- Message ----- From: Tanda Herma NOVAK Sent: 10/11/2024  11:31 AM EST To: Lurena Hershal [place] Triage  ----- Message from Herma NOVAK Tanda sent at 10/11/2024 11:31 AM EST -----

## 2024-10-25 ENCOUNTER — Other Ambulatory Visit: Payer: Self-pay | Admitting: Family Medicine

## 2024-10-29 DIAGNOSIS — N401 Enlarged prostate with lower urinary tract symptoms: Secondary | ICD-10-CM | POA: Diagnosis not present

## 2024-10-29 DIAGNOSIS — N210 Calculus in bladder: Secondary | ICD-10-CM | POA: Diagnosis not present

## 2024-10-29 DIAGNOSIS — N3941 Urge incontinence: Secondary | ICD-10-CM | POA: Diagnosis not present

## 2024-10-29 DIAGNOSIS — R351 Nocturia: Secondary | ICD-10-CM | POA: Diagnosis not present

## 2024-11-08 ENCOUNTER — Encounter: Payer: Self-pay | Admitting: Cardiology

## 2024-12-18 ENCOUNTER — Encounter: Payer: Self-pay | Admitting: Family Medicine

## 2024-12-18 ENCOUNTER — Ambulatory Visit: Admitting: Family Medicine

## 2024-12-18 VITALS — BP 126/76 | HR 51 | Temp 97.8°F | Ht 68.0 in | Wt 208.8 lb

## 2024-12-18 DIAGNOSIS — E7800 Pure hypercholesterolemia, unspecified: Secondary | ICD-10-CM | POA: Diagnosis not present

## 2024-12-18 DIAGNOSIS — Z7984 Long term (current) use of oral hypoglycemic drugs: Secondary | ICD-10-CM | POA: Diagnosis not present

## 2024-12-18 DIAGNOSIS — E119 Type 2 diabetes mellitus without complications: Secondary | ICD-10-CM | POA: Diagnosis not present

## 2024-12-18 DIAGNOSIS — I10 Essential (primary) hypertension: Secondary | ICD-10-CM | POA: Diagnosis not present

## 2024-12-18 LAB — COMPREHENSIVE METABOLIC PANEL WITH GFR
ALT: 26 U/L (ref 3–53)
AST: 22 U/L (ref 5–37)
Albumin: 4.3 g/dL (ref 3.5–5.2)
Alkaline Phosphatase: 116 U/L (ref 39–117)
BUN: 21 mg/dL (ref 6–23)
CO2: 28 meq/L (ref 19–32)
Calcium: 9.2 mg/dL (ref 8.4–10.5)
Chloride: 103 meq/L (ref 96–112)
Creatinine, Ser: 1.09 mg/dL (ref 0.40–1.50)
GFR: 65.09 mL/min
Glucose, Bld: 279 mg/dL — ABNORMAL HIGH (ref 70–99)
Potassium: 4.4 meq/L (ref 3.5–5.1)
Sodium: 136 meq/L (ref 135–145)
Total Bilirubin: 1 mg/dL (ref 0.2–1.2)
Total Protein: 6.7 g/dL (ref 6.0–8.3)

## 2024-12-18 LAB — POCT GLYCOSYLATED HEMOGLOBIN (HGB A1C)
HbA1c POC (<> result, manual entry): 7.7 %
HbA1c, POC (controlled diabetic range): 7.7 % — AB (ref 0.0–7.0)
HbA1c, POC (prediabetic range): 7.7 % — AB (ref 5.7–6.4)
Hemoglobin A1C: 7.7 % — AB (ref 4.0–5.6)

## 2024-12-18 LAB — LIPID PANEL
Cholesterol: 101 mg/dL (ref 28–200)
HDL: 43.3 mg/dL
LDL Cholesterol: 38 mg/dL (ref 10–99)
NonHDL: 57.66
Total CHOL/HDL Ratio: 2
Triglycerides: 97 mg/dL (ref 10.0–149.0)
VLDL: 19.4 mg/dL (ref 0.0–40.0)

## 2024-12-18 LAB — MICROALBUMIN / CREATININE URINE RATIO
Creatinine,U: 149.7 mg/dL
Microalb Creat Ratio: 79.2 mg/g — ABNORMAL HIGH (ref 0.0–30.0)
Microalb, Ur: 11.9 mg/dL — ABNORMAL HIGH (ref 0.7–1.9)

## 2024-12-18 MED ORDER — CLOPIDOGREL BISULFATE 75 MG PO TABS: 75.0000 mg | ORAL_TABLET | Freq: Every day | ORAL | 3 refills | Status: AC

## 2024-12-18 NOTE — Progress Notes (Signed)
 OFFICE VISIT  12/18/2024  CC:  Chief Complaint  Patient presents with   Medical Management of Chronic Issues    Patient is a 79 y.o. male who presents for 82-month follow-up diabetes, hyperchol, and hypertension. A/P as of last visit: Diabetes without complication. POC Hba1c today is 6.7%, up from 6.1% last check. We will hold off on restarting metformin .  If A1c continues to rise at next check in 3 months then we will restart it. Monitor renal function today.   2.  Coronary artery disease with recent history of CABG. He finished cardiac rehab.  He feels very well. He is established with Dr. Randine Bihari with [facility]. Not sure how long they will keep him on dual antiplatelet therapy (?  Indefinitely).   3. Hx TIA. Carotid Dopplers when he was in the hospital for his CABG 2 months ago showed Review of hospital records from MI/CABG admission shows carotid Doppler 03/18/2024 that showed 60 to 79% stenosis of the right internal and left internal carotid artery.  He also had postoperative A-fib. ZIO monitoring was essentially normal about 3 months ago. He is on appropriate medical therapy: Atorvastatin  80 mg a day, aspirin 81 mg a day, and Plavix  75 mg a day. Our plan had initially been -->if outpatient monitoring normal then we will refer to vascular surgery.  He wants to put this off at this time and I suspect what vascular surgery would do is continue with medical management at this point.     4.  History of gross hematuria. Urology discovered bladder stones. No intervention was done.  He has no further blood in his urine and has no voiding pain.  The plan is observation.   5.  Mixed hyperlipidemia. He was started on a atorvastatin  80 mg and Zetia  10 mg when he had his MI. LDL was 47 approximately 3 months ago. Repeat lipid panel in 3 months.   6.  Hypertension, well-controlled on losartan  25 mg twice daily, Toprol -XL  INTERIM HX: Wayne Lamb is doing well.  He plays pickle ball 3 days a  week.  His long-term heart monitor back in November 2025 showed increased PVCs, brief NSVT.  Dr. Bihari recommended he increase his Toprol  to 25 mg a day.  He has no chest pain, shortness of breath, dizziness, headaches, changes in lower extremities, or palpitations.  He feels like he is doing well with the avoidance of simple sugars as well as with low-sodium diet.  However he is not sure if his complex carb intake is ideal or not.  Past Medical History:  Diagnosis Date   Arthritis    BPH with obstruction/lower urinary tract symptoms    CAD (coronary artery disease), native coronary artery    S/P inferolateral STEMI/Vfib arrest and Cardiac cath revealed 90% ostial to proximal LAD, 80% mid LAD, 70% D1, 99% OM1 and occluded proximal RCA.  S/P CABG with SVG to PDA, SVG to OM1, SVG to D1 and LIMA to LAD.   Cataract    Diabetes mellitus without complication (HCC)    Dx 05/2023-->fasting gluc 200, Hba1c 7.7%   Diverticulosis    2014 colonoscopy   Elevated PSA    MRI prostate was done 2016 to eval persistently rising PSAs (no biopsy had been done): this showed no sign of prostate ca.  Stable 07/2017, 01/2018, 01/2019, 12/2019, 05/2020.   Encounter for hepatitis C virus screening test for high risk patient    Done by [facility] physicians 01/18/17   Hypercholesterolemia  Leg DVT (deep venous thromboembolism), acute, left (HCC)    11/2022   Lumbar spondylosis    MRI L spine 2005: severe facet deg, SI deg   Obesity, Class II, BMI 35-39.9    OSA on CPAP    compliant   PAF (paroxysmal atrial fibrillation) (HCC)    Postop CABG 2025   Prostate nodule 2016   prostate nodule or ridge in R prostate lobe--w/u reassuring 2016    Pulmonary nodule    11/2022.  F/u imaging stable, no further imaging needed.   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   Sensorineural hearing loss (SNHL) of both ears    [facility] audiology 06/2024-->amplification recommended   Sleep apnea    STEMI (ST elevation  myocardial infarction) (HCC)    Vfib arretst 02/2024-->CABG   Tubular adenoma of colon 2014   recommend repeat colonoscopy 2019    Past Surgical History:  Procedure Laterality Date   COLONOSCOPY     02/14/13 adenoma x 1 ([facility]).  +diverticulosis and int hem   CORONARY ARTERY BYPASS GRAFT     03/10/24 (4 vessel)   FRACTURE SURGERY     INGUINAL HERNIA REPAIR Left    right clavicle fracture     pin inserted (Dr. Deward)   TONSILLECTOMY     TRANSTHORACIC ECHOCARDIOGRAM     12/22/22 normal except grd I DD   ZIO monitor     05/2024 essentially normal    Outpatient Medications Prior to Visit  Medication Sig Dispense Refill   acetaminophen  (TYLENOL ) 500 MG tablet Take 2 tablets by mouth every 6 (six) hours as needed for mild pain (pain score 1-3).     ascorbic acid (VITAMIN C) 500 MG tablet Take 500 mg by mouth daily.     aspirin 81 MG chewable tablet Chew 81 mg by mouth daily.     atorvastatin  (LIPITOR) 80 MG tablet Take 1 tablet (80 mg total) by mouth at bedtime. 90 tablet 3   co-enzyme Q-10 30 MG capsule Take 100 mg by mouth daily.     ezetimibe  (ZETIA ) 10 MG tablet Take 1 tablet (10 mg total) by mouth at bedtime. 90 tablet 3   losartan  (COZAAR ) 25 MG tablet 1 tab po bid 180 tablet 3   metoprolol  succinate (TOPROL  XL) 25 MG 24 hr tablet Take 1 tablet (25 mg total) by mouth daily. 90 tablet 3   omeprazole  (PRILOSEC) 20 MG capsule Take 1 capsule (20 mg total) by mouth every morning. 90 capsule 3   zinc gluconate 50 MG tablet Take 50 mg by mouth daily.     clopidogrel  (PLAVIX ) 75 MG tablet TAKE 1 TABLET BY MOUTH EVERY DAY 30 tablet 1   No facility-administered medications prior to visit.    Allergies[1]  Review of Systems As per HPI  PE:    12/18/2024    9:56 AM 10/11/2024   10:16 AM 09/17/2024    9:52 AM  Vitals with BMI  Height 5' 8 5' 8 5' 8  Weight 208 lbs 13 oz 202 lbs 199 lbs 10 oz  BMI 31.76 30.72 30.36  Systolic 126 122 886  Diastolic 76 70 69  Pulse 51 56 53      Physical Exam  Gen: Alert, well appearing.  Patient is oriented to person, place, time, and situation. AFFECT: pleasant, lucid thought and speech. CV: RRR, no m/r/g.   LUNGS: CTA bilat, nonlabored resps, good aeration in all lung fields. EXT: no clubbing  or cyanosis.  no edema.   LABS:  Last CBC Lab Results  Component Value Date   WBC 5.8 04/03/2024   HGB 12.7 (L) 04/03/2024   HCT 39.1 04/03/2024   MCV 90.3 04/03/2024   MCH 30.0 12/23/2022   RDW 15.3 04/03/2024   PLT 377.0 04/03/2024   Last metabolic panel Lab Results  Component Value Date   GLUCOSE 152 (H) 09/17/2024   NA 140 09/17/2024   K 4.5 09/17/2024   CL 104 09/17/2024   CO2 29 09/17/2024   BUN 19 09/17/2024   CREATININE 1.07 09/17/2024   GFR 66.67 09/17/2024   CALCIUM  9.1 09/17/2024   PROT 7.2 04/03/2024   ALBUMIN 4.3 04/03/2024   BILITOT 0.8 04/03/2024   ALKPHOS 200 (H) 04/03/2024   AST 20 04/03/2024   ALT 17 04/03/2024   ANIONGAP 11 12/20/2022   Last lipids Lab Results  Component Value Date   CHOL 118 06/11/2024   HDL 49.20 06/11/2024   LDLCALC 47 06/11/2024   LDLDIRECT 159.0 08/16/2017   TRIG 106.0 06/11/2024   CHOLHDL 2 06/11/2024   Last hemoglobin A1c Lab Results  Component Value Date   HGBA1C 7.7 (A) 12/18/2024   HGBA1C 7.7 12/18/2024   HGBA1C 7.7 (A) 12/18/2024   HGBA1C 7.7 (A) 12/18/2024   IMPRESSION AND PLAN:  #1 diabetes without complication. Control has worsened some without medication. POC Hba1c today is up to 7.7%. Will restart metformin  500 mg daily. Urine microalbumin/creatinine and renal function test today.  #2 hyperlipidemia in the setting of coronary artery disease. LDL was 47 approximately 6 months ago. He is not fasting today so we will defer lipid panel recheck until next follow-up in 3 months. Monitoring hepatic panel today.  3.  Hypertension, doing well on losartan  25 mg twice daily and Toprol -XL 25 mg a day. Monitor electrolytes and creatinine  today.  An After Visit Summary was printed and given to the patient.  FOLLOW UP: Return in about 3 months (around 03/18/2025) for routine chronic illness f/u.  Signed:  Gerlene Hockey, MD           12/18/2024     [1] No Known Allergies

## 2024-12-19 ENCOUNTER — Ambulatory Visit: Payer: Self-pay | Admitting: Family Medicine

## 2024-12-19 MED ORDER — METFORMIN HCL 500 MG PO TABS: 500.0000 mg | ORAL_TABLET | Freq: Two times a day (BID) | ORAL | Status: AC

## 2024-12-23 ENCOUNTER — Ambulatory Visit: Admitting: Physician Assistant

## 2025-02-12 ENCOUNTER — Ambulatory Visit

## 2025-03-18 ENCOUNTER — Ambulatory Visit: Admitting: Family Medicine

## 2025-05-07 ENCOUNTER — Ambulatory Visit: Admitting: Cardiology
